# Patient Record
Sex: Female | Born: 1965 | Race: Black or African American | Hispanic: No | Smoking: Former smoker
Health system: Southern US, Community
[De-identification: ages and names within clinical notes are randomized; demographics above are authoritative.]

## PROBLEM LIST (undated history)

## (undated) VITALS — BP 108/73 | HR 98 | Temp 98.7°F | Resp 18 | Ht 69.5 in | Wt 324.0 lb

## (undated) DIAGNOSIS — G43909 Migraine, unspecified, not intractable, without status migrainosus: Secondary | ICD-10-CM

## (undated) DIAGNOSIS — T4145XA Adverse effect of unspecified anesthetic, initial encounter: Secondary | ICD-10-CM

## (undated) DIAGNOSIS — R079 Chest pain, unspecified: Secondary | ICD-10-CM

## (undated) DIAGNOSIS — E119 Type 2 diabetes mellitus without complications: Secondary | ICD-10-CM

## (undated) DIAGNOSIS — F419 Anxiety disorder, unspecified: Secondary | ICD-10-CM

## (undated) DIAGNOSIS — J45909 Unspecified asthma, uncomplicated: Secondary | ICD-10-CM

## (undated) DIAGNOSIS — F32A Depression, unspecified: Secondary | ICD-10-CM

## (undated) DIAGNOSIS — F329 Major depressive disorder, single episode, unspecified: Secondary | ICD-10-CM

## (undated) DIAGNOSIS — Z9289 Personal history of other medical treatment: Secondary | ICD-10-CM

## (undated) DIAGNOSIS — T884XXA Failed or difficult intubation, initial encounter: Secondary | ICD-10-CM

## (undated) DIAGNOSIS — T8859XA Other complications of anesthesia, initial encounter: Secondary | ICD-10-CM

## (undated) DIAGNOSIS — G4730 Sleep apnea, unspecified: Secondary | ICD-10-CM

## (undated) DIAGNOSIS — E785 Hyperlipidemia, unspecified: Secondary | ICD-10-CM

## (undated) DIAGNOSIS — R87619 Unspecified abnormal cytological findings in specimens from cervix uteri: Secondary | ICD-10-CM

## (undated) DIAGNOSIS — I10 Essential (primary) hypertension: Secondary | ICD-10-CM

## (undated) HISTORY — DX: Anxiety disorder, unspecified: F41.9

## (undated) HISTORY — DX: Depression, unspecified: F32.A

## (undated) HISTORY — DX: Essential (primary) hypertension: I10

## (undated) HISTORY — DX: Unspecified abnormal cytological findings in specimens from cervix uteri: R87.619

## (undated) HISTORY — DX: Major depressive disorder, single episode, unspecified: F32.9

## (undated) HISTORY — PX: OTHER SURGICAL HISTORY: SHX169

## (undated) HISTORY — DX: Chest pain, unspecified: R07.9

## (undated) HISTORY — DX: Type 2 diabetes mellitus without complications: E11.9

## (undated) HISTORY — PX: KNEE SURGERY: SHX244

## (undated) HISTORY — PX: TONSILLECTOMY: SUR1361

---

## 1999-02-22 ENCOUNTER — Emergency Department (HOSPITAL_COMMUNITY): Admission: EM | Admit: 1999-02-22 | Discharge: 1999-02-22 | Payer: Self-pay | Admitting: Emergency Medicine

## 2001-12-10 ENCOUNTER — Encounter: Payer: Self-pay | Admitting: Emergency Medicine

## 2001-12-10 ENCOUNTER — Emergency Department (HOSPITAL_COMMUNITY): Admission: EM | Admit: 2001-12-10 | Discharge: 2001-12-10 | Payer: Self-pay | Admitting: Emergency Medicine

## 2001-12-20 ENCOUNTER — Other Ambulatory Visit: Admission: RE | Admit: 2001-12-20 | Discharge: 2001-12-20 | Payer: Self-pay | Admitting: Family Medicine

## 2001-12-28 ENCOUNTER — Ambulatory Visit (HOSPITAL_COMMUNITY): Admission: RE | Admit: 2001-12-28 | Discharge: 2001-12-28 | Payer: Self-pay | Admitting: Family Medicine

## 2003-06-07 ENCOUNTER — Encounter: Admission: RE | Admit: 2003-06-07 | Discharge: 2003-06-26 | Payer: Self-pay | Admitting: Family Medicine

## 2003-07-11 ENCOUNTER — Emergency Department (HOSPITAL_COMMUNITY): Admission: EM | Admit: 2003-07-11 | Discharge: 2003-07-12 | Payer: Self-pay | Admitting: Emergency Medicine

## 2003-10-17 ENCOUNTER — Other Ambulatory Visit: Admission: RE | Admit: 2003-10-17 | Discharge: 2003-10-17 | Payer: Self-pay | Admitting: Family Medicine

## 2006-11-05 ENCOUNTER — Other Ambulatory Visit: Admission: RE | Admit: 2006-11-05 | Discharge: 2006-11-05 | Payer: Self-pay | Admitting: Family Medicine

## 2007-02-03 ENCOUNTER — Ambulatory Visit (HOSPITAL_COMMUNITY): Admission: RE | Admit: 2007-02-03 | Discharge: 2007-02-03 | Payer: Self-pay | Admitting: Obstetrics and Gynecology

## 2007-02-19 ENCOUNTER — Ambulatory Visit (HOSPITAL_COMMUNITY): Admission: RE | Admit: 2007-02-19 | Discharge: 2007-02-19 | Payer: Self-pay | Admitting: Family Medicine

## 2007-03-12 ENCOUNTER — Ambulatory Visit (HOSPITAL_COMMUNITY): Admission: RE | Admit: 2007-03-12 | Discharge: 2007-03-12 | Payer: Self-pay | Admitting: Obstetrics and Gynecology

## 2008-01-20 ENCOUNTER — Other Ambulatory Visit: Admission: RE | Admit: 2008-01-20 | Discharge: 2008-01-20 | Payer: Self-pay | Admitting: Family Medicine

## 2009-06-26 ENCOUNTER — Ambulatory Visit: Payer: Self-pay | Admitting: Gynecology

## 2009-10-07 IMAGING — CR DG CHEST 1V PORT
1 series · 1 of 1 positions shown · non-contrast
Comparison: none

CLINICAL DATA: Postop from hysterectomy.  Hypoxia. 
 PORTABLE CHEST ? 1 VIEW:

[view not recorded]
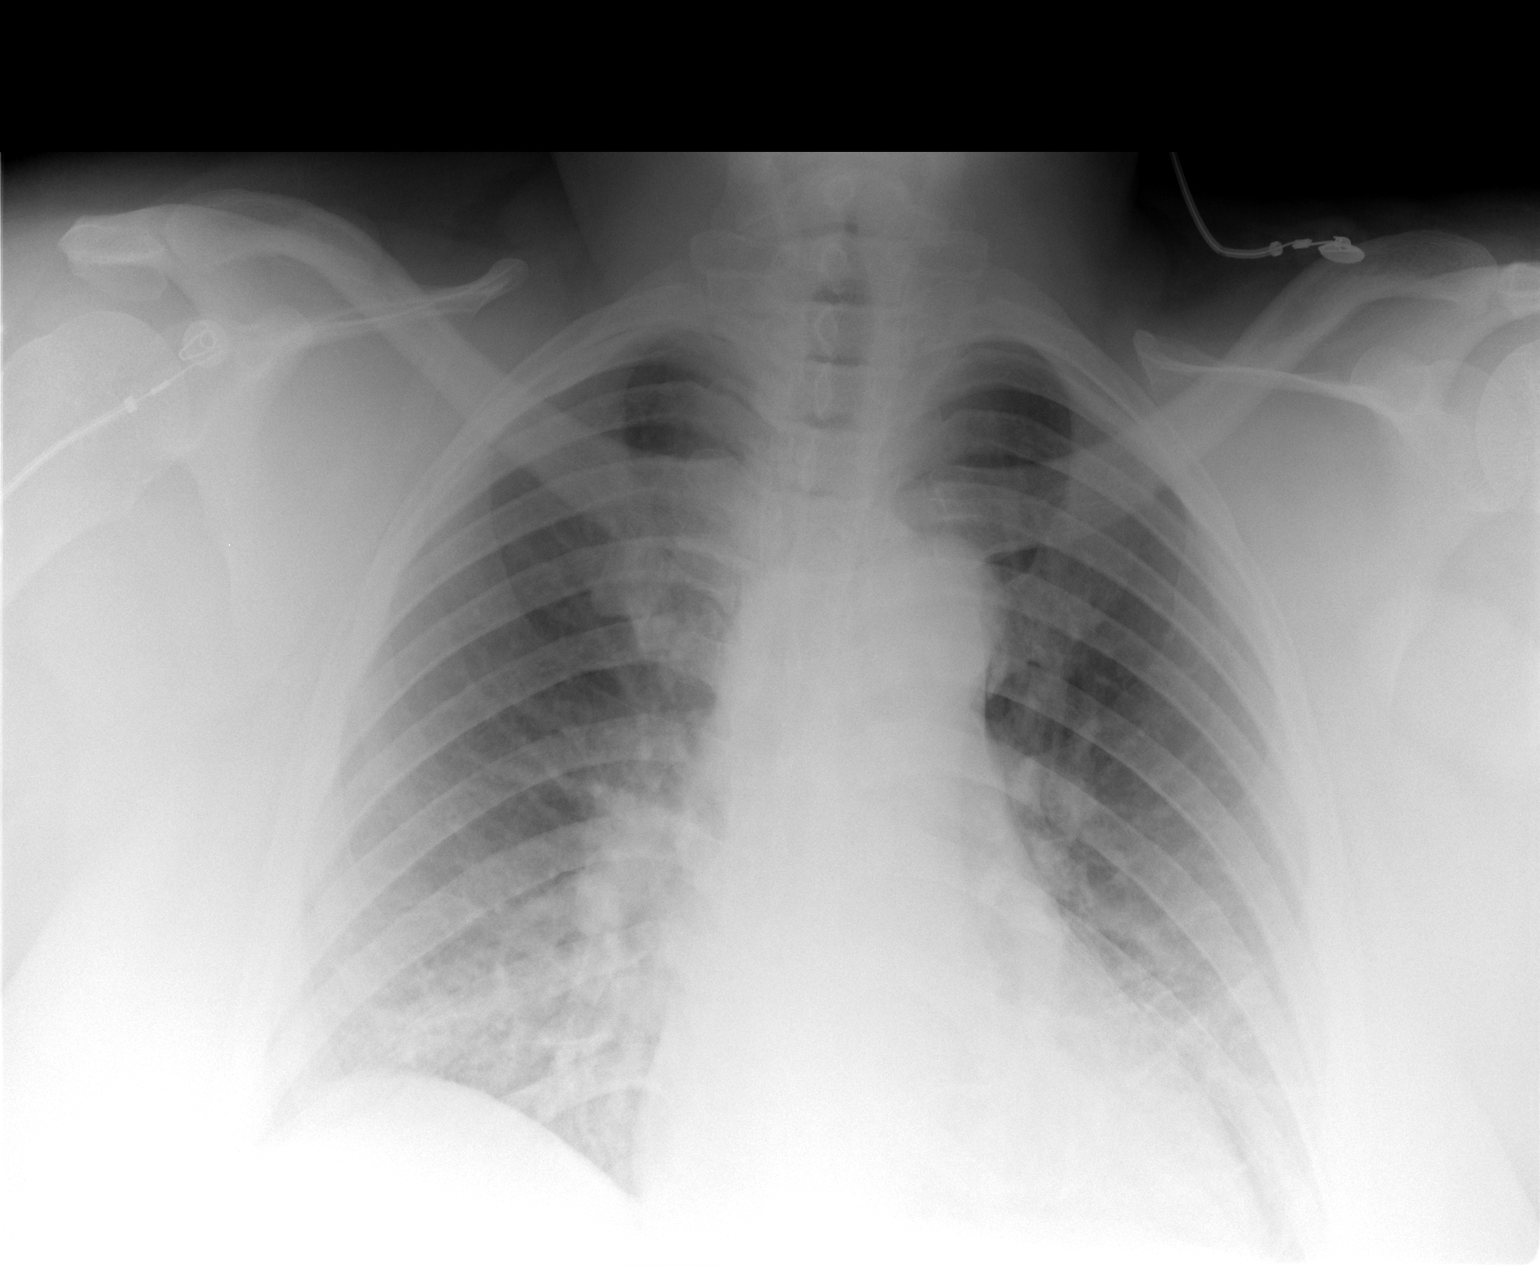

[1 of 1 positions shown; findings below may reference images not displayed]

FINDINGS: Interstitial infiltrates are seen bilaterally, mainly in the lower lung zones, suspicious for mild pulmonary edema.  There is also mild bibasilar atelectasis.  Heart size is within normal limits allowing for portable technique.
IMPRESSION: Mild interstitial edema.  Mild bibasilar atelectasis.

## 2010-02-03 ENCOUNTER — Encounter: Payer: Self-pay | Admitting: Family Medicine

## 2010-04-22 ENCOUNTER — Emergency Department (HOSPITAL_COMMUNITY)
Admission: EM | Admit: 2010-04-22 | Discharge: 2010-04-22 | Disposition: A | Discharge status: Home / Self Care | Payer: 59 | Attending: Emergency Medicine | Admitting: Emergency Medicine

## 2010-04-22 ENCOUNTER — Emergency Department (HOSPITAL_COMMUNITY): Payer: 59

## 2010-04-22 DIAGNOSIS — Z79899 Other long term (current) drug therapy: Secondary | ICD-10-CM | POA: Insufficient documentation

## 2010-04-22 DIAGNOSIS — E119 Type 2 diabetes mellitus without complications: Secondary | ICD-10-CM | POA: Insufficient documentation

## 2010-04-22 DIAGNOSIS — I10 Essential (primary) hypertension: Secondary | ICD-10-CM | POA: Insufficient documentation

## 2010-04-22 DIAGNOSIS — R079 Chest pain, unspecified: Secondary | ICD-10-CM | POA: Insufficient documentation

## 2010-04-22 DIAGNOSIS — R0602 Shortness of breath: Secondary | ICD-10-CM | POA: Insufficient documentation

## 2010-04-22 DIAGNOSIS — K219 Gastro-esophageal reflux disease without esophagitis: Secondary | ICD-10-CM | POA: Insufficient documentation

## 2010-04-22 LAB — BASIC METABOLIC PANEL
BUN: 6 mg/dL (ref 6–23)
CO2: 29 mEq/L (ref 19–32)
Chloride: 100 mEq/L (ref 96–112)
Potassium: 3.9 mEq/L (ref 3.5–5.1)

## 2010-04-22 LAB — DIFFERENTIAL
Basophils Absolute: 0 10*3/uL (ref 0.0–0.1)
Basophils Relative: 1 % (ref 0–1)
Eosinophils Absolute: 0.2 10*3/uL (ref 0.0–0.7)
Eosinophils Relative: 2 % (ref 0–5)
Lymphocytes Relative: 46 % (ref 12–46)
Lymphs Abs: 3.2 10*3/uL (ref 0.7–4.0)
Monocytes Absolute: 0.5 10*3/uL (ref 0.1–1.0)
Monocytes Relative: 7 % (ref 3–12)
Neutro Abs: 3 10*3/uL (ref 1.7–7.7)
Neutrophils Relative %: 44 % (ref 43–77)

## 2010-04-22 LAB — BRAIN NATRIURETIC PEPTIDE: Pro B Natriuretic peptide (BNP): <30 pg/mL (ref 0.0–100.0)

## 2010-04-22 LAB — CBC
HCT: 41 % (ref 36.0–46.0)
Hemoglobin: 13.2 g/dL (ref 12.0–15.0)
MCH: 27.4 pg (ref 26.0–34.0)
MCHC: 32.2 g/dL (ref 30.0–36.0)
MCV: 85.1 fL (ref 78.0–100.0)
Platelets: 260 10*3/uL (ref 150–400)
RBC: 4.82 MIL/uL (ref 3.87–5.11)
RDW: 14.5 % (ref 11.5–15.5)
WBC: 6.9 10*3/uL (ref 4.0–10.5)

## 2010-04-22 LAB — POCT CARDIAC MARKERS: Troponin i, poc: <0.05 ng/mL (ref 0.00–0.09)

## 2010-05-28 NOTE — Op Note (Signed)
Nicole Hubbard, Nicole Hubbard NO.:  [id number]   MEDICAL RECORD NO.:  [id number]          PATIENT TYPE:  AMB   LOCATION:  SDC                           [facility]   PHYSICIAN:  Gerald Leitz, MD          DATE OF BIRTH:  Dec 04, 1965   DATE OF PROCEDURE:  03/12/2007  DATE OF DISCHARGE:  02/19/2007                               OPERATIVE REPORT   PREOPERATIVE DIAGNOSIS:  Menorrhagia.   POSTOPERATIVE DIAGNOSIS:  Menorrhagia.   PROCEDURE:  Hysteroscopy, NovaSure endometrial ablation.   SURGEON:  Gerald Leitz, M.D.   ASSISTANT:  None.   ANESTHESIA:  General.   FINDINGS:  Normal appearing uterine cavity.   SPECIMENS:  None.   ESTIMATED BLOOD LOSS:  Minimal.   LR DEFICIT:  100 mL.   COMPLICATIONS:  None   PROCEDURE IN DETAIL:  Informed consent was obtained and the patient was  taken to the operating room where she was placed under general  anesthesia.  She was then placed in the dorsal lithotomy position,  prepped and draped in the usual sterile fashion.  A bivalve speculum was  placed into the vaginal vault and the anterior lip of the cervix was  grasped with a single tooth tenaculum.  The uterus was sounded to 9 cm.  Endocervical canal length was 3 cm for a cavity length of 6 cm. The  hysteroscope was then inserted with the findings noted above.  The  hysteroscope was removed and the cervix was dilated to approximately 8  mm.  The NovaSure apparatus was set at 6 cm.  The NovaSure apparatus was  inserted into the cervix and endometrial cavity.  The NovaSure array was  deployed.  It was then seated. Cavity width was noted to be 3.5 cm.  CO2  cavity assessment test was performed and passed. Ablation was performed  for 1 minute 45 seconds at a power of 116. The NovaSure apparatus was  removed from the endometrial cavity.  The hysteroscope was inserted and  the endometrial cavity appeared adequately ablated.  0.25% Marcaine were  injected, 5 mL, at the 5 o'clock position  and 7 o'clock position for  postop anesthesia.  The single  tooth sac was removed from the anterior lip of the cervix.  The fascia  was removed from the vagina.  The patient was awakened from anesthesia  and taken to the recovery room awake and in stable condition.  Sponge,  lap, and needle counts were correct x2.      Gerald Leitz, MD  Electronically Signed     TC/MEDQ  D:  03/12/2007  T:  03/12/2007  Job:  [id number]

## 2010-05-28 NOTE — H&P (Signed)
NAMEBERNISHA, Nicole Hubbard NO.:  [id number]   MEDICAL RECORD NO.:  [id number]          PATIENT TYPE:  AMB   LOCATION:  SDC                           [facility]   PHYSICIAN:  Gerald Leitz, MD          DATE OF BIRTH:  09/03/1965   DATE OF ADMISSION:  02/19/2007  DATE OF DISCHARGE:  02/19/2007                              HISTORY & PHYSICAL   DATE OF SURGERY:  March 12, 2007   HISTORY OF PRESENT ILLNESS:  This is a 45 year old G2, P1-0-1-1 with  menorrhagia who desires therapy via endometrial ablation.  She had an  endometrial biopsy performed on November 05, 2006 which showed  nonsecretory endometrium with breakdown.  There was no hyperplasia or  malignancy identified.  The patient was originally scheduled for surgery  on February 03, 2007.  She had problems with anesthesia during that  visit, and therefore the ablation was cancelled until further evaluation  could be performed.  She underwent pulmonary function testing on  February 19, 2007.  She had normal at spirometry.   PAST OB HISTORY:  Spontaneous vaginal delivery x1, SAB x1.   PAST GYN HISTORY:  Menarche at the age of 39.  Contraception:  None.  No  history of sexually transmitted diseases.  Last Pap smear was in October  of 2008.  This was normal.   PAST MEDICAL HISTORY:  1. Morbid obesity.  2. Hypertension.  3. Depression.  4. Diabetes.  5. Sleep apnea.   PAST SURGICAL HISTORY:  1. Knee surgery in 2007.  2. Tonsillectomy.   MEDICATIONS:  Diovan, fluoxetine, Norvasc, metformin 500 mg b.i.d.   ALLERGIES:  NO KNOWN DRUG ALLERGIES.   SOCIAL HISTORY:  The patient works for [employer].  Previous history of  tobacco use.  She quit 7 months ago.  Social alcohol use.  No illicit  drug use.   FAMILY HISTORY:  Negative for breast, ovarian, or colon cancer.   REVIEW OF SYSTEMS:  Negative, except as stated in history of current  illness.   PHYSICAL EXAMINATION:  VITAL SIGNS:  Blood pressure 140/90, height 6  feet 9 inches, weight 377.5 pounds.  CARDIOVASCULAR:  Regular rate and rhythm.  LUNGS:  Clear to auscultation bilaterally.  ABDOMEN:  Soft, nontender, no masses.  PELVIC:  Normal external female genitalia.  No vulvar, vaginal, or  cervical lesions noted.  Bimanual exam is difficult due to the patient's  body habitus.  No gross masses are appreciated.  EXTREMITIES:  No clubbing, cyanosis, or edema.   Pelvic ultrasound performed on November 18, 2006 shows the uterus is  measured at 9.9 cm x 4.7 cm x 6.6 cm.   IMPRESSION AND PLAN:  Menorrhagia.  The patient desires treatment with  endometrial ablation.  Risks, benefits, alternatives to hysteroscopy,  D&C, and ablation were discussed with the patient including but not  limited to infection, bleeding, possible damage to surrounding organs,  uterine perforation with the need for further surgery.  The patient  understands the risks and desires to proceed with hysteroscopy, D&C, and  endometrial ablation.  Gerald Leitz, MD  Electronically Signed     TC/MEDQ  D:  03/11/2007  T:  03/11/2007  Job:  [id number]

## 2010-05-28 NOTE — H&P (Signed)
Nicole Hubbard, Nicole Hubbard NO.:  [id number]   MEDICAL RECORD NO.:  [id number]          PATIENT TYPE:  AMB   LOCATION:  SDC                           [facility]   PHYSICIAN:  Gerald Leitz, MD          DATE OF BIRTH:  1965/05/21   DATE OF ADMISSION:  02/03/2007  DATE OF DISCHARGE:                              HISTORY & PHYSICAL   HISTORY OF PRESENT ILLNESS:  This is a 45 year old G __________ , P1-0-1-  1 with menorrhagia who desires therapy via endometrial ablation.  She  had an endometrial biopsy performed on October 23 which showed  nonsecretory endometrium with breakdown.  There was no hyperplasia or  malignancy identified.   PAST OB HISTORY:  1. Spontaneous vaginal delivery x1.  2. SAB x1.   __________  Menarche at the age of 11.   CONTRACEPTION:  None.   No history of sexually transmitted diseases.   PAST MEDICAL HISTORY:  1. Hypertension.  2. Depression.   PAST SURGICAL HISTORY:  1. Knee surgery in 2000.  2. Tonsillectomy.   MEDICATIONS:  Diovan, fluoxetine, and Norvasc.   ALLERGIES:  No known drug allergies.   SOCIAL HISTORY:  Patient works for [employer].  Previous history of tobacco  use.  She quit six months ago.  Social alcohol use.  No illicit drug  use.   FAMILY HISTORY:  Negative for breast, ovarian, and colon cancer.   REVIEW OF SYSTEMS:  Negative except as stated in the history of current  illness.   PHYSICAL EXAMINATION:  Blood pressure 130/90.  Weight 341 pounds.  Height 69 inches.  CARDIOVASCULAR:  Regular rate and rhythm.  LUNGS:  Clear to auscultation bilaterally.  ABDOMEN:  Soft, nontender.  No mass.  PELVIC:  Normal external female genitalia.  No vulvar, vaginal, or  cervical lesions noted.  Bimanual exam is difficult due to patient's  body habitus.  No gross masses are appreciated.  EXTREMITIES:  No clubbing, cyanosis or edema.   Pelvic ultrasound performed on November 18, 2006 shows the uterus to  measure 9.9 x 4.7 x  6.6  cm.   IMPRESSION/PLAN:  Menorrhagia:  Patient desires treatment with  endometrial ablation.  The risks, benefits and alternatives of  hysteroscopy, D&C, and ablation were discussed with the patient,  including but not limited to infection, bleeding, damage, possible  uterine perforation with need for further surgery.  Patient understands  the risks and desires to proceed with hysteroscopy, D&C, and endometrial  ablation.      Gerald Leitz, MD  Electronically Signed     TC/MEDQ  D:  01/29/2007  T:  01/29/2007  Job:  [id number]

## 2010-10-03 LAB — BASIC METABOLIC PANEL
BUN: 9
Chloride: 98
Potassium: 3.4 — ABNORMAL LOW

## 2010-10-03 LAB — URINALYSIS, ROUTINE W REFLEX MICROSCOPIC
Bilirubin Urine: NEGATIVE
Glucose, UA: NEGATIVE
Ketones, ur: NEGATIVE
Protein, ur: NEGATIVE

## 2010-10-03 LAB — CBC
HCT: 38.2
MCV: 84.3
Platelets: 331
RBC: 4.53
WBC: 7.5

## 2010-10-03 LAB — DIFFERENTIAL
Eosinophils Absolute: 0.2
Eosinophils Relative: 3
Lymphs Abs: 3.1
Monocytes Relative: 9

## 2010-10-04 LAB — BASIC METABOLIC PANEL
CO2: 30
Chloride: 99
GFR calc Af Amer: >60
Sodium: 138

## 2010-10-04 LAB — URINALYSIS, ROUTINE W REFLEX MICROSCOPIC
Glucose, UA: NEGATIVE
Hgb urine dipstick: NEGATIVE
Protein, ur: NEGATIVE
pH: 6

## 2010-10-04 LAB — CBC
Hemoglobin: 13.1
MCHC: 33.5
MCV: 84
RBC: 4.64

## 2010-10-04 LAB — PREGNANCY, URINE: Preg Test, Ur: NEGATIVE

## 2010-12-29 ENCOUNTER — Ambulatory Visit: Payer: 59

## 2010-12-29 DIAGNOSIS — F4321 Adjustment disorder with depressed mood: Secondary | ICD-10-CM

## 2010-12-29 DIAGNOSIS — J301 Allergic rhinitis due to pollen: Secondary | ICD-10-CM

## 2011-01-27 ENCOUNTER — Encounter: Payer: Self-pay | Admitting: Family Medicine

## 2011-02-10 ENCOUNTER — Encounter: Payer: 59 | Admitting: Family Medicine

## 2011-04-24 ENCOUNTER — Other Ambulatory Visit: Payer: Self-pay | Admitting: *Deleted

## 2011-04-25 ENCOUNTER — Encounter: Payer: Self-pay | Admitting: Family Medicine

## 2011-06-17 ENCOUNTER — Telehealth: Payer: Self-pay

## 2011-06-17 NOTE — Telephone Encounter (Addendum)
PT WOULD LIKE TO SPEAK WITH DR COPLAND REGARDING HER FMLA PAPERS. STATES SHE WAS GIVEN TIME OUT OF WORK FOR DR'S APPT, HOWEVER SHE IS SEEING A COUNSELOR ON LINE THAT LIVE IN [place]. WOULD LIKE DR COPLAND TO GIVE HER THAT TIME ALSO. PLEASE CALL [phone]   PT CALLED AGAIN TODAY AND STATED SHE HADN'T HEARD FROM DR COPLAND AND WOULD LIKE TO HEAR SOMETHING BEFORE THE DAY IS OVER PLEASE CALL [phone]

## 2011-06-18 NOTE — Telephone Encounter (Signed)
Called pt and let her know that Dr Patsy Lager will not be back in office until Monday, and we will be back in touch w/her after she has a chance to review message. Pt agreed.

## 2011-06-19 NOTE — Telephone Encounter (Signed)
Chart in your box 

## 2011-06-19 NOTE — Telephone Encounter (Signed)
Pt CB and reports that the only thing that is still needed on the FMLA papers about her counseling w/Michelle is the last office visit date when she last saw Marcelino Duster in person here in [place] bf she moved to [facility]. Dr Patsy Lager has already put on the forms that she has been continuing counseling online. The last OV date with Marcelino Duster was on 02/25/11.

## 2011-06-19 NOTE — Telephone Encounter (Signed)
I will need to look at her chart because I don't think we have seen here in a few months at least.  (no notes on EPIC).  If she can tell me exactly what times (or how much time) she needs to do her online concealing I can update her paperwork for her.    Medical records- please pull this chart for me!  thanks

## 2011-06-19 NOTE — Telephone Encounter (Signed)
LMOM for pt to CB. Ask pt Dr Cyndie Chime question about what times or how much time is needed for her online counseling and then forward message back to Dr Patsy Lager.

## 2011-06-24 ENCOUNTER — Telehealth: Payer: Self-pay

## 2011-09-15 ENCOUNTER — Other Ambulatory Visit: Payer: Self-pay | Admitting: Family Medicine

## 2011-09-15 NOTE — Telephone Encounter (Signed)
Please pull paper chart.  

## 2011-09-16 NOTE — Telephone Encounter (Signed)
Chart pulled to PA pool at nurse station [phone]

## 2012-01-26 ENCOUNTER — Ambulatory Visit: Payer: 59 | Admitting: Family Medicine

## 2012-01-26 VITALS — BP 162/112 | HR 91 | Temp 99.0°F | Resp 18 | Ht 69.0 in | Wt 333.0 lb

## 2012-01-26 DIAGNOSIS — E119 Type 2 diabetes mellitus without complications: Secondary | ICD-10-CM

## 2012-01-26 DIAGNOSIS — F329 Major depressive disorder, single episode, unspecified: Secondary | ICD-10-CM

## 2012-01-26 DIAGNOSIS — E669 Obesity, unspecified: Secondary | ICD-10-CM

## 2012-01-26 DIAGNOSIS — I10 Essential (primary) hypertension: Secondary | ICD-10-CM | POA: Insufficient documentation

## 2012-01-26 DIAGNOSIS — N760 Acute vaginitis: Secondary | ICD-10-CM

## 2012-01-26 DIAGNOSIS — Z0000 Encounter for general adult medical examination without abnormal findings: Secondary | ICD-10-CM

## 2012-01-26 DIAGNOSIS — Z01419 Encounter for gynecological examination (general) (routine) without abnormal findings: Secondary | ICD-10-CM

## 2012-01-26 DIAGNOSIS — R300 Dysuria: Secondary | ICD-10-CM

## 2012-01-26 DIAGNOSIS — F32A Depression, unspecified: Secondary | ICD-10-CM | POA: Insufficient documentation

## 2012-01-26 DIAGNOSIS — E7800 Pure hypercholesterolemia, unspecified: Secondary | ICD-10-CM

## 2012-01-26 DIAGNOSIS — Z23 Encounter for immunization: Secondary | ICD-10-CM

## 2012-01-26 DIAGNOSIS — B9689 Other specified bacterial agents as the cause of diseases classified elsewhere: Secondary | ICD-10-CM

## 2012-01-26 LAB — POCT URINALYSIS DIPSTICK
Bilirubin, UA: NEGATIVE
Ketones, UA: NEGATIVE
Leukocytes, UA: NEGATIVE
Nitrite, UA: NEGATIVE
Protein, UA: NEGATIVE
pH, UA: 5.5

## 2012-01-26 LAB — POCT CBC
Granulocyte percent: 41.7 %G (ref 37–80)
Lymph, poc: 4 — AB (ref 0.6–3.4)
MCH, POC: 27 pg (ref 27–31.2)
MCHC: 30.3 g/dL — AB (ref 31.8–35.4)
MCV: 88.9 fL (ref 80–97)
MID (cbc): 0.5 (ref 0–0.9)
POC LYMPH PERCENT: 52.3 %L — AB (ref 10–50)
Platelet Count, POC: 288 10*3/uL (ref 142–424)
RDW, POC: 15 %

## 2012-01-26 LAB — POCT WET PREP WITH KOH

## 2012-01-26 LAB — POCT UA - MICROSCOPIC ONLY: Yeast, UA: NEGATIVE

## 2012-01-26 MED ORDER — DESVENLAFAXINE SUCCINATE ER 50 MG PO TB24: 50.0000 mg | ORAL_TABLET | Freq: Every day | ORAL | Status: DC

## 2012-01-26 MED ORDER — LOSARTAN POTASSIUM-HCTZ 100-25 MG PO TABS: 1.0000 | ORAL_TABLET | Freq: Every day | ORAL | Status: DC

## 2012-01-26 MED ORDER — AMLODIPINE BESYLATE 10 MG PO TABS: 10.0000 mg | ORAL_TABLET | Freq: Every day | ORAL | Status: DC

## 2012-01-26 MED ORDER — SIMVASTATIN 10 MG PO TABS: 10.0000 mg | ORAL_TABLET | Freq: Every day | ORAL | Status: DC

## 2012-01-26 MED ORDER — METRONIDAZOLE 500 MG PO TABS: 500.0000 mg | ORAL_TABLET | Freq: Two times a day (BID) | ORAL | Status: DC

## 2012-01-26 MED ORDER — METFORMIN HCL 500 MG PO TABS: 500.0000 mg | ORAL_TABLET | Freq: Two times a day (BID) | ORAL | Status: DC

## 2012-01-26 NOTE — Progress Notes (Signed)
[facility] [address], [place] [address] [phone]  Date:  01/26/2012   Name:  Nicole Hubbard   DOB:  March 21, 1965   MRN:  [id number]  PCP:  Abbe Amsterdam, MD    Chief Complaint: Annual Exam   History of Present Illness:  Nicole Hubbard is a 47 y.o. very pleasant female patient who presents with the following:  History of HTN, DM and depression.  Here for a CPE today.  Last seen here December 2012.  She has not been keeping up with her health maintenance as of late.   She has checked off 19 positives on her ROS.    She states that nothing is really new since we saw each other last.  She states that she is still depressed.  She restarted smoking about 6 months ago.  She usually smokes after work to help deal with stress.  Sometimes she smokes to help herself avoid drinking.  She is drinking mostly on the weekends- she might drink 2 bottles of wine on Friday and sat nights (each), and then a glass or two on weekdays.    She ran out of all of her medications a couple of weeks ago.  She is supposed to be on norvasc and hyzaar for BP control.  She would like to try something different for her depression- she has tried wellbutrin and prozac alternatively.    She would like to have a pap today.  She had an ASCUS pap and positive HRHPV in 2011 and was sent to [facility] for colpo, and they recommended paps every 6 months.  If any high grade changes will need repeat colpo.   She has not had a mammogram in some time either- last mammo I found is from 2009  She does not have any suicidal thoughts or plans, but admits that she does not care about herself as much as she should.  She had been seeing a Veterinary surgeon but she moved away to [place].   She would like to get back into counseling.    Hira wants to reinstate her FMLA leave- she had used this for depression in the past.    Also needs a flu shot today There is no problem list on file for this patient.   Past  Medical History  Diagnosis Date  . Depression   . Diabetes mellitus without complication   . Hypertension     Past Surgical History  Procedure Date  . Knee surgery     History  Substance Use Topics  . Smoking status: Current Every Day Smoker  . Smokeless tobacco: Not on file  . Alcohol Use: Yes    Family History  Problem Relation Age of Onset  . Cancer Father     No Known Allergies  Medication list has been reviewed and updated.  Current Outpatient Prescriptions on File Prior to Visit  Medication Sig Dispense Refill  . amLODipine (NORVASC) 10 MG tablet Take 10 mg by mouth daily.      Marland Kitchen buPROPion (WELLBUTRIN SR) 150 MG 12 hr tablet TAKE 1 TABLET BY MOUTH DAILY FOR 3 DAYS ONE TABLET BY MOUTH TWICEDAILY  60 tablet  0  . metFORMIN (GLUCOPHAGE) 500 MG tablet Take 500 mg by mouth 2 (two) times daily with a meal.      . simvastatin (ZOCOR) 10 MG tablet Take 10 mg by mouth at bedtime.        Review of Systems:  As per HPI-  otherwise negative.   Physical Examination: Filed Vitals:   01/26/12 1349  BP: 162/112  Pulse: 91  Temp: 99 F (37.2 C)  Resp: 18   Filed Vitals:   01/26/12 1349  Height: 5\' 9"  (1.753 m)  Weight: 333 lb (151.048 kg)   Body mass index is 49.18 kg/(m^2). Ideal Body Weight: Weight in (lb) to have BMI = 25: 168.9   GEN: WDWN, NAD, Non-toxic, A & O x 3, morbid obesity HEENT: Atraumatic, Normocephalic. Neck supple. No masses, No LAD. Bilateral TM wnl, oropharynx normal.  PEERL,EOMI.   Ears and Nose: No external deformity. CV: RRR, No M/G/R. No JVD. No thrill. No extra heart sounds. PULM: CTA B, no wheezes, crackles, rhonchi. No retractions. No resp. distress. No accessory muscle use. ABD: S, NT, ND, +BS. No rebound. No HSM. EXTR: No c/c/e NEURO Normal gait.  PSYCH: Normally interactive. Conversant. Not depressed or anxious appearing.  Calm demeanor.  Breast exam: no masses or discharge GU:  Normal internal and external exam except for fishy  odor.  No CMT or adnexal masses or tenderness.   Results for orders placed in visit on 01/26/12  POCT CBC      Component Value Range   WBC 7.7  4.6 - 10.2 K/uL   Lymph, poc 4.0 (*) 0.6 - 3.4   POC LYMPH PERCENT 52.3 (*) 10 - 50 %L   MID (cbc) 0.5  0 - 0.9   POC MID % 6.0  0 - 12 %M   POC Granulocyte 3.2  2 - 6.9   Granulocyte percent 41.7  37 - 80 %G   RBC 5.26  4.04 - 5.48 M/uL   Hemoglobin 14.2  12.2 - 16.2 g/dL   HCT, POC 16.1  09.6 - 47.9 %   MCV 88.9  80 - 97 fL   MCH, POC 27.0  27 - 31.2 pg   MCHC 30.3 (*) 31.8 - 35.4 g/dL   RDW, POC 04.5     Platelet Count, POC 288  142 - 424 K/uL   MPV 12.2  0 - 99.8 fL  POCT GLYCOSYLATED HEMOGLOBIN (HGB A1C)      Component Value Range   Hemoglobin A1C 6.8    POCT WET PREP WITH KOH      Component Value Range   Trichomonas, UA Negative     Clue Cells Wet Prep HPF POC 3-4     Epithelial Wet Prep HPF POC 3-6     Yeast Wet Prep HPF POC neg     Bacteria Wet Prep HPF POC trace     RBC Wet Prep HPF POC 0-1     WBC Wet Prep HPF POC 0-1     KOH Prep POC Negative    POCT UA - MICROSCOPIC ONLY      Component Value Range   WBC, Ur, HPF, POC 0-2     RBC, urine, microscopic 0-1     Bacteria, U Microscopic trace     Mucus, UA neg     Epithelial cells, urine per micros 2-4     Crystals, Ur, HPF, POC neg     Casts, Ur, LPF, POC neg     Yeast, UA neg    POCT URINALYSIS DIPSTICK      Component Value Range   Color, UA yellow     Clarity, UA clear     Glucose, UA neg     Bilirubin, UA neg     Ketones, UA neg  Spec Grav, UA 1.025     Blood, UA neg     pH, UA 5.5     Protein, UA neg     Urobilinogen, UA 0.2     Nitrite, UA neg     Leukocytes, UA Negative      Assessment and Plan: 1. Physical exam, routine  POCT CBC, TSH, Pap IG, CT/NG w/ reflex HPV when ASC-U  2. Depression  desvenlafaxine (PRISTIQ) 50 MG 24 hr tablet  3. Diabetes mellitus, type 2  POCT glycosylated hemoglobin (Hb A1C), Comprehensive metabolic panel, Lipid panel,  metFORMIN (GLUCOPHAGE) 500 MG tablet, simvastatin (ZOCOR) 10 MG tablet  4. HTN (hypertension)  Comprehensive metabolic panel, losartan-hydrochlorothiazide (HYZAAR) 100-25 MG per tablet, amLODipine (NORVASC) 10 MG tablet  5. Obesity  simvastatin (ZOCOR) 10 MG tablet  6. Dysuria  POCT Wet Prep with KOH, POCT UA - Microscopic Only, POCT urinalysis dipstick  7. High cholesterol  simvastatin (ZOCOR) 10 MG tablet  8. BV (bacterial vaginosis)  metroNIDAZOLE (FLAGYL) 500 MG tablet   Completed PE today, await pap and other labs as above.  Flu shot, other immunizations UTD DM: control is ok.   Will start pristiq for depression.  She does not have any suicidal ideas, but is not happy with her mood or current lifestyle. Admits that she is drinking and smoking to deal with her depression- she is dedicated to stopping drinking.  She plans to look again at counseling through her work.  She may go back on FMLA for a few months- this will allow her some time out of work to attend counseling Restart both BP medications Discussed bariatric surgery- she is interested in this and will look into the informational session at [facility].  UA is reassuring Restart zocor, await FLP Gave rx for flagyl for BV.  However, she has a drinking problem at this time.  Advised her to not fill this rx as of yet.  Will await her pap result and wait for a time when she is not drinking as she does not have any symptoms.    Will plan further follow- up pending labs.   Abbe Amsterdam, MD

## 2012-01-26 NOTE — Patient Instructions (Addendum)
If you are interested in looking at medical weight loss or bariatric surgery, call and set up a time to attend the bariatric seminar through [facility]:  We congratulate you for exploring weight-loss surgery as an option for better health. Choosing to undergo a bariatric procedure is a big decision. We want you to have all the information you need to evaluate the advantages and obligations of this life-changing transition.  You can join the thousands of [facility] patients who have reached their weight-loss goals. The next step is simple: Register to attend our free seminar.  Register today online or contact [facility] at [phone] to sign up for our next Bariatric Surgery Options Seminar.   I think you need to see a new counselor- talk to you job about this.  We can do FMLA for you to attend counseling   We are going to try Pristiq for your depression symptoms.  You just take one pill a day  If you like you can decrease your metformin to once a day  Restart your cholesterol, HTN and diabetes medications.    Remember to schedule a mammogram some time this spring

## 2012-01-27 LAB — LIPID PANEL
Cholesterol: 219 mg/dL — ABNORMAL HIGH (ref 0–200)
HDL: 48 mg/dL (ref >39)
LDL Cholesterol: 151 mg/dL — ABNORMAL HIGH (ref 0–99)
Total CHOL/HDL Ratio: 4.6 Ratio
Triglycerides: 101 mg/dL (ref <150)
VLDL: 20 mg/dL (ref 0–40)

## 2012-01-27 LAB — COMPREHENSIVE METABOLIC PANEL
Alkaline Phosphatase: 60 U/L (ref 39–117)
BUN: 11 mg/dL (ref 6–23)
CO2: 25 mEq/L (ref 19–32)
Creat: 0.93 mg/dL (ref 0.50–1.10)
Glucose, Bld: 114 mg/dL — ABNORMAL HIGH (ref 70–99)
Sodium: 140 mEq/L (ref 135–145)
Total Bilirubin: 0.3 mg/dL (ref 0.3–1.2)

## 2012-01-27 LAB — PAP IG, CT-NG, RFX HPV ASCU
Chlamydia Probe Amp: NEGATIVE
GC Probe Amp: NEGATIVE

## 2012-01-30 ENCOUNTER — Encounter: Payer: Self-pay | Admitting: Family Medicine

## 2012-01-30 ENCOUNTER — Telehealth: Payer: Self-pay | Admitting: Family Medicine

## 2012-01-30 NOTE — Telephone Encounter (Signed)
Reviewed her chart- Dr. Audie Box did her colpo 06/2009- he recommended paps q 6 months.  Did not saw when to d/c q 6 month paps

## 2012-01-30 NOTE — Telephone Encounter (Signed)
Message copied by Pearline Cables on Fri Jan 30, 2012  7:58 PM ------      Message from: Celine Mans      Created: Fri Jan 30, 2012 10:28 AM       Chart in your box [id number]      ----- Message -----         From: Pearline Cables, MD         Sent: 01/30/2012   7:54 AM           To: [facility] Records            Can I have her paper chart please?  Thanks!

## 2012-06-30 ENCOUNTER — Ambulatory Visit: Payer: 59

## 2012-06-30 ENCOUNTER — Ambulatory Visit: Payer: 59 | Admitting: Family Medicine

## 2012-06-30 VITALS — BP 124/90 | HR 77 | Temp 98.3°F | Resp 18 | Wt 331.0 lb

## 2012-06-30 DIAGNOSIS — F329 Major depressive disorder, single episode, unspecified: Secondary | ICD-10-CM

## 2012-06-30 DIAGNOSIS — R079 Chest pain, unspecified: Secondary | ICD-10-CM

## 2012-06-30 DIAGNOSIS — R0602 Shortness of breath: Secondary | ICD-10-CM

## 2012-06-30 DIAGNOSIS — E108 Type 1 diabetes mellitus with unspecified complications: Secondary | ICD-10-CM

## 2012-06-30 DIAGNOSIS — R12 Heartburn: Secondary | ICD-10-CM

## 2012-06-30 LAB — POCT CBC
HCT, POC: 42.5 % (ref 37.7–47.9)
Hemoglobin: 13 g/dL (ref 12.2–16.2)
Lymph, poc: 2.9 (ref 0.6–3.4)
MCHC: 30.6 g/dL — AB (ref 31.8–35.4)
MCV: 91.1 fL (ref 80–97)
POC Granulocyte: 4.3 (ref 2–6.9)
POC LYMPH PERCENT: 36.6 %L (ref 10–50)
RDW, POC: 15.4 %
WBC: 8 10*3/uL (ref 4.6–10.2)

## 2012-06-30 LAB — POCT GLYCOSYLATED HEMOGLOBIN (HGB A1C): Hemoglobin A1C: 7.3

## 2012-06-30 MED ORDER — DESVENLAFAXINE SUCCINATE ER 50 MG PO TB24: 50.0000 mg | ORAL_TABLET | Freq: Every day | ORAL | Status: DC

## 2012-06-30 NOTE — Progress Notes (Signed)
[facility] [address], [place] [address] [phone]  Date:  06/30/2012   Name:  Nicole Hubbard   DOB:  1965-04-04   MRN:  [id number]  PCP:  Abbe Amsterdam, MD    Chief Complaint: rx refill   History of Present Illness:  Nicole Hubbard is a 47 y.o. very pleasant female patient who presents with the following:  She is here today for a recheck.  Last here in January 2014 at which time her A1c was 6.8%.   She did pursue contacting the bariatric clinic - next week she has a consultation appt.  She is interested in getting weight loss surgery.   She needs a refill of her pristiq today.  We will also recheck her A1c today.  States that she only came in for refills today, but has actually noted some other issues: She notes SOB especially with walking or climbing stairs.  She can make it up two flights of steps before she has to rest.  She can walk about 100 years on flat ground and then will get SOB.  She has noted this SOB for at least 6 months.  She may have CP with walking or climbing stairs- this does not occur all the time, but will occur if she "pushes myself" more. When she rests the pain will resolve after 90 minutes.  She also had a CP evaluation at the ED ([facility]) 2 years ago which looked ok.  Last incident of CP was 2 weeks ago.  She has never had a cardiac evaluation such as a treadmill test that she can recall.    She sometimes has a cough, and has started nicotine again-  She uses an e cigarette now, but only on occasion.  She does not use it every day.  She has been sober from alcohol for about 4 months.    She also notes that she will get heartburn after eating- she may note epigastric pain and heartburn after eating.  She has not tried any medications for this so far.     She had an endometrial ablation in 2009- no menses since then.  She is not on any hormones, and has never had a DVT or PE.    Patient Active Problem List   Diagnosis Date Noted  .  Depression 01/26/2012  . Diabetes mellitus, type 2 01/26/2012  . HTN (hypertension) 01/26/2012    Past Medical History  Diagnosis Date  . Depression   . Diabetes mellitus without complication   . Hypertension     Past Surgical History  Procedure Laterality Date  . Knee surgery      History  Substance Use Topics  . Smoking status: Current Every Day Smoker  . Smokeless tobacco: Not on file  . Alcohol Use: Yes    Family History  Problem Relation Age of Onset  . Cancer Father     No Known Allergies  Medication list has been reviewed and updated.  Current Outpatient Prescriptions on File Prior to Visit  Medication Sig Dispense Refill  . amLODipine (NORVASC) 10 MG tablet Take 1 tablet (10 mg total) by mouth daily.  30 tablet  11  . desvenlafaxine (PRISTIQ) 50 MG 24 hr tablet Take 1 tablet (50 mg total) by mouth daily.  30 tablet  11  . losartan-hydrochlorothiazide (HYZAAR) 100-25 MG per tablet Take 1 tablet by mouth daily.  30 tablet  11  . metFORMIN (GLUCOPHAGE) 500 MG tablet Take 1 tablet (  500 mg total) by mouth 2 (two) times daily with a meal.  60 tablet  11  . simvastatin (ZOCOR) 10 MG tablet Take 1 tablet (10 mg total) by mouth at bedtime.  30 tablet  11   No current facility-administered medications on file prior to visit.    Review of Systems:  As per HPI- otherwise negative.   Physical Examination: Filed Vitals:   06/30/12 1315  BP: 124/90  Pulse: 77  Temp: 98.3 F (36.8 C)  Resp: 18   Filed Vitals:   06/30/12 1315  Weight: 331 lb (150.141 kg)   Body mass index is 48.86 kg/(m^2). Ideal Body Weight:    GEN: WDWN, NAD, Non-toxic, A & O x 3, morbid obestiy.   HEENT: Atraumatic, Normocephalic. Neck supple. No masses, No LAD. Ears and Nose: No external deformity. CV: RRR, No M/G/R. No JVD. No thrill. No extra heart sounds. PULM: CTA B, no wheezes, crackles, rhonchi. No retractions. No resp. distress. No accessory muscle use. ABD: S, ND, +BS. No  rebound. No HSM.  Mild epigastric tenderness EXTR: No c/c/e NEURO Normal gait.  PSYCH: Normally interactive. Conversant. Not depressed or anxious appearing.  Calm demeanor.  No swelling or tenderness of her calves bilaterally  EKG: no ST elevation or depression.  Low voltage in limb leads.  Compared to EKG from 2012 and did not note any significant change  [facility] reading (PRIMARY) by  Dr. Patsy Lager. CXR: normal aeration, no hyperinflation or infiltrate  EXAM: CHEST 2 VIEW  COMPARISON: None.  FINDINGS: The heart size and mediastinal contours are within normal limits. Both lungs are clear. The visualized skeletal structures are unremarkable.  IMPRESSION: No active cardiopulmonary disease.  Spirometry: FVC and FVC1 at 76 and 78% predicted PVC1% 102% predicted.   Results for orders placed in visit on 06/30/12  POCT GLYCOSYLATED HEMOGLOBIN (HGB A1C)      Result Value Range   Hemoglobin A1C 7.3    POCT CBC      Result Value Range   WBC 8.0  4.6 - 10.2 K/uL   Lymph, poc 2.9  0.6 - 3.4   POC LYMPH PERCENT 36.6  10 - 50 %L   MID (cbc) 0.8  0 - 0.9   POC MID % 9.7  0 - 12 %M   POC Granulocyte 4.3  2 - 6.9   Granulocyte percent 53.7  37 - 80 %G   RBC 4.66  4.04 - 5.48 M/uL   Hemoglobin 13.0  12.2 - 16.2 g/dL   HCT, POC 16.1  09.6 - 47.9 %   MCV 91.1  80 - 97 fL   MCH, POC 27.9  27 - 31.2 pg   MCHC 30.6 (*) 31.8 - 35.4 g/dL   RDW, POC 04.5     Platelet Count, POC 262  142 - 424 K/uL   MPV 10.8  0 - 99.8 fL    Assessment and Plan: Type I (juvenile type) diabetes mellitus with unspecified complication, uncontrolled - Plan: POCT glycosylated hemoglobin (Hb A1C)  SOB (shortness of breath) - Plan: DG Chest 2 View  Chest pain - Plan: DG Chest 2 View, EKG 12-Lead, Ambulatory referral to Cardiology  Morbid obesity  Depression - Plan: desvenlafaxine (PRISTIQ) 50 MG 24 hr tablet  Heartburn - Plan: POCT CBC  Mckinzee is here today with a few concerns;  Refilled her pristiq for one  year BP control is ok, continue meds  DM: A1c is a little higher than in January.  As soon  as her heart is cleared will have her exercise more, for now watch diet  SOB: suspect due to deconditioning and body habitus. Discussed PE- this is unlikely given her VS and duration of sx, but is possible.  Discussed a D-dimer or CT angiogram.  At this time she declines both these tests- will keep a close eye on her sx and seek care for any worsening  CP: may also be deconditioning but she certainly has CAD risk factors.  Will need a cardiac evaluation anyway prior to any weight loss surgery.  She will start a baby aspirin and will refer to cardiology for evaluation/ likely stress test.  If any new/ different/ worse CP she is to seek care right away. If negative cardiac evaluation may need pulmonary evaluation at some point.   Signed Abbe Amsterdam, MD

## 2012-06-30 NOTE — Patient Instructions (Addendum)
Take a baby aspirin each day. If you have not heard regarding your cardiology appt in the next week please give me a call.  Any new, different or worsening shortness of breath or chest pain- seek help right away.    Your A1d is a little bit higher.  Once we are sure your heart is ok you can increase your exercise.  Please watch your diet.

## 2012-07-02 ENCOUNTER — Telehealth: Payer: Self-pay | Admitting: Radiology

## 2012-07-02 NOTE — Telephone Encounter (Signed)
I have form for prior auth of Pristiq will pull paper chart and complete.

## 2012-07-02 NOTE — Telephone Encounter (Signed)
I have prior auth form, was going to pull chart. However patient has no old chart in file. Please advise I must fill out any antidepressants she has tried and failed in order for the pristiq to be covered. Do you know if she has tried any other meds?

## 2012-07-05 NOTE — Telephone Encounter (Signed)
Pt reports she was able to fill her pristiq without a problem.  She will let us know if any issues arise

## 2012-08-05 ENCOUNTER — Encounter: Payer: Self-pay | Admitting: Cardiology

## 2012-08-05 ENCOUNTER — Encounter: Payer: Self-pay | Admitting: *Deleted

## 2012-08-06 ENCOUNTER — Encounter: Payer: 59 | Admitting: Cardiology

## 2012-08-06 NOTE — Progress Notes (Signed)
  HPI: 47 year old female with no prior cardiac history for evaluation of chest pain. Chest x-rayJune 2014 showed no active cardiopulmonary disease. Hemoglobin 13.  Current Outpatient Prescriptions  Medication Sig Dispense Refill  . amLODipine (NORVASC) 10 MG tablet Take 1 tablet (10 mg total) by mouth daily.  30 tablet  11  . desvenlafaxine (PRISTIQ) 50 MG 24 hr tablet Take 1 tablet (50 mg total) by mouth daily.  90 tablet  3  . losartan-hydrochlorothiazide (HYZAAR) 100-25 MG per tablet Take 1 tablet by mouth daily.  30 tablet  11  . metFORMIN (GLUCOPHAGE) 500 MG tablet Take 1 tablet (500 mg total) by mouth 2 (two) times daily with a meal.  60 tablet  11  . simvastatin (ZOCOR) 10 MG tablet Take 1 tablet (10 mg total) by mouth at bedtime.  30 tablet  11   No current facility-administered medications for this visit.    No Known Allergies  Past Medical History  Diagnosis Date  . Depression   . Diabetes mellitus without complication   . Hypertension   . Chest pain     Past Surgical History  Procedure Laterality Date  . Knee surgery      History   Social History  . Marital Status: Married    Spouse Name: N/A    Number of Children: N/A  . Years of Education: N/A   Occupational History  . Not on file.   Social History Main Topics  . Smoking status: Current Every Day Smoker  . Smokeless tobacco: Not on file  . Alcohol Use: Yes  . Drug Use: No  . Sexually Active: Yes    Birth Control/ Protection: None   Other Topics Concern  . Not on file   Social History Narrative  . No narrative on file    Family History  Problem Relation Age of Onset  . Cancer Father     ROS: no fevers or chills, productive cough, hemoptysis, dysphasia, odynophagia, melena, hematochezia, dysuria, hematuria, rash, seizure activity, orthopnea, PND, pedal edema, claudication. Remaining systems are negative.  Physical Exam:   There were no vitals taken for this visit.  General:  Well  developed/well nourished in NAD Skin warm/dry Patient not depressed No peripheral clubbing Back-normal HEENT-normal/normal eyelids Neck supple/normal carotid upstroke bilaterally; no bruits; no JVD; no thyromegaly chest - CTA/ normal expansion CV - RRR/normal S1 and S2; no murmurs, rubs or gallops;  PMI nondisplaced Abdomen -NT/ND, no HSM, no mass, + bowel sounds, no bruit 2+ femoral pulses, no bruits Ext-no edema, chords, 2+ DP Neuro-grossly nonfocal  ECG 06/30/2012-sinus rhythm with no ST changes.   This encounter was created in error - please disregard.

## 2012-08-16 ENCOUNTER — Telehealth: Payer: Self-pay | Admitting: Family Medicine

## 2012-08-16 NOTE — Telephone Encounter (Signed)
Spoke with her and got needed information.  Will write letter of medical necessity and fax.

## 2012-08-16 NOTE — Telephone Encounter (Signed)
Dr. Patsy Lager may have chart When the patient call back please ask the following questions [facility] ATTEMPTS PLEASE INCLUDE DATES,RESULTS,MEDICATIONS,DIET AND EXERCISE its for a form for [place] surgery to have surgery MR# [id number] Please give chart back to Dr. Patsy Lager

## 2012-09-07 ENCOUNTER — Telehealth: Payer: Self-pay | Admitting: *Deleted

## 2012-09-07 NOTE — Telephone Encounter (Addendum)
Called Nicole Hubbard per Dr. Patsy Lager to let her know that her rx for pristique was refill and would like to know if it was accepted of denied by the pharmacy and if she needs more documentation from Dr. Patsy Lager. She did not answer left a message to call back

## 2012-09-14 ENCOUNTER — Ambulatory Visit: Payer: 59 | Admitting: Family Medicine

## 2012-09-14 VITALS — BP 138/92 | HR 90 | Temp 98.3°F | Resp 18 | Wt 331.0 lb

## 2012-09-14 DIAGNOSIS — E785 Hyperlipidemia, unspecified: Secondary | ICD-10-CM

## 2012-09-14 DIAGNOSIS — E7800 Pure hypercholesterolemia, unspecified: Secondary | ICD-10-CM

## 2012-09-14 DIAGNOSIS — E119 Type 2 diabetes mellitus without complications: Secondary | ICD-10-CM

## 2012-09-14 DIAGNOSIS — E1165 Type 2 diabetes mellitus with hyperglycemia: Secondary | ICD-10-CM

## 2012-09-14 DIAGNOSIS — IMO0002 Reserved for concepts with insufficient information to code with codable children: Secondary | ICD-10-CM

## 2012-09-14 DIAGNOSIS — I10 Essential (primary) hypertension: Secondary | ICD-10-CM

## 2012-09-14 LAB — GLUCOSE, POCT (MANUAL RESULT ENTRY): POC Glucose: 119 mg/dl — AB (ref 70–99)

## 2012-09-14 MED ORDER — FREESTYLE SYSTEM KIT: 1.0000 | PACK | Status: DC | PRN

## 2012-09-14 NOTE — Progress Notes (Signed)
Subjective:    Patient ID: Nicole Hubbard, female    DOB: 1965-04-27, 47 y.o.   MRN: [id number]  HPI Nicole Hubbard is a 47 y.o. female See prior ov's with Dr. Patsy Lager.   DM2 - Aic in January 6.8.  Increased at 06/30/12 visit to 7.3.  Other labs below. Weight 331 last ov.  Same today. Not checking home blood sugars. No machine to check levels at home. Metformin 500mg  BID, but "when runs out - takes a while to get to the pharmacy".   Misses about 6 days per month of meds - forgets at times, uses pill box. No new side effects of meds. No new diet changes, not exercising. Will be meeting with bariatric surgeon tomorrow.  Dentist: last appt 1 year ago - will be setting up new dentist.   Optho: last year. Plans on repeat ov this year. Has blurry vision at times.   HTN - on hyzaar 100/25 and norvasc 10mg  QD - no missed doses of these meds.  Having some headaches recently - past few days. No focal weakness, slurred speech, or new neurological sx's.   Hyperlipidemia - no new myalgias or side effects. Takes simvastatin 10mg  qhs.    Results for orders placed in visit on 06/30/12  POCT GLYCOSYLATED HEMOGLOBIN (HGB A1C)      Result Value Range   Hemoglobin A1C 7.3    POCT CBC      Result Value Range   WBC 8.0  4.6 - 10.2 K/uL   Lymph, poc 2.9  0.6 - 3.4   POC LYMPH PERCENT 36.6  10 - 50 %L   MID (cbc) 0.8  0 - 0.9   POC MID % 9.7  0 - 12 %M   POC Granulocyte 4.3  2 - 6.9   Granulocyte percent 53.7  37 - 80 %G   RBC 4.66  4.04 - 5.48 M/uL   Hemoglobin 13.0  12.2 - 16.2 g/dL   HCT, POC 65.7  84.6 - 47.9 %   MCV 91.1  80 - 97 fL   MCH, POC 27.9  27 - 31.2 pg   MCHC 30.6 (*) 31.8 - 35.4 g/dL   RDW, POC 96.2     Platelet Count, POC 262  142 - 424 K/uL   MPV 10.8  0 - 99.8 fL    Review of Systems  Constitutional: Negative for fatigue and unexpected weight change.  Respiratory: Negative for chest tightness and shortness of breath.   Cardiovascular: Negative for chest pain, palpitations and leg  swelling.  Gastrointestinal: Negative for abdominal pain and blood in stool.  Neurological: Negative for dizziness, light-headedness and headaches.       Objective:   Physical Exam  Constitutional: She is oriented to person, place, and time. She appears well-developed and well-nourished. No distress.  Overweight/obese.   HENT:  Head: Normocephalic and atraumatic.  Mouth/Throat: Oropharynx is clear and moist.  Eyes: Conjunctivae and EOM are normal. Pupils are equal, round, and reactive to light.  Neck: Normal range of motion. No thyromegaly present.  Cardiovascular: Normal rate, regular rhythm, normal heart sounds and intact distal pulses.   No murmur heard. Pulmonary/Chest: Effort normal. She has no wheezes. She has no rales.  Abdominal: Soft. Normal appearance. She exhibits no abdominal bruit and no pulsatile midline mass. There is no tenderness.  Neurological: She is alert and oriented to person, place, and time. She has normal strength. No cranial nerve deficit.  Microfilament testing WNL bilat feet. nonfocal  exam.  No pronator drift.    Skin: Skin is warm and dry.  Psychiatric: She has a normal mood and affect. Her behavior is normal.    Results for orders placed in visit on 09/14/12  GLUCOSE, POCT (MANUAL RESULT ENTRY)      Result Value Range   POC Glucose 119 (*) 70 - 99 mg/dl  POCT GLYCOSYLATED HEMOGLOBIN (HGB A1C)      Result Value Range   Hemoglobin A1C 7.3         Assessment & Plan:  Nicole Hubbard is a 47 y.o. female Hypertension - Plan: Comprehensive metabolic panel  Hyperlipidemia - Plan: Comprehensive metabolic panel  DM (diabetes mellitus), type 2, uncontrolled - Plan: POCT glucose (manual entry), POCT glycosylated hemoglobin (Hb A1C), glucose monitoring kit (FREESTYLE) monitoring kit  Diabetes mellitus, type 2  High cholesterol   DM2 - plans on scheduling optho eval for retina check and discuss vision. Will be establishing new dentist. Compliance  discussed and risks of med nonadherence discussed. Discussed options with current A1C including med increases, but agreed to work on diet and weight loss with activity/exercise, will also work on compliance. RTC 3 months for repeat A1c. Will check home CBG's (meter Rx).   Obesity - stressed diet and activity/exercise. No weight change since last ov.  Will be meeting with bariatric surgeon tomorrow.  Can also rtc to discuss further with primary provider.   HTN - uncontrolled, but adherent to htn meds. likely cause of HA, but nonfocal exam. Recheck BP better.  No changes at present as plans to monitor diet, avoid high sodium foods and work on weight loss. If remains elevated OOW, then consider change in regimen.   Hyperlipidemia-  Labs pending.   Meds ordered this encounter  Medications  . glucose monitoring kit (FREESTYLE) monitoring kit    Sig: 1 each by Does not apply route as needed for other. Check blood sugars once per day.    Dispense:  1 each    Refill:  0    Dispense with testing strips - check blood sugar once per day, disp #100, 3 refills, and lancets, disp #100, 3 refills.   Patient Instructions  Cut back on sodium (fast food, restaurant food at times). Work on diet and activity as discussed. Take medicines as discussed for diabetes and your numbers should improve.  Keep a record of your blood pressures outside of the office and bring them to the next office visit. If running over 140/90 consistently, call us as may need to adjust your medicines.  You should receive a call or letter about your lab results within the next week to 10 days.  Follow up with Dr. Patsy Lager in next 3 months.

## 2012-09-14 NOTE — Patient Instructions (Addendum)
Cut back on sodium (fast food, restaurant food at times). Work on diet and activity as discussed. Take medicines as discussed for diabetes and your numbers should improve.  Keep a record of your blood pressures outside of the office and bring them to the next office visit. If running over 140/90 consistently, call us as may need to adjust your medicines.  You should receive a call or letter about your lab results within the next week to 10 days.  Follow up with Nicole Hubbard in next 3 months.

## 2012-09-15 ENCOUNTER — Ambulatory Visit (INDEPENDENT_AMBULATORY_CARE_PROVIDER_SITE_OTHER): Payer: 59 | Admitting: General Surgery

## 2012-09-15 ENCOUNTER — Encounter (INDEPENDENT_AMBULATORY_CARE_PROVIDER_SITE_OTHER): Payer: Self-pay | Admitting: General Surgery

## 2012-09-15 DIAGNOSIS — E785 Hyperlipidemia, unspecified: Secondary | ICD-10-CM

## 2012-09-15 DIAGNOSIS — G4733 Obstructive sleep apnea (adult) (pediatric): Secondary | ICD-10-CM

## 2012-09-15 DIAGNOSIS — Z6841 Body Mass Index (BMI) 40.0 and over, adult: Secondary | ICD-10-CM

## 2012-09-15 LAB — COMPREHENSIVE METABOLIC PANEL
Albumin: 4.2 g/dL (ref 3.5–5.2)
Alkaline Phosphatase: 52 U/L (ref 39–117)
BUN: 11 mg/dL (ref 6–23)
CO2: 27 mEq/L (ref 19–32)
Glucose, Bld: 121 mg/dL — ABNORMAL HIGH (ref 70–99)
Sodium: 139 mEq/L (ref 135–145)
Total Bilirubin: 0.3 mg/dL (ref 0.3–1.2)
Total Protein: 8.1 g/dL (ref 6.0–8.3)

## 2012-09-15 NOTE — Progress Notes (Signed)
Hubbard ID: Nicole Hubbard, female   DOB: 12/16/1965, 47 y.o.   MRN: [id number]  Chief Complaint  Hubbard presents with  . Bariatric Pre-op    HPI Nicole Hubbard is a 47 y.o. female.   HPI 47 year old morbidly obese African American female referred by Dr. Patsy Lager for evaluation for weight loss surgery. The Hubbard states that she is specifically interested in gastric bypass surgery because she doesn't want a foreign device in her body. She has also had some acquaintances that have had gastric bypass surgery as well. She states that she has struggled with her weight since her adulthood. Despite numerous attempts for sustained weight loss she has been unsuccessful. She has tried liquid diets, Slim fast, Weight Watchers, and phentermine. She was on phentermine for about 2 years and lost about 80 pounds but slowly regained.  Past Medical History  Diagnosis Date  . Depression   . Diabetes mellitus without complication   . Hypertension   . Chest pain     Past Surgical History  Procedure Laterality Date  . Knee surgery    . Tonsillectomy      Family History  Problem Relation Age of Onset  . Cancer Father     urithrial    Social History History  Substance Use Topics  . Smoking status: Former Smoker    Types: Cigarettes    Quit date: 09/15/2008  . Smokeless tobacco: Never Used  . Alcohol Use: Yes    No Known Allergies  Current Outpatient Prescriptions  Medication Sig Dispense Refill  . amLODipine (NORVASC) 10 MG tablet Take 1 tablet (10 mg total) by mouth daily.  30 tablet  11  . desvenlafaxine (PRISTIQ) 50 MG 24 hr tablet Take 1 tablet (50 mg total) by mouth daily.  90 tablet  3  . glucose monitoring kit (FREESTYLE) monitoring kit 1 each by Does not apply route as needed for other. Check blood sugars once per day.  1 each  0  . losartan-hydrochlorothiazide (HYZAAR) 100-25 MG per tablet Take 1 tablet by mouth daily.  30 tablet  11  . metFORMIN (GLUCOPHAGE) 500 MG tablet Take 1 tablet  (500 mg total) by mouth 2 (two) times daily with a meal.  60 tablet  11  . simvastatin (ZOCOR) 10 MG tablet Take 1 tablet (10 mg total) by mouth at bedtime.  30 tablet  11   No current facility-administered medications for this visit.    Review of Systems Review of Systems  Constitutional: Positive for fatigue. Negative for fever, chills and unexpected weight change.  HENT: Negative for hearing loss, nosebleeds, congestion, sore throat, trouble swallowing, neck pain and voice change.   Eyes: Negative for visual disturbance.  Respiratory: Negative for cough, shortness of breath and wheezing.        Had sleep study about 61yrs ago, ordered by former PCP who works at [employer] on [facility], was positive per pt but didn't complete workup  Cardiovascular: Negative for chest pain, palpitations and leg swelling.       Denies CP, some DOE, denies orthopnea, PND  Gastrointestinal: Negative for nausea, vomiting, abdominal pain, diarrhea, constipation, blood in stool, abdominal distention and anal bleeding.  Endocrine:       Hemoglobin A1c went from 6.8 to 7.3  Genitourinary: Negative for dysuria, hematuria, vaginal bleeding, difficulty urinating and dyspareunia.       G2P1; +menopause; due for mammogram; some leakage with strong cough  Musculoskeletal: Negative for arthralgias.  B/l knee pain with OA; rt knee surgery  Skin: Negative for rash and wound.  Neurological: Negative for seizures, syncope and headaches.       Denies TIAs and amaurosis fugax  Hematological: Negative for adenopathy. Does not bruise/bleed easily.  Psychiatric/Behavioral: Negative for confusion.    Blood pressure 132/98, pulse 100, resp. rate 16, height 5\' 8"  (1.727 m), weight 332 lb 12.8 oz (150.957 kg).  Physical Exam Physical Exam  Vitals reviewed. Constitutional: She is oriented to person, place, and time. She appears well-developed and well-nourished. No distress.  Morbidly obese  HENT:  Head: Normocephalic  and atraumatic.  Right Ear: External ear normal.  Left Ear: External ear normal.  Eyes: Conjunctivae are normal. No scleral icterus.  Neck: Normal range of motion. Neck supple. No tracheal deviation present. No thyromegaly present.  Cardiovascular: Normal rate and normal heart sounds.   Pulmonary/Chest: Effort normal and breath sounds normal. No stridor. No respiratory distress. She has no wheezes.  Abdominal: Soft. She exhibits no distension. There is no tenderness. There is no rebound and no guarding.  obese  Musculoskeletal: She exhibits no edema and no tenderness.  Lymphadenopathy:    She has no cervical adenopathy.  Neurological: She is alert and oriented to person, place, and time. She exhibits normal muscle tone.  Skin: Skin is warm and dry. No rash noted. She is not diaphoretic. No erythema.  Psychiatric: She has a normal mood and affect. Her behavior is normal. Judgment and thought content normal.    Data Reviewed Dr Paralee Cancel note 09/14/12 1/14- lipids TC 219, LDL 151 Epworth sleepiness scale score 14  Assessment    Morbid obesity BMI  50.6 Diabetes Mellitus type 2 OSA Dislipidemia HTN    Plan    The Hubbard meets weight loss surgery criteria. I think the Hubbard would be an acceptable candidate for Laparoscopic Roux-en-Y Gastric bypass.   We discussed laparoscopic Roux-en-Y gastric bypass. We discussed the preoperative, operative and postoperative process. Using diagrams, I explained the surgery in detail including the performance of an EGD near the end of the surgery and an Upper GI swallow study on POD 1. We discussed the typical hospital course including a 2-3 day stay baring any complications.   The Hubbard was given educational material. I quoted the Hubbard that they can expect to lose 50-70% of their excess weight with the gastric bypass. We did discuss the possibility of weight regain several years after the procedure.  We discussed the risk and benefits of  surgery including but not limited to anesthesia risk, bleeding, infection, anastomotic edema requiring a few additional days in the hospital, postop nausea, possible conversion to open procedure, blood clot formation, anastomotic leak, anastomotic stricture, ulcer formation, death, respiratory complications, intestinal blockage, internal hernia, gallstone formation, vitamin and nutritional deficiencies, hair loss, weight regain injury to surrounding structures, failure to lose weight and mood changes.  We discussed that before and after surgery that there would be an alteration in their diet. I explained that we have put them on a diet 2 weeks before surgery. I also explained that they would be on a liquid diet for 2 weeks after surgery. We discussed that they would have to avoid certain foods such as sugar after surgery. We discussed the importance of physical activity as well as compliance with our dietary and supplement recommendations and routine follow-up in order to maximize success.  I explained to the Hubbard that we will start our evaluation process which includes labs, Upper GI to  evaluate stomach and swallowing anatomy, nutritionist consultation, psychiatrist consultation, EKG, CXR, abdominal ultrasound, completing her sleep apnea evaluation.  Mary Sella. Andrey Campanile, MD, FACS General, Bariatric, & Minimally Invasive Surgery [facility], [place]         [facility] M 09/15/2012, 1:04 PM

## 2012-09-15 NOTE — Patient Instructions (Signed)
We will start our evaluation process. Please call with questions

## 2012-09-16 ENCOUNTER — Telehealth: Payer: Self-pay

## 2012-09-16 NOTE — Telephone Encounter (Signed)
PUT DISABILITY FORM IN DR COPLAND'S BOX, PT HAVEN'T PAID

## 2012-09-21 ENCOUNTER — Telehealth: Payer: Self-pay

## 2012-09-21 NOTE — Telephone Encounter (Signed)
Patient returning call about labs. [phone]

## 2012-09-21 NOTE — Telephone Encounter (Signed)
See labs 

## 2012-09-22 LAB — CBC WITH DIFFERENTIAL/PLATELET
Hemoglobin: 13 g/dL (ref 12.0–15.0)
Lymphocytes Relative: 46 % (ref 12–46)
Lymphs Abs: 2.6 10*3/uL (ref 0.7–4.0)
MCV: 85.4 fL (ref 78.0–100.0)
Monocytes Relative: 9 % (ref 3–12)
Neutrophils Relative %: 44 % (ref 43–77)
Platelets: 279 10*3/uL (ref 150–400)
RBC: 4.58 MIL/uL (ref 3.87–5.11)
WBC: 5.7 10*3/uL (ref 4.0–10.5)

## 2012-09-22 LAB — COMPREHENSIVE METABOLIC PANEL
ALT: 12 U/L (ref 0–35)
Albumin: 4.2 g/dL (ref 3.5–5.2)
CO2: 30 mEq/L (ref 19–32)
Calcium: 10 mg/dL (ref 8.4–10.5)
Chloride: 101 mEq/L (ref 96–112)
Glucose, Bld: 148 mg/dL — ABNORMAL HIGH (ref 70–99)
Sodium: 139 mEq/L (ref 135–145)
Total Protein: 7.9 g/dL (ref 6.0–8.3)

## 2012-09-22 LAB — T4: T4, Total: 7.8 ug/dL (ref 5.0–12.5)

## 2012-09-22 LAB — LIPID PANEL
HDL: 42 mg/dL (ref >39)
LDL Cholesterol: 107 mg/dL — ABNORMAL HIGH (ref 0–99)
Total CHOL/HDL Ratio: 3.9 Ratio
VLDL: 14 mg/dL (ref 0–40)

## 2012-10-04 ENCOUNTER — Encounter (INDEPENDENT_AMBULATORY_CARE_PROVIDER_SITE_OTHER): Payer: Self-pay

## 2012-10-11 ENCOUNTER — Ambulatory Visit: Payer: 59 | Admitting: Dietician

## 2012-10-15 ENCOUNTER — Ambulatory Visit (HOSPITAL_COMMUNITY)
Admission: RE | Admit: 2012-10-15 | Discharge: 2012-10-15 | Disposition: A | Discharge status: Home / Self Care | Payer: 59 | Source: Ambulatory Visit | Attending: General Surgery | Admitting: General Surgery

## 2012-10-15 DIAGNOSIS — R1310 Dysphagia, unspecified: Secondary | ICD-10-CM | POA: Insufficient documentation

## 2012-10-15 DIAGNOSIS — G4733 Obstructive sleep apnea (adult) (pediatric): Secondary | ICD-10-CM | POA: Insufficient documentation

## 2012-10-15 DIAGNOSIS — K449 Diaphragmatic hernia without obstruction or gangrene: Secondary | ICD-10-CM | POA: Insufficient documentation

## 2012-10-15 DIAGNOSIS — R12 Heartburn: Secondary | ICD-10-CM | POA: Insufficient documentation

## 2012-10-15 DIAGNOSIS — I10 Essential (primary) hypertension: Secondary | ICD-10-CM | POA: Insufficient documentation

## 2012-10-15 DIAGNOSIS — E119 Type 2 diabetes mellitus without complications: Secondary | ICD-10-CM | POA: Insufficient documentation

## 2012-10-15 DIAGNOSIS — Z6841 Body Mass Index (BMI) 40.0 and over, adult: Secondary | ICD-10-CM | POA: Insufficient documentation

## 2012-10-15 DIAGNOSIS — R160 Hepatomegaly, not elsewhere classified: Secondary | ICD-10-CM | POA: Insufficient documentation

## 2012-12-02 NOTE — Telephone Encounter (Signed)
error 

## 2013-05-14 ENCOUNTER — Other Ambulatory Visit: Payer: Self-pay | Admitting: Family Medicine

## 2013-07-27 ENCOUNTER — Telehealth: Payer: Self-pay | Admitting: Family Medicine

## 2013-07-27 NOTE — Telephone Encounter (Signed)
Pt has appt with Dr. Lorelei Pont for CPE

## 2013-08-10 ENCOUNTER — Emergency Department (HOSPITAL_COMMUNITY)
Admission: EM | Admit: 2013-08-10 | Discharge: 2013-08-10 | Disposition: A | Discharge status: Home / Self Care | Payer: 59 | Attending: Emergency Medicine | Admitting: Emergency Medicine

## 2013-08-10 ENCOUNTER — Encounter (HOSPITAL_COMMUNITY): Payer: Self-pay | Admitting: Emergency Medicine

## 2013-08-10 DIAGNOSIS — I10 Essential (primary) hypertension: Secondary | ICD-10-CM | POA: Diagnosis not present

## 2013-08-10 DIAGNOSIS — F329 Major depressive disorder, single episode, unspecified: Secondary | ICD-10-CM | POA: Diagnosis not present

## 2013-08-10 DIAGNOSIS — Z87891 Personal history of nicotine dependence: Secondary | ICD-10-CM | POA: Diagnosis not present

## 2013-08-10 DIAGNOSIS — Z79899 Other long term (current) drug therapy: Secondary | ICD-10-CM | POA: Diagnosis not present

## 2013-08-10 DIAGNOSIS — E119 Type 2 diabetes mellitus without complications: Secondary | ICD-10-CM | POA: Diagnosis not present

## 2013-08-10 DIAGNOSIS — F3289 Other specified depressive episodes: Secondary | ICD-10-CM | POA: Diagnosis not present

## 2013-08-10 DIAGNOSIS — R42 Dizziness and giddiness: Secondary | ICD-10-CM | POA: Diagnosis not present

## 2013-08-10 HISTORY — DX: Migraine, unspecified, not intractable, without status migrainosus: G43.909

## 2013-08-10 LAB — CBG MONITORING, ED: Glucose-Capillary: 194 mg/dL — ABNORMAL HIGH (ref 70–99)

## 2013-08-10 LAB — BASIC METABOLIC PANEL
Anion gap: 14 (ref 5–15)
BUN: 10 mg/dL (ref 6–23)
CHLORIDE: 99 meq/L (ref 96–112)
CO2: 26 meq/L (ref 19–32)
CREATININE: 0.74 mg/dL (ref 0.50–1.10)
Calcium: 9.6 mg/dL (ref 8.4–10.5)
GFR calc Af Amer: >90 mL/min (ref >90)
GFR calc non Af Amer: >90 mL/min (ref >90)
Glucose, Bld: 187 mg/dL — ABNORMAL HIGH (ref 70–99)
Potassium: 4 mEq/L (ref 3.7–5.3)
Sodium: 139 mEq/L (ref 137–147)

## 2013-08-10 LAB — CBC
HCT: 42.7 % (ref 36.0–46.0)
Hemoglobin: 14 g/dL (ref 12.0–15.0)
MCH: 28.7 pg (ref 26.0–34.0)
MCHC: 32.8 g/dL (ref 30.0–36.0)
MCV: 87.5 fL (ref 78.0–100.0)
Platelets: 282 10*3/uL (ref 150–400)
RBC: 4.88 MIL/uL (ref 3.87–5.11)
RDW: 14 % (ref 11.5–15.5)
WBC: 6.9 10*3/uL (ref 4.0–10.5)

## 2013-08-10 MED ORDER — MECLIZINE HCL 25 MG PO TABS
25.0000 mg | ORAL_TABLET | Freq: Once | ORAL | Status: AC
  Administered 2013-08-10: 25 mg via ORAL
  Filled 2013-08-10: qty 1

## 2013-08-10 MED ORDER — DIAZEPAM 5 MG PO TABS
5.0000 mg | ORAL_TABLET | Freq: Once | ORAL | Status: AC
  Administered 2013-08-10: 5 mg via ORAL
  Filled 2013-08-10: qty 1

## 2013-08-10 MED ORDER — DIAZEPAM 5 MG PO TABS: 5.0000 mg | ORAL_TABLET | Freq: Three times a day (TID) | ORAL | Status: DC | PRN

## 2013-08-10 MED ORDER — MECLIZINE HCL 25 MG PO TABS: 25.0000 mg | ORAL_TABLET | Freq: Three times a day (TID) | ORAL | Status: DC | PRN

## 2013-08-10 MED ORDER — SODIUM CHLORIDE 0.9 % IV BOLUS (SEPSIS)
1000.0000 mL | Freq: Once | INTRAVENOUS | Status: AC
Start: 2013-08-10 — End: 2013-08-10
  Administered 2013-08-10: 1000 mL via INTRAVENOUS

## 2013-08-10 NOTE — ED Provider Notes (Signed)
CSN: [id number]     Arrival date & time 08/10/13  1031 History   First MD Initiated Contact with Patient 08/10/13 1035     Chief Complaint  Patient presents with  . Dizziness  . Nausea  . Blurred Vision     (Consider location/radiation/quality/duration/timing/severity/associated sxs/prior Treatment) Patient is a 48 y.o. female presenting with dizziness. The history is provided by the patient.  Dizziness Quality:  Head spinning and lightheadedness Severity:  Moderate Onset quality:  Sudden Duration: 20 minutes. Timing:  Constant Progression:  Improving Chronicity:  New Context: standing up   Relieved by:  Being still Ineffective treatments:  None tried Associated symptoms: nausea   Associated symptoms: no blood in stool, no chest pain, no headaches, no syncope, no tinnitus, no vomiting and no weakness     Past Medical History  Diagnosis Date  . Depression   . Diabetes mellitus without complication   . Hypertension   . Chest pain   . Migraine    Past Surgical History  Procedure Laterality Date  . Knee surgery    . Tonsillectomy     Family History  Problem Relation Age of Onset  . Cancer Father     urithrial   History  Substance Use Topics  . Smoking status: Former Smoker    Types: Cigarettes    Quit date: 09/15/2008  . Smokeless tobacco: Never Used  . Alcohol Use: Yes   OB History   Grav Para Term Preterm Abortions TAB SAB Ect Mult Living                 Review of Systems  Constitutional: Negative for fever and chills.  HENT: Negative for tinnitus.   Cardiovascular: Negative for chest pain and syncope.  Gastrointestinal: Positive for nausea. Negative for vomiting and blood in stool.  Neurological: Positive for dizziness. Negative for headaches.  All other systems reviewed and are negative.     Allergies  Review of patient's allergies indicates no known allergies.  Home Medications   Prior to Admission medications   Medication Sig Start Date  End Date Taking? Authorizing Provider  amLODipine (NORVASC) 10 MG tablet Take 1 tablet (10 mg total) by mouth daily. 01/26/12  Yes Gay Filler Copland, MD  desvenlafaxine (PRISTIQ) 50 MG 24 hr tablet Take 1 tablet (50 mg total) by mouth daily. 06/30/12  Yes Gay Filler Copland, MD  losartan-hydrochlorothiazide (HYZAAR) 100-25 MG per tablet Take 1 tablet by mouth daily.   Yes Historical Provider, MD   BP 144/88  Pulse 108  Temp(Src) 97.1 F (36.2 C) (Oral)  Resp 15  SpO2 97% Physical Exam  Nursing note and vitals reviewed. Constitutional: She is oriented to person, place, and time. She appears well-developed and well-nourished. No distress.  HENT:  Head: Normocephalic and atraumatic.  Mouth/Throat: Oropharynx is clear and moist.  Eyes: EOM are normal. Pupils are equal, round, and reactive to light.  Neck: Normal range of motion. Neck supple.  Cardiovascular: Normal rate and regular rhythm.  Exam reveals no friction rub.   No murmur heard. Pulmonary/Chest: Effort normal and breath sounds normal. No respiratory distress. She has no wheezes. She has no rales.  Abdominal: Soft. She exhibits no distension. There is no tenderness. There is no rebound.  Musculoskeletal: Normal range of motion. She exhibits no edema.  Neurological: She is alert and oriented to person, place, and time.  Skin: No rash noted. She is not diaphoretic.    ED Course  Procedures (including critical care time) Labs  Review Labs Reviewed  BASIC METABOLIC PANEL - Abnormal; Notable for the following:    Glucose, Bld 187 (*)    All other components within normal limits  CBG MONITORING, ED - Abnormal; Notable for the following:    Glucose-Capillary 194 (*)    All other components within normal limits  CBC    Imaging Review No results found.   EKG Interpretation None      Date: 08/10/2013  Rate: 109  Rhythm: sinus tachycardia  QRS Axis: normal  Intervals: normal  ST/T Wave abnormalities: normal  Conduction  Disutrbances:none  Narrative Interpretation:   Old EKG Reviewed: unchanged   MDM   Final diagnoses:  Vertigo    35F here from work with dizziness, nausea. Stood up and began lightheaded with mild nausea. Able to walk to the bathroom. When she got back to her desk, she felt the room was spinning. No CP, but mild SOB with this. Stated some blurry vision, but has chronic blurry vision and it did not change today.  After sitting back down, she had some room-spinning dizziness. She does have a high stress job and denies headache but feels similar to how she feels before she gets a migraine. Feeling better after about 20 minutes, given Zofran by EMS with some relief. Here normal cranial nerves except for some R beating nystagmus on EOMs. Normal coordination.  No signs of stroke, story and exam seem more consistent with peripheral vertigo rather and central nervous system problem. Will check labs, get EKG. Orthostatics positive HR went from 110 to 130s. After fluids, meds, feeling better. Ambulating well without assistance. Stable for discharge.   Osvaldo Shipper, MD 08/10/13 [phone]

## 2013-08-10 NOTE — Discharge Instructions (Signed)
Benign Positional Vertigo Vertigo means you feel like you or your surroundings are moving when they are not. Benign positional vertigo is the most common form of vertigo. Benign means that the cause of your condition is not serious. Benign positional vertigo is more common in older adults. CAUSES  Benign positional vertigo is the result of an upset in the labyrinth system. This is an area in the middle ear that helps control your balance. This may be caused by a viral infection, head injury, or repetitive motion. However, often no specific cause is found. SYMPTOMS  Symptoms of benign positional vertigo occur when you move your head or eyes in different directions. Some of the symptoms may include:  Loss of balance and falls.  Vomiting.  Blurred vision.  Dizziness.  Nausea.  Involuntary eye movements (nystagmus). DIAGNOSIS  Benign positional vertigo is usually diagnosed by physical exam. If the specific cause of your benign positional vertigo is unknown, your caregiver may perform imaging tests, such as magnetic resonance imaging (MRI) or computed tomography (CT). TREATMENT  Your caregiver may recommend movements or procedures to correct the benign positional vertigo. Medicines such as meclizine, benzodiazepines, and medicines for nausea may be used to treat your symptoms. In rare cases, if your symptoms are caused by certain conditions that affect the inner ear, you may need surgery. HOME CARE INSTRUCTIONS   Follow your caregiver's instructions.  Move slowly. Do not make sudden body or head movements.  Avoid driving.  Avoid operating heavy machinery.  Avoid performing any tasks that would be dangerous to you or others during a vertigo episode.  Drink enough fluids to keep your urine clear or pale yellow. SEEK IMMEDIATE MEDICAL CARE IF:   You develop problems with walking, weakness, numbness, or using your arms, hands, or legs.  You have difficulty speaking.  You develop  severe headaches.  Your nausea or vomiting continues or gets worse.  You develop visual changes.  Your family or friends notice any behavioral changes.  Your condition gets worse.  You have a fever.  You develop a stiff neck or sensitivity to light. MAKE SURE YOU:   Understand these instructions.  Will watch your condition.  Will get help right away if you are not doing well or get worse. Document Released: 10/07/2005 Document Revised: 03/24/2011 Document Reviewed: 09/19/2010 ExitCare Patient Information 2015 [employer]. This information is not intended to replace advice given to you by your health care provider. Make sure you discuss any questions you have with your health care provider.    

## 2013-08-10 NOTE — ED Notes (Signed)
Pt arrived from work by [employer] with c/o lightheaded/dizziness that started today around 0830 while sitting along with nausea and blurred vision. Pt has hx of migraines. Denies any pain. Pt is diabetic and has not had her Metformin in 1 month because she ran out. Initial BP 186/110 HR-100 Last BP-146/95. EMS administered Zofran 4mg  IV which has helped with nausea.

## 2013-08-11 ENCOUNTER — Telehealth: Payer: Self-pay

## 2013-08-11 NOTE — Telephone Encounter (Signed)
Pt dropped off FMLA ppw , placed in Dr. Lillie Fragmin box for completion within 5-7 business days

## 2013-08-11 NOTE — Telephone Encounter (Signed)
Awaiting forms

## 2013-08-12 NOTE — Telephone Encounter (Signed)
Called and spoke with her- I have not seen her in over a year.  Please come in for a visit for me to do FMLA.  She agreed

## 2013-08-15 ENCOUNTER — Encounter: Payer: 59 | Admitting: Family Medicine

## 2013-08-17 ENCOUNTER — Encounter (HOSPITAL_COMMUNITY): Payer: Self-pay | Admitting: General Practice

## 2013-08-17 ENCOUNTER — Inpatient Hospital Stay (HOSPITAL_COMMUNITY)
Admission: AD | Admit: 2013-08-17 | Discharge: 2013-08-22 | DRG: 885 | Disposition: A | Discharge status: Home / Self Care | Payer: PRIVATE HEALTH INSURANCE | Attending: Psychiatry | Admitting: Psychiatry

## 2013-08-17 ENCOUNTER — Telehealth: Payer: Self-pay

## 2013-08-17 ENCOUNTER — Ambulatory Visit (INDEPENDENT_AMBULATORY_CARE_PROVIDER_SITE_OTHER): Payer: 59 | Admitting: Family Medicine

## 2013-08-17 VITALS — BP 134/80 | HR 91 | Temp 98.2°F | Resp 16 | Ht 69.5 in | Wt 324.0 lb

## 2013-08-17 DIAGNOSIS — F332 Major depressive disorder, recurrent severe without psychotic features: Secondary | ICD-10-CM | POA: Diagnosis present

## 2013-08-17 DIAGNOSIS — R45851 Suicidal ideations: Secondary | ICD-10-CM | POA: Diagnosis not present

## 2013-08-17 DIAGNOSIS — E119 Type 2 diabetes mellitus without complications: Secondary | ICD-10-CM | POA: Diagnosis present

## 2013-08-17 DIAGNOSIS — Z888 Allergy status to other drugs, medicaments and biological substances status: Secondary | ICD-10-CM | POA: Diagnosis not present

## 2013-08-17 DIAGNOSIS — F3289 Other specified depressive episodes: Secondary | ICD-10-CM

## 2013-08-17 DIAGNOSIS — G4700 Insomnia, unspecified: Secondary | ICD-10-CM | POA: Diagnosis present

## 2013-08-17 DIAGNOSIS — Z87891 Personal history of nicotine dependence: Secondary | ICD-10-CM

## 2013-08-17 DIAGNOSIS — F329 Major depressive disorder, single episode, unspecified: Secondary | ICD-10-CM

## 2013-08-17 DIAGNOSIS — F32A Depression, unspecified: Secondary | ICD-10-CM

## 2013-08-17 DIAGNOSIS — I10 Essential (primary) hypertension: Secondary | ICD-10-CM | POA: Diagnosis present

## 2013-08-17 DIAGNOSIS — R51 Headache: Secondary | ICD-10-CM | POA: Diagnosis present

## 2013-08-17 DIAGNOSIS — E669 Obesity, unspecified: Secondary | ICD-10-CM | POA: Diagnosis present

## 2013-08-17 DIAGNOSIS — Z6841 Body Mass Index (BMI) 40.0 and over, adult: Secondary | ICD-10-CM

## 2013-08-17 LAB — URINALYSIS, ROUTINE W REFLEX MICROSCOPIC
BILIRUBIN URINE: NEGATIVE
Glucose, UA: NEGATIVE mg/dL
Ketones, ur: NEGATIVE mg/dL
Leukocytes, UA: NEGATIVE
NITRITE: NEGATIVE
Protein, ur: NEGATIVE mg/dL
SPECIFIC GRAVITY, URINE: 1.011 (ref 1.005–1.030)
UROBILINOGEN UA: 0.2 mg/dL (ref 0.0–1.0)
pH: 6 (ref 5.0–8.0)

## 2013-08-17 LAB — COMPREHENSIVE METABOLIC PANEL
ALBUMIN: 3.8 g/dL (ref 3.5–5.2)
ALT: 12 U/L (ref 0–35)
AST: 15 U/L (ref 0–37)
Alkaline Phosphatase: 72 U/L (ref 39–117)
Anion gap: 13 (ref 5–15)
BUN: 9 mg/dL (ref 6–23)
CO2: 28 meq/L (ref 19–32)
Calcium: 9.9 mg/dL (ref 8.4–10.5)
Chloride: 97 mEq/L (ref 96–112)
Creatinine, Ser: 0.87 mg/dL (ref 0.50–1.10)
GFR calc Af Amer: 89 mL/min — ABNORMAL LOW (ref >90)
GFR, EST NON AFRICAN AMERICAN: 77 mL/min — AB (ref >90)
Glucose, Bld: 208 mg/dL — ABNORMAL HIGH (ref 70–99)
POTASSIUM: 3.4 meq/L — AB (ref 3.7–5.3)
Sodium: 138 mEq/L (ref 137–147)
Total Bilirubin: 0.5 mg/dL (ref 0.3–1.2)
Total Protein: 8.6 g/dL — ABNORMAL HIGH (ref 6.0–8.3)

## 2013-08-17 LAB — CBC
HCT: 40.6 % (ref 36.0–46.0)
HEMOGLOBIN: 13.4 g/dL (ref 12.0–15.0)
MCH: 28.3 pg (ref 26.0–34.0)
MCHC: 33 g/dL (ref 30.0–36.0)
MCV: 85.8 fL (ref 78.0–100.0)
Platelets: 321 10*3/uL (ref 150–400)
RBC: 4.73 MIL/uL (ref 3.87–5.11)
RDW: 13.8 % (ref 11.5–15.5)
WBC: 7.3 10*3/uL (ref 4.0–10.5)

## 2013-08-17 LAB — RAPID URINE DRUG SCREEN, HOSP PERFORMED
Amphetamines: NOT DETECTED
BARBITURATES: NOT DETECTED
Benzodiazepines: NOT DETECTED
COCAINE: NOT DETECTED
OPIATES: NOT DETECTED
TETRAHYDROCANNABINOL: NOT DETECTED

## 2013-08-17 LAB — HEPATIC FUNCTION PANEL
ALT: 12 U/L (ref 0–35)
AST: 14 U/L (ref 0–37)
Albumin: 3.8 g/dL (ref 3.5–5.2)
Alkaline Phosphatase: 73 U/L (ref 39–117)
TOTAL PROTEIN: 8.6 g/dL — AB (ref 6.0–8.3)
Total Bilirubin: 0.4 mg/dL (ref 0.3–1.2)

## 2013-08-17 LAB — LIPID PANEL
CHOL/HDL RATIO: 6 ratio
Cholesterol: 228 mg/dL — ABNORMAL HIGH (ref 0–200)
HDL: 38 mg/dL — ABNORMAL LOW (ref >39)
LDL CALC: 163 mg/dL — AB (ref 0–99)
Triglycerides: 136 mg/dL (ref <150)
VLDL: 27 mg/dL (ref 0–40)

## 2013-08-17 LAB — ETHANOL: Alcohol, Ethyl (B): <11 mg/dL (ref 0–11)

## 2013-08-17 LAB — TSH: TSH: 1.41 u[IU]/mL (ref 0.350–4.500)

## 2013-08-17 LAB — URINE MICROSCOPIC-ADD ON

## 2013-08-17 LAB — PREGNANCY, URINE: Preg Test, Ur: NEGATIVE

## 2013-08-17 MED ORDER — AMLODIPINE BESYLATE 10 MG PO TABS
10.0000 mg | ORAL_TABLET | Freq: Every day | ORAL | Status: DC
  Filled 2013-08-17 (×2): qty 1

## 2013-08-17 MED ORDER — MAGNESIUM HYDROXIDE 400 MG/5ML PO SUSP: 30.0000 mL | Freq: Every day | ORAL | Status: DC | PRN

## 2013-08-17 MED ORDER — AMLODIPINE BESYLATE 10 MG PO TABS
10.0000 mg | ORAL_TABLET | Freq: Every day | ORAL | Status: DC
  Administered 2013-08-17 – 2013-08-22 (×6): 10 mg via ORAL
  Filled 2013-08-17 (×2): qty 1
  Filled 2013-08-17: qty 14
  Filled 2013-08-17: qty 2
  Filled 2013-08-17 (×6): qty 1

## 2013-08-17 MED ORDER — MECLIZINE HCL 25 MG PO TABS
25.0000 mg | ORAL_TABLET | Freq: Three times a day (TID) | ORAL | Status: DC | PRN
  Filled 2013-08-17: qty 1

## 2013-08-17 MED ORDER — LOSARTAN POTASSIUM-HCTZ 100-25 MG PO TABS: 1.0000 | ORAL_TABLET | Freq: Every day | ORAL | Status: DC

## 2013-08-17 MED ORDER — METFORMIN HCL 500 MG PO TABS: 500.0000 mg | ORAL_TABLET | Freq: Two times a day (BID) | ORAL | Status: DC

## 2013-08-17 MED ORDER — LOSARTAN POTASSIUM 50 MG PO TABS
100.0000 mg | ORAL_TABLET | Freq: Every day | ORAL | Status: DC
  Administered 2013-08-17 – 2013-08-22 (×6): 100 mg via ORAL
  Filled 2013-08-17: qty 2
  Filled 2013-08-17: qty 28
  Filled 2013-08-17 (×9): qty 2

## 2013-08-17 MED ORDER — METFORMIN HCL 500 MG PO TABS
500.0000 mg | ORAL_TABLET | Freq: Two times a day (BID) | ORAL | Status: DC
  Administered 2013-08-17 – 2013-08-22 (×10): 500 mg via ORAL
  Filled 2013-08-17: qty 1
  Filled 2013-08-17: qty 28
  Filled 2013-08-17 (×12): qty 1
  Filled 2013-08-17: qty 28
  Filled 2013-08-17 (×2): qty 1

## 2013-08-17 MED ORDER — HYDROCHLOROTHIAZIDE 25 MG PO TABS
25.0000 mg | ORAL_TABLET | Freq: Every day | ORAL | Status: DC
  Administered 2013-08-17 – 2013-08-22 (×6): 25 mg via ORAL
  Filled 2013-08-17 (×10): qty 1

## 2013-08-17 MED ORDER — ALUM & MAG HYDROXIDE-SIMETH 200-200-20 MG/5ML PO SUSP: 30.0000 mL | ORAL | Status: DC | PRN

## 2013-08-17 MED ORDER — AMLODIPINE BESYLATE 10 MG PO TABS: 10.0000 mg | ORAL_TABLET | Freq: Every day | ORAL | Status: DC

## 2013-08-17 MED ORDER — VENLAFAXINE HCL ER 75 MG PO CP24
75.0000 mg | ORAL_CAPSULE | Freq: Every day | ORAL | Status: DC
  Administered 2013-08-17 – 2013-08-18 (×2): 75 mg via ORAL
  Filled 2013-08-17: qty 1
  Filled 2013-08-17: qty 2
  Filled 2013-08-17 (×2): qty 1

## 2013-08-17 MED ORDER — ACETAMINOPHEN 325 MG PO TABS
650.0000 mg | ORAL_TABLET | Freq: Four times a day (QID) | ORAL | Status: DC | PRN
  Administered 2013-08-17 – 2013-08-18 (×3): 650 mg via ORAL
  Filled 2013-08-17 (×3): qty 2

## 2013-08-17 NOTE — Progress Notes (Signed)
[facility] [address] [place] [address] [phone]  Date:  08/17/2013   Name:  Nicole Hubbard   DOB:  1965-11-26   MRN:  [id number]  PCP:  Lamar Blinks, MD    Chief Complaint: Hospitalization Follow-up   History of Present Illness:  Nicole Hubbard is a 48 y.o. very pleasant female patient who presents with the following:  She is here today to discuss updating her FMLA and depression.  I asked her to come in as I have not seen her in a year.  She works for [employer] in Therapist, art.  She notes that her depression is getting worse, and she states that she is having suicidal thoughts.  She is not actively planning suicide but admits she thinks of suicide and "I don't care if I wake up in the morning."   She does not want to get out of bed in the morning, she has panic attacks, and cries a lot.   Sometimes when she is walking up to work she will notice that "my chest tightens, I start to panic."  States that she enjoys her job but does take very stressful calls some of the time. She has done this job for about 20 years.   Here today with her daughter, but she had her daughter leave for discussion.   She is on pristiq and has been on this for a few years.    She has tried to see a psychiatrist but has not been able to do so thus far.  She has not been able to afford to see a private psychiatrist and there is some sort of complex process to see mental health through her job that she has not yet gone through.    Patient Active Problem List   Diagnosis Date Noted  . Morbid obesity 06/30/2012  . Depression 01/26/2012  . Diabetes mellitus, type 2 01/26/2012  . HTN (hypertension) 01/26/2012    Past Medical History  Diagnosis Date  . Depression   . Diabetes mellitus without complication   . Hypertension   . Chest pain   . Migraine     Past Surgical History  Procedure Laterality Date  . Knee surgery    . Tonsillectomy      History  Substance Use Topics   . Smoking status: Former Smoker    Types: Cigarettes    Quit date: 09/15/2008  . Smokeless tobacco: Never Used  . Alcohol Use: Yes    Family History  Problem Relation Age of Onset  . Cancer Father     urithrial    No Known Allergies  Medication list has been reviewed and updated.  Current Outpatient Prescriptions on File Prior to Visit  Medication Sig Dispense Refill  . amLODipine (NORVASC) 10 MG tablet Take 1 tablet (10 mg total) by mouth daily.  30 tablet  11  . desvenlafaxine (PRISTIQ) 50 MG 24 hr tablet Take 1 tablet (50 mg total) by mouth daily.  90 tablet  3  . diazepam (VALIUM) 5 MG tablet Take 1 tablet (5 mg total) by mouth every 8 (eight) hours as needed (Vertigo).  30 tablet  0  . losartan-hydrochlorothiazide (HYZAAR) 100-25 MG per tablet Take 1 tablet by mouth daily.      . meclizine (ANTIVERT) 25 MG tablet Take 1 tablet (25 mg total) by mouth 3 (three) times daily as needed for dizziness.  30 tablet  0   No current facility-administered medications on file prior  to visit.    Review of Systems:  As per HPI- otherwise negative.   Physical Examination: Filed Vitals:   08/17/13 1059  BP: 134/80  Pulse: 91  Temp: 98.2 F (36.8 C)  Resp: 16   Filed Vitals:   08/17/13 1059  Height: 5' 9.5" (1.765 m)  Weight: 324 lb (146.965 kg)   Body mass index is 47.18 kg/(m^2). Ideal Body Weight: Weight in (lb) to have BMI = 25: 171.4   GEN: WDWN, NAD, Non-toxic, Alert & Oriented x 3, morbid obesity HEENT: Atraumatic, Normocephalic.  Ears and Nose: No external deformity. EXTR: No clubbing/cyanosis/edema NEURO: Normal gait.  PSYCH: Normally interactive. Conversant. Not depressed or anxious appearing.  Calm demeanor.    Assessment and Plan: Depression  Essential hypertension - Plan: losartan-hydrochlorothiazide (HYZAAR) 100-25 MG per tablet, amLODipine (NORVASC) 10 MG tablet  Type II or unspecified type diabetes mellitus without mention of complication, not  stated as uncontrolled - Plan: metFORMIN (GLUCOPHAGE) 500 MG tablet  Refilled medications and complete FMLA for her.  Her depression is worse and I am not able to get her in to see a private psychiatrist in a timely manner   She is willing to proceed to [facility] with her daughter who will drive her.  She will go now for further evaluation.     Signed Lamar Blinks, MD

## 2013-08-17 NOTE — Progress Notes (Signed)
Patient ID: Nicole Hubbard, female   DOB: October 14, 1965, 48 y.o.   MRN: [id number]  Veena is a 48 year old female admitted to [facility] for depression and SI with a plan to drive her car into oncoming traffic. Patient reports that her depression has become increasingly worse and she finally noticed that something needed to be done when she had a verbal altercation with another individual at work and received a warning. Patient has a PMH of Diabetes, Hypertension, Migraines, Chest Pain, Depression, and Anxiety. Patient is tearful during the admission process. Patient currently denies SI/HI and A/V hallucinations. Patient reports a headache and report of this was given to [employer] who took over her care. Patient was oriented to the unit and verbalized understanding of the admission process. Q15 minute safety checks were initiated and are maintained. Patient was given food and drink for comfort and nourishment. Patient has no other questions or concerns at this time.

## 2013-08-17 NOTE — Consult Note (Signed)
  Spoke with Waldon Merl, [facility] related to patient TTS assessment.  States that patient complains of suicidal ideation with no plan but unable to contract for safety.  Also consulted with Dr. Lovena Le.   Agreed with TTS assessment and recommend inpatient treatment.    Taiwan Talcott B. Serita Degroote FNP-BC

## 2013-08-17 NOTE — Patient Instructions (Signed)
Please proceed to the [facility] for evaluation of your depression.

## 2013-08-17 NOTE — Tx Team (Signed)
Initial Interdisciplinary Treatment Plan   PATIENT STRESSORS: Medication change or noncompliance   PROBLEM LIST: Problem List/Patient Goals Date to be addressed Date deferred Reason deferred Estimated date of resolution  Risk for Suicide 08/17/2013           Depression 08/17/2013                                          DISCHARGE CRITERIA:  Ability to meet basic life and health needs Improved stabilization in mood, thinking, and/or behavior Verbal commitment to aftercare and medication compliance  PRELIMINARY DISCHARGE PLAN: Attend PHP/IOP Outpatient therapy  PATIENT/FAMIILY INVOLVEMENT: This treatment plan has been presented to and reviewed with the patient, Nicole Hubbard.  The patient and family have been given the opportunity to ask questions and make suggestions.  Gaylan Gerold E 08/17/2013, 4:19 PM

## 2013-08-17 NOTE — Telephone Encounter (Signed)
[place] JUST SAW DR [employer] AND SHE WOULD LIKE A CALL BACK REGARDING HER MOM PLEASE CALL [phone]

## 2013-08-17 NOTE — Progress Notes (Signed)
Adult Psychoeducational Group Note  Date:  08/17/2013 Time:  10:37 PM  Group Topic/Focus:  Wrap-Up Group:   The focus of this group is to help patients review their daily goal of treatment and discuss progress on daily workbooks.  Participation Level:  Active  Participation Quality:  Appropriate  Affect:  Appropriate and Flat  Cognitive:  Appropriate  Insight: Appropriate  Engagement in Group:  Engaged  Modes of Intervention:  Support  Additional Comments:  Pt stated that positive thing that happened is that she admitted that there was a problem and that she need help and was admitted into the hospital/ Her goal for tomorrow is to keep moving forward. Pt was encouraged to use all of her resources and to be assured that she can talk to any of the staff members and that we are all there for her if she needs anything  Matteo Banke 08/17/2013, 10:37 PM

## 2013-08-17 NOTE — Progress Notes (Signed)
Patient ID: Nicole Hubbard, female   DOB: 11-01-1965, 49 y.o.   MRN: [id number] D  --   Pt. Complains of " nagging " headache but asks for no PRN meds at this time.   She is new on unit and appears to be settling in welll.  She is friendly and receptive with staff and interacts well with peers.  She maintains a calm , relaxed affect and shows no negative behaviors at this time.   A---  Support and encouragement offered.  R --  Pt. Remains safe on unit

## 2013-08-17 NOTE — Telephone Encounter (Signed)
Pt needs her FMLA paperwork extended 5 days or 40 hours since she is going to the inpatient treatment facility.

## 2013-08-17 NOTE — BH Assessment (Addendum)
Assessment Note  Nicole Hubbard is an 48 y.o. female with hx of depression. She presents to [facility] as a walk in accompanied by her 85 yr old daughter. She reports increased depression with associated crying spells. Patient is extremely tearful during our assessment. She reports frequent/intense suicidal thoughts with a plan to run her car into oncoming traffic. She has also thought about overdosing stating, "It would be something quick non painful". Patient reports 2 prior suicide attempts as a teenager and young adult (overdose and attempted to jump off a building). She is unable to contract for safety today. Patient's depressive symptoms include hopelessness, loss of interest in usual pleasures, and fatigue. She is employed at [employer] and sts although she goes daily she leaves work to isolate herself. Sts that daughter has attempted to get her out into social settings but pt doesn't feel comfortable around people. Patient does not identify any specific triggers for her increased depression. She sts, "I am stressed about everything it's nothing specific". Additionally, patient has increased anxiety with daily panic attacks. Her appetite is poor and she doesn't sleep well at night.  Patient denies HI and AVH's. She speaks about talking to herself and calling herself demeaning names. Patient currently denies alcohol and drug use. She does admit to a past hx of heavy drinking.    Axis I: Major Depression, Recurrent severe without psychotic features and Anxiety Disorder NOS Axis II: Deferred Axis III:  Past Medical History  Diagnosis Date  . Depression   . Diabetes mellitus without complication   . Hypertension   . Chest pain   . Migraine    Axis IV: other psychosocial or environmental problems, problems related to social environment, problems with access to health care services and problems with primary support group Axis V: 31-40 impairment in reality testing  Past Medical History:  Past Medical  History  Diagnosis Date  . Depression   . Diabetes mellitus without complication   . Hypertension   . Chest pain   . Migraine     Past Surgical History  Procedure Laterality Date  . Knee surgery    . Tonsillectomy      Family History:  Family History  Problem Relation Age of Onset  . Cancer Father     urithrial    Social History:  reports that she quit smoking about 4 years ago. Her smoking use included Cigarettes. She smoked 0.00 packs per day. She has never used smokeless tobacco. She reports that she drinks alcohol. She reports that she does not use illicit drugs.  Additional Social History:  Alcohol / Drug Use Pain Medications: SEE MAR Prescriptions: SEE MAR Over the Counter: SEE MAR History of alcohol / drug use?: No history of alcohol / drug abuse  CIWA:   COWS:    Allergies: No Known Allergies  Home Medications:  (Not in a hospital admission)  OB/GYN Status:  No LMP recorded. Patient has had an ablation.  General Assessment Data Location of Assessment: [facility] ACT Assessment: Yes Is this a Tele or Face-to-Face Assessment?: Face-to-Face Is this an Initial Assessment or a Re-assessment for this encounter?: Initial Assessment Living Arrangements: Children;Other (Comment) (in the household with 75 yr old daughter) Can pt return to current living arrangement?: Yes Admission Status: Voluntary Is patient capable of signing voluntary admission?: Yes Transfer from: [facility] Referral Source: Self/Family/Friend     [place] Living Arrangements: Children;Other (Comment) (in the household with 54 yr old daughter) Name of  Psychiatrist:  (No psychiatrist ) Name of Therapist:  (No therapist )  Education Status Is patient currently in school?: No  Risk to self with the past 6 months Suicidal Ideation: Yes-Currently Present Suicidal Intent: Yes-Currently Present Is patient at risk for suicide?: Yes Suicidal Plan?: Yes-Currently  Present Specify Current Suicidal Plan:  (drive into oncoming traffic, "Something quick & not painful") Access to Means: Yes Specify Access to Suicidal Means:  (ability to drive, access to a car, etc.) What has been your use of drugs/alcohol within the last 12 months?:  (patient denies drug use; past distant hx of alcohol use) Previous Attempts/Gestures: Yes How many times?:  (2x's-overdose and jump off a building) Other Self Harm Risks:  (n/a) Intentional Self Injurious Behavior: None Family Suicide History: No Recent stressful life event(s): Other (Comment) ("everything it's not anything specific") Persecutory voices/beliefs?: No Depression: Yes Depression Symptoms: Feeling angry/irritable;Loss of interest in usual pleasures;Feeling worthless/self pity;Isolating;Fatigue;Guilt;Tearfulness;Insomnia;Despondent Substance abuse history and/or treatment for substance abuse?: No Suicide prevention information given to non-admitted patients: Not applicable  Risk to Others within the past 6 months Homicidal Ideation: No Thoughts of Harm to Others: No Current Homicidal Intent: No Current Homicidal Plan: No Access to Homicidal Means: No Identified Victim:  (n/a) History of harm to others?: No Assessment of Violence: None Noted Violent Behavior Description:  (patient is calm and cooperative ) Does patient have access to weapons?: No Criminal Charges Pending?: No Does patient have a court date: No  Psychosis Hallucinations: None noted ("I talk to myself..call myself ugly.Marland Kitchenstupid") Delusions: None noted  Mental Status Report Appear/Hygiene: Other (Comment) (appropriate ) Eye Contact: Good Motor Activity: Freedom of movement Speech: Logical/coherent Level of Consciousness: Alert Mood: Depressed Affect: Appropriate to circumstance Anxiety Level: None Thought Processes: Coherent;Relevant Judgement: Impaired Orientation: Person;Place;Time;Situation;Appropriate for developmental  age Obsessive Compulsive Thoughts/Behaviors: None  Cognitive Functioning Concentration: Decreased Memory: Remote Intact;Recent Intact IQ: Average Insight: Poor Impulse Control: Poor Appetite: Poor Weight Loss:  ("I've loss some wt. but not sure how much") Weight Gain:  (none reported ) Sleep: Decreased Total Hours of Sleep:  (varies ) Vegetative Symptoms: None  ADLScreening [facility] Assessment Services) Patient's cognitive ability adequate to safely complete daily activities?: Yes Patient able to express need for assistance with ADLs?: Yes Independently performs ADLs?: Yes (appropriate for developmental age)  Prior Inpatient Therapy Prior Inpatient Therapy: No Prior Therapy Dates:  (n/a) Prior Therapy Facilty/Provider(s):  (n/a) Reason for Treatment:  (n/a)  Prior Outpatient Therapy Prior Outpatient Therapy: No Prior Therapy Dates:  (n/a) Prior Therapy Facilty/Provider(s):  (n/a) Reason for Treatment:  (n/a)  ADL Screening (condition at time of admission) Patient's cognitive ability adequate to safely complete daily activities?: Yes Is the patient deaf or have difficulty hearing?: No Does the patient have difficulty seeing, even when wearing glasses/contacts?: No Does the patient have difficulty concentrating, remembering, or making decisions?: Yes Patient able to express need for assistance with ADLs?: Yes Does the patient have difficulty dressing or bathing?: No Independently performs ADLs?: Yes (appropriate for developmental age) Does the patient have difficulty walking or climbing stairs?: No Weakness of Legs: None Weakness of Arms/Hands: None  Home Assistive Devices/Equipment Home Assistive Devices/Equipment: None    Abuse/Neglect Assessment (Assessment to be complete while patient is alone) Physical Abuse: Denies Verbal Abuse: Denies Sexual Abuse: Denies Exploitation of patient/patient's resources: Denies Self-Neglect: Denies Values / Beliefs Cultural  Requests During Hospitalization: None Spiritual Requests During Hospitalization: None   Advance Directives (For Healthcare) Advance Directive: Patient does not have advance directive Nutrition  Screen- [place] Adult/WL/AP Patient's home diet: Regular  Additional Information 1:1 In Past 12 Months?: No CIRT Risk: No Elopement Risk: No Does patient have medical clearance?: Yes     Disposition:  Disposition Initial Assessment Completed for this Encounter: Yes Disposition of Patient: Inpatient treatment program (Patient accepted by Earleen Newport, NP and Dr. Lovena Le) Type of inpatient treatment program: Adult (500 hall )  On Site Evaluation by:   Reviewed with Physician:    Waldon Merl [facility] 08/17/2013 2:53 PM

## 2013-08-18 DIAGNOSIS — I10 Essential (primary) hypertension: Secondary | ICD-10-CM

## 2013-08-18 DIAGNOSIS — F332 Major depressive disorder, recurrent severe without psychotic features: Principal | ICD-10-CM

## 2013-08-18 LAB — HEMOGLOBIN A1C
HEMOGLOBIN A1C: 8.9 % — AB (ref <5.7)
MEAN PLASMA GLUCOSE: 209 mg/dL — AB (ref <117)

## 2013-08-18 LAB — GLUCOSE, CAPILLARY
GLUCOSE-CAPILLARY: 153 mg/dL — AB (ref 70–99)
Glucose-Capillary: 153 mg/dL — ABNORMAL HIGH (ref 70–99)
Glucose-Capillary: 156 mg/dL — ABNORMAL HIGH (ref 70–99)

## 2013-08-18 MED ORDER — VENLAFAXINE HCL ER 37.5 MG PO CP24
37.5000 mg | ORAL_CAPSULE | Freq: Every day | ORAL | Status: DC
  Administered 2013-08-19 – 2013-08-22 (×4): 37.5 mg via ORAL
  Filled 2013-08-18 (×5): qty 1
  Filled 2013-08-18: qty 14
  Filled 2013-08-18: qty 1

## 2013-08-18 MED ORDER — GLUCERNA SHAKE PO LIQD
237.0000 mL | Freq: Two times a day (BID) | ORAL | Status: DC
  Administered 2013-08-18: 237 mL via ORAL

## 2013-08-18 MED ORDER — TRAZODONE HCL 50 MG PO TABS
50.0000 mg | ORAL_TABLET | Freq: Every evening | ORAL | Status: DC | PRN
  Administered 2013-08-18: 50 mg via ORAL
  Filled 2013-08-18: qty 1
  Filled 2013-08-18: qty 14

## 2013-08-18 MED ORDER — ENSURE COMPLETE PO LIQD: 237.0000 mL | Freq: Two times a day (BID) | ORAL | Status: DC

## 2013-08-18 MED ORDER — CITALOPRAM HYDROBROMIDE 20 MG PO TABS
20.0000 mg | ORAL_TABLET | Freq: Every day | ORAL | Status: DC
  Administered 2013-08-18 – 2013-08-22 (×5): 20 mg via ORAL
  Filled 2013-08-18 (×5): qty 1
  Filled 2013-08-18: qty 14
  Filled 2013-08-18 (×3): qty 1

## 2013-08-18 MED ORDER — INSULIN ASPART 100 UNIT/ML ~~LOC~~ SOLN
0.0000 [IU] | Freq: Three times a day (TID) | SUBCUTANEOUS | Status: DC
Start: 2013-08-18 — End: 2013-08-22
  Administered 2013-08-18 – 2013-08-19 (×4): 4 [IU] via SUBCUTANEOUS
  Administered 2013-08-20: 3 [IU] via SUBCUTANEOUS
  Administered 2013-08-20: 12:00:00 via SUBCUTANEOUS
  Administered 2013-08-21: 3 [IU] via SUBCUTANEOUS
  Administered 2013-08-21: 12:00:00 via SUBCUTANEOUS

## 2013-08-18 NOTE — Progress Notes (Signed)
Pt attended group 

## 2013-08-18 NOTE — Progress Notes (Addendum)
NUTRITION ASSESSMENT  Pt identified as at risk on the Malnutrition Screen Tool  INTERVENTION: 1. Educated patient on the importance of nutrition and encouraged intake of food and beverages. Focused on overall healthy eating and provided pt with handouts of this information 2. Supplements: Glucerna shakes BID  NUTRITION DIAGNOSIS: Unintentional weight loss related to sub-optimal intake as evidenced by pt report.   Goal: Pt to meet >/= 90% of their estimated nutrition needs.  Monitor:  PO intake  Assessment:  Admitted with depression and suicidal thoughts with lots of life stressors.   - Met with pt who reports her appetite has been poor for the past 4-5 months and has been eating 1 meal/day - Said she has gone from 357 pounds to 324 pounds during that time frame - Has met with an RD before who gave her a list of foods to eat, and requested healthy diet plan handout, which was provided - States her appetite continues to be poor during admission - Agreeable to getting protein shakes  48 y.o. female  Height: Ht Readings from Last 1 Encounters:  08/17/13 5' 9.5" (1.765 m)    Weight: Wt Readings from Last 1 Encounters:  08/17/13 324 lb (146.965 kg)    Weight Hx: Wt Readings from Last 10 Encounters:  08/17/13 324 lb (146.965 kg)  08/17/13 324 lb (146.965 kg)  09/15/12 332 lb 12.8 oz (150.957 kg)  09/14/12 331 lb (150.141 kg)  06/30/12 331 lb (150.141 kg)  01/26/12 333 lb (151.048 kg)    BMI:  Body mass index is 47.18 kg/(m^2). 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  Estimated Nutritional Needs: Kcal: 25-30 kcal/kg of ideal weight Protein: > 1 gram protein/kg Fluid: 1 ml/kcal  Diet Order: Carb Control Pt is also offered choice of unit snacks mid-morning and mid-afternoon.  Pt is eating as desired.   Lab results and medications reviewed.   Carlis Stable MS, [place], LDN [phone] Pager [phone] Weekend/After Hours Pager

## 2013-08-18 NOTE — [facility] Suicide Risk Assessment (Signed)
   Nursing information obtained from:  Patient Demographic factors:  NA Current Mental Status:  Self-harm thoughts Loss Factors:  NA Historical Factors:  Family history of mental illness or substance abuse Risk Reduction Factors:  Living with another person, especially a relative Total Time spent with patient: 45 minutes  CLINICAL FACTORS:   Depression:   Anhedonia  Psychiatric Specialty Exam: Physical Exam  ROS  Blood pressure 142/98, pulse 97, temperature 97.5 F (36.4 C), temperature source Oral, resp. rate 16, height 5' 9.5" (1.765 m), weight 146.965 kg (324 lb), SpO2 100.00%.Body mass index is 47.18 kg/(m^2).  General Appearance: Fairly Groomed  Engineer, water::  Good  Speech:  Normal Rate  Volume:  Normal  Mood:  Depressed  Affect:  Constricted  Thought Process:  Goal Directed and Linear  Orientation:  NA- fully alert and attentive  Thought Content:  no hallucinations, no delusions  Suicidal Thoughts:  No- at this time denies any thoughts of hurting herself or anyone else  Homicidal Thoughts:  No  Memory:  NA  Judgement:  Fair  Insight:  Fair  Psychomotor Activity:  Normal  Concentration:  Good  Recall:  Good  Fund of Knowledge:Good  Language: Good  Akathisia:  Negative  Handed:  Right  AIMS (if indicated):     Assets:  Communication Skills Desire for Improvement Resilience  Sleep:  Number of Hours: 5.5   Musculoskeletal: Strength & Muscle Tone: within normal limits Gait & Station: normal Patient leans: N/A  COGNITIVE FEATURES THAT CONTRIBUTE TO RISK:  Closed-mindedness    SUICIDE RISK:   Moderate:  Frequent suicidal ideation with limited intensity, and duration, some specificity in terms of plans, no associated intent, good self-control, limited dysphoria/symptomatology, some risk factors present, and identifiable protective factors, including available and accessible social support.  PLAN OF CARE: Patient will be admitted to inpatient psychiatric unit for  stabilization and safety. Will provide and encourage milieu participation. Provide medication management and maked adjustments as needed.  Will follow daily.    I certify that inpatient services furnished can reasonably be expected to improve the patient's condition.  COBOS, [place] 08/18/2013, 1:39 PM

## 2013-08-18 NOTE — [facility] Group Notes (Signed)
[place] LCSW Group Therapy  [facility] 1:15 - 2:30 PM  08/18/2013   Type of Therapy:  Group Therapy  Participation Level: Active  Participation Quality:  Attentive  Affect:  Appropriate  Cognitive:  Appropriate  Insight:  Developing/Improving and Engaged  Engagement in Therapy:  Developing/Improving Engaged  Modes of Intervention:  Discussion, Education, Exploration, Problem-Solving, Rapport Building, Support   Summary of Progress/Problems:  Patient listened attentively to speaker from [place].  Patient stated she intends to follow up and take advantage of services offered.  She thanked the speaker for sharing his story.  Nicole Hubbard 08/18/2013
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
## 2013-08-18 NOTE — Progress Notes (Signed)
The focus of this group is to educate the patient on the purpose and policies of crisis stabilization and provide a format to answer questions about their admission.  The group details unit policies and expectations of patients while admitted.  Patient attended 0900 nurse education orientation group this morning.  Patient actively participated, appropriate affect, alert, appropriate insight and engagement.  Today patient will work on her goals.

## 2013-08-18 NOTE — BHH Suicide Risk Assessment (Signed)
[facility] INPATIENT:  Family/Significant Other Suicide Prevention Education  Suicide Prevention Education:  Education Completed; Rozetta Nunnery, Boyfriend, [phone];  has been identified by the patient as the family member/significant other with whom the patient will be residing, and identified as the person(s) who will aid the patient in the event of a mental health crisis (suicidal ideations/suicide attempt).  With written consent from the patient, the family member/significant other has been provided the following suicide prevention education, prior to the and/or following the discharge of the patient.  The suicide prevention education provided includes the following:  Suicide risk factors  Suicide prevention and interventions  National Suicide Hotline telephone number  [facility] assessment telephone number  [facility] Emergency Assistance [place] and/or Residential Mobile Crisis Unit telephone number  Request made of family/significant other to:  Remove weapons (e.g., guns, rifles, knives), all items previously/currently identified as safety concern.  Boyfriend advised patient does not have access to weapons.    Remove drugs/medications (over-the-counter, prescriptions, illicit drugs), all items previously/currently identified as a safety concern.  The family member/significant other verbalizes understanding of the suicide prevention education information provided.  The family member/significant other agrees to remove the items of safety concern listed above.  Concha Pyo 08/18/2013, 3:57 PM
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
## 2013-08-18 NOTE — Progress Notes (Addendum)
D:  Patient's self inventory sheet, patient has poor sleep, did not request any sleep medication.  Fair appetite, normal energy level, good concentration.  Rated depression and hopeless 5, denied anxiety.   Denied withdrawals.  Denied SI.  Has experienced pain, back pain, headaches in past 24 hours.  Worst pain #7, back/head.  Has been taking pain medication which has not been helpful.  Plans to get better, be positive.  Talk about problems, attend group sessions.  Does have discharge plan.  No problems taking medications. A:  Medications administered per MD orders.  Emotional support and encouragement given patient. R:  Denied SI and HI.  Denied A/V hallucinations.  Safety maintained with 15 minute checks.  [facility] MD came and talked to patient about BP and medications, etc.

## 2013-08-18 NOTE — Consult Note (Addendum)
[facility] Medical Consultation  Nicole Hubbard ZJI:[id number] DOB: 08/22/1965 DOA: 08/17/2013 PCP: Lamar Blinks, MD   Requesting physician: Dr. Parke Poisson Date of consultation: 08/18/13 Reason for consultation: improved blood pressure control  Impression/Recommendations Active Problems:   Diabetes mellitus, type 2 - Relatively well controlled on current regimen. - recommend diabetic diet.    HTN (hypertension) - Currently on amlodipine and losartan on maximum doses. - BP has fluctuated from 124/82- 154/115 - agree with adding HCTZ, may consider spironolactone should pt require further antihypertensive agents - continue to monitor BP's    MDD (major depressive disorder), recurrent severe, without psychosis - Per primary team   Chief Complaint: headaches, depression  HPI:  Pt has a h/o depression (currently in [facility]), DM, HTN. I have been consulted for management of patient's blood pressure given reports of elevated blood pressure associated with headaches.  Pt otherwise denies any blurred vision or abdominal discomfort. No new complaints reported otherwise.  Pt states that she is allergic to Lisinopril as it causes her to cough.  Review of Systems:  14 point review of system negative unless otherwise mentioned above.  Past Medical History  Diagnosis Date  . Depression   . Diabetes mellitus without complication   . Hypertension   . Chest pain   . Migraine    Past Surgical History  Procedure Laterality Date  . Knee surgery    . Tonsillectomy     Social History:  reports that she quit smoking about 4 years ago. Her smoking use included Cigarettes. She smoked 0.00 packs per day. She has never used smokeless tobacco. She reports that she does not drink alcohol or use illicit drugs.  No Known Allergies Family History  Problem Relation Age of Onset  . Cancer Father     urithrial    Prior to Admission medications   Medication Sig Start Date End Date Taking? Authorizing  Provider  diazepam (VALIUM) 5 MG tablet Take 5 mg by mouth every 8 (eight) hours as needed for anxiety.   Yes Historical Provider, MD  diphenhydrAMINE-zinc acetate (BENADRYL) cream Apply 1 application topically daily as needed for itching (to keloid on back).   Yes Historical Provider, MD  meclizine (ANTIVERT) 25 MG tablet Take 25 mg by mouth 3 (three) times daily as needed for dizziness.   Yes Historical Provider, MD   Physical Exam: Blood pressure 142/98, pulse 97, temperature 97.5 F (36.4 C), temperature source Oral, resp. rate 16, height 5' 9.5" (1.765 m), weight 146.965 kg (324 lb), SpO2 100.00%. Filed Vitals:   08/18/13 1326  BP: 142/98  Pulse: 97  Temp:   Resp:      General:  Pt in nad, alert and awake  Eyes: EOMI, non icteric  ENT: normal exterior appearance, no masses on visual examination  Neck: supple, no goiter  Cardiovascular: rrr, no mrg  Respiratory: cta bl, no wheezes  Abdomen: soft, nt, nd  Skin: warm and dry  Musculoskeletal: no cyanosis or clubbing  Psychiatric: mood and affect appropriate  Neurologic: no focal neurological deficits  Labs on Admission:  Basic Metabolic Panel:  Recent Labs Lab 08/17/13 1948  NA 138  K 3.4*  CL 97  CO2 28  GLUCOSE 208*  BUN 9  CREATININE 0.87  CALCIUM 9.9   Liver Function Tests:  Recent Labs Lab 08/17/13 1948  AST 15  14  ALT 12  12  ALKPHOS 72  73  BILITOT 0.5  0.4  PROT 8.6*  8.6*  ALBUMIN 3.8  3.8  No results found for this basename: LIPASE, AMYLASE,  in the last 168 hours No results found for this basename: AMMONIA,  in the last 168 hours CBC:  Recent Labs Lab 08/17/13 1948  WBC 7.3  HGB 13.4  HCT 40.6  MCV 85.8  PLT 321   Cardiac Enzymes: No results found for this basename: CKTOTAL, CKMB, CKMBINDEX, TROPONINI,  in the last 168 hours BNP: No components found with this basename: POCBNP,  CBG:  Recent Labs Lab 08/18/13 1212  GLUCAP 153*    Radiological Exams on  Admission: No results found.   Time spent: 35 minutes  Velvet Bathe [facility] Pager [phone]  If 7PM-7AM, please contact night-coverage [email or web address] Password [email or web address] 08/18/2013, 3:58 PM

## 2013-08-18 NOTE — Consult Note (Signed)
Face to face evaluation and I agree with this note 

## 2013-08-18 NOTE — [facility] Suicide Risk Assessment (Signed)
[place] INPATIENT:  Family/Significant Other Suicide Prevention Education  Suicide Prevention Education:  Education Completed; Gwendel Hanson, Mother, [phone]; has been identified by the patient as the family member/significant other with whom the patient will be residing, and identified as the person(s) who will aid the patient in the event of a mental health crisis (suicidal ideations/suicide attempt).  With written consent from the patient, the family member/significant other has been provided the following suicide prevention education, prior to the and/or following the discharge of the patient.  The suicide prevention education provided includes the following:  Suicide risk factors  Suicide prevention and interventions  National Suicide Hotline telephone number  [facility] assessment telephone number  [facility] Emergency Assistance [place] and/or Residential Mobile Crisis Unit telephone number  Request made of family/significant other to:  Remove weapons (e.g., guns, rifles, knives), all items previously/currently identified as safety concern. Mother advised patient does not have access to weapons.    Remove drugs/medications (over-the-counter, prescriptions, illicit drugs), all items previously/currently identified as a safety concern.  The family member/significant other verbalizes understanding of the suicide prevention education information provided.  The family member/significant other agrees to remove the items of safety concern listed above.  Concha Pyo 08/18/2013, 4:04 PM

## 2013-08-18 NOTE — Progress Notes (Signed)
Resting in bed with eyes closed. Voices no complaints. Libby Maw, RN

## 2013-08-19 LAB — GLUCOSE, CAPILLARY
GLUCOSE-CAPILLARY: 151 mg/dL — AB (ref 70–99)
GLUCOSE-CAPILLARY: 112 mg/dL — AB (ref 70–99)
Glucose-Capillary: 196 mg/dL — ABNORMAL HIGH (ref 70–99)
Glucose-Capillary: 153 mg/dL — ABNORMAL HIGH (ref 70–99)

## 2013-08-19 MED ORDER — POTASSIUM CHLORIDE CRYS ER 20 MEQ PO TBCR
40.0000 meq | EXTENDED_RELEASE_TABLET | Freq: Once | ORAL | Status: AC
  Administered 2013-08-19: 40 meq via ORAL
  Filled 2013-08-19 (×2): qty 2

## 2013-08-19 NOTE — Tx Team (Addendum)
Interdisciplinary Treatment Plan Update   Date Reviewed:  08/19/2013  Time Reviewed:  9:33 AM  Progress in Treatment:   Attending groups: Yes Participating in groups: Yes Taking medication as prescribed: Yes  Tolerating medication: Yes Family/Significant other contact made:   Yes, collateral contact with mother Patient understands diagnosis: Yes  Discussing patient identified problems/goals with staff: Yes Medical problems stabilized or resolved: Yes Denies suicidal/homicidal ideation: Yes Patient has not harmed self or others: Yes  For review of initial/current patient goals, please see plan of care.  Estimated Length of Stay:  2- 3days  Reasons for Continued Hospitalization:  Anxiety Depression Medication stabilization   New Problems/Goals identified:    Discharge Plan or Barriers:   Home with outpatient follow up to be determined  Additional Comments:   Nicole Hubbard is 48 years old, African-American female. She reports, "I walked into this hospital yesterday. I was having suicidal thoughts. A lot has been going on in my life. I'm depressed a lot, stressed and have a lot of issues with my work. Then, I started feeling like, what's the purpose of being in this life? Some days, I feel like I don't want to go on any more. That is why I went to the Urgent care yesterday and talked with my doctor about how I have been feeling. She encouraged me to come in to the hospital. My depression has a lot to do with my 57 year old daughter. She is stressing me out. She recently got very sick, dropped out of school as a result. But, she is feeling a lot better now. However, she is stock in the house, does not want to anything with her life. That is making me very angry and depressed because she is a smart kid. I have been on [place] for several years now. It is not helping my depression".   Attendees:  Patient:  08/19/2013 9:33 AM   Signature:  Gabriel Earing, MD 08/19/2013 9:33 AM  Signature:  08/19/2013 9:33 AM   Signature:   08/19/2013 9:33 AM  Signature: 08/19/2013 9:33 AM  Signature:   08/19/2013 9:33 AM  Signature:  Joette Catching, LCSW 08/19/2013 9:33 AM  Signature:   08/19/2013 9:33 AM  Signature: 08/19/2013 9:33 AM  Signature:   08/19/2013 9:33 AM  Signature: Marilynne Halsted, RN 08/19/2013  9:33 AM  Signature:   Lars Pinks, RN [facility] 08/19/2013  9:33 AM  Signature: 08/19/2013  9:33 AM    Scribe for Treatment Team:   Joette Catching,  08/19/2013 9:33 AM

## 2013-08-19 NOTE — Progress Notes (Signed)
BP stable overnight, 123/91 mmHg this AM. Pt currently on Amlodipine 10 mg PO QD, HCTZ 25 mg PO QD, Losartan 100 mg PO QD. Continue same regimen. If BP persistently > 140/90, consider adding Hydralazine 10 mg PO TID. Will sign off for now and please call us back if you have any questions or concerns.   Faye Ramsay, MD  [facility] Pager [phone] Cell [phone]  If 7PM-7AM, please contact night-coverage [email or web address] Password [email or web address]

## 2013-08-19 NOTE — Progress Notes (Signed)
D: Patient denies SI/HI and A/V hallucinations; patient reports sleep is good; reports appetite is fair ; reports energy level is normal ; reports concentration is good; rates depression as 4/10; rates hopelessness 0/10; rates anxiety as 0/10; patient denies pain  A: Monitored q 15 minutes; patient encouraged to attend groups; patient educated about medications; patient given medications per physician orders; patient encouraged to express feelings and/or concerns  R: Patient is appropriate to circumstances and cooperative; patient is engaging with peers and staff and forwarding information and appears to have some insight; patient was able to set goal to talk with staff 1:1 when having feelings of SI; patient is taking medications as prescribed and tolerating medications; patient is attending all groups

## 2013-08-19 NOTE — Progress Notes (Signed)
[facility] MD Progress Note  08/19/2013 3:56 PM Nicole Hubbard  MRN:  [id number] Subjective:   " I am a little better" Objective:   Patient states she remains depressed, but states that compared to how she was feeling before she came in to the hospital, " I am definitely better". She  Has been going to groups, and states she is benefiting from milieu. Behavior on unit in good control. She states she is looking forward to visit from daughter. Denies medication side effects.   Diagnosis:    Total Time spent with patient: 20 minutes    ADL's:  Improved  Sleep: Improved   Appetite:  Fair   Suicidal Ideation:  Denies  Homicidal Ideation:  Denies  AEB (as evidenced by):  Psychiatric Specialty Exam: Physical Exam  Review of Systems  Constitutional: Negative for fever and chills.  Respiratory: Negative for shortness of breath.   Cardiovascular: Negative for chest pain.  Genitourinary: Negative for dysuria and urgency.  Psychiatric/Behavioral: Positive for depression.    Blood pressure 123/91, pulse 110, temperature 97.7 F (36.5 C), temperature source Oral, resp. rate 20, height 5' 9.5" (1.765 m), weight 146.965 kg (324 lb), SpO2 100.00%.Body mass index is 47.18 kg/(m^2).  General Appearance: Fairly Groomed  Engineer, water::  Good  Speech:  Normal Rate  Volume:  Decreased  Mood:  Depressed, but reports she is improved   Affect:  Constricted, but more reactive  Thought Process:  Goal Directed and Linear  Orientation:  NA- fully alert and attentive  Thought Content:  Denies hallucinations, no delusions, remains ruminative about recent stressors  Suicidal Thoughts:  No- denies any current SI , contracts for safety on the unit  Homicidal Thoughts:  No  Memory:  NA  Judgement:  Fair  Insight:  Fair  Psychomotor Activity:  Normal  Concentration:  Good  Recall:  Good  Fund of Knowledge:Good  Language: Good  Akathisia:  Negative  Handed:  Right  AIMS (if indicated):     Assets:   Communication Skills Desire for Improvement Resilience  Sleep:  Number of Hours: 6.75   Musculoskeletal: Strength & Muscle Tone: within normal limits Gait & Station: normal Patient leans: N/A  Current Medications: Current Facility-Administered Medications  Medication Dose Route Frequency Provider Last Rate Last Dose  . acetaminophen (TYLENOL) tablet 650 mg  650 mg Oral Q6H PRN Shuvon Rankin, NP   650 mg at 08/18/13 1558  . alum & mag hydroxide-simeth (MAALOX/MYLANTA) 200-200-20 MG/5ML suspension 30 mL  30 mL Oral Q4H PRN Shuvon Rankin, NP      . amLODipine (NORVASC) tablet 10 mg  10 mg Oral Daily Shuvon Rankin, NP   10 mg at 08/19/13 0811  . citalopram (CELEXA) tablet 20 mg  20 mg Oral Daily Neita Garnet, MD   20 mg at 08/19/13 7829  . feeding supplement (GLUCERNA SHAKE) (GLUCERNA SHAKE) liquid 237 mL  237 mL Oral BID BM Toribio Harbour, RD   237 mL at 08/18/13 1601  . hydrochlorothiazide (HYDRODIURIL) tablet 25 mg  25 mg Oral Daily Shuvon Rankin, NP   25 mg at 08/19/13 0812  . insulin aspart (novoLOG) injection 0-20 Units  0-20 Units Subcutaneous TID WC Encarnacion Slates, NP   4 Units at 08/19/13 1201  . losartan (COZAAR) tablet 100 mg  100 mg Oral Daily Shuvon Rankin, NP   100 mg at 08/19/13 0811  . magnesium hydroxide (MILK OF MAGNESIA) suspension 30 mL  30 mL Oral Daily PRN Shuvon Rankin, NP      .  meclizine (ANTIVERT) tablet 25 mg  25 mg Oral TID PRN Shuvon Rankin, NP      . metFORMIN (GLUCOPHAGE) tablet 500 mg  500 mg Oral BID WC Shuvon Rankin, NP   500 mg at 08/19/13 0812  . traZODone (DESYREL) tablet 50 mg  50 mg Oral QHS PRN Neita Garnet, MD   50 mg at 08/18/13 2144  . venlafaxine XR (EFFEXOR-XR) 24 hr capsule 37.5 mg  37.5 mg Oral Daily Neita Garnet, MD   37.5 mg at 08/19/13 2992    Lab Results:  Results for orders placed during the hospital encounter of 08/17/13 (from the past 48 hour(s))  PREGNANCY, URINE     Status: None   Collection Time    08/17/13  4:14 PM       Result Value Ref Range   Preg Test, Ur NEGATIVE  NEGATIVE   Comment:            THE SENSITIVITY OF THIS     METHODOLOGY IS >20 mIU/mL.     Performed at [place] (HOSP PERFORMED)     Status: None   Collection Time    08/17/13  4:14 PM      Result Value Ref Range   Opiates NONE DETECTED  NONE DETECTED   Cocaine NONE DETECTED  NONE DETECTED   Benzodiazepines NONE DETECTED  NONE DETECTED   Amphetamines NONE DETECTED  NONE DETECTED   Tetrahydrocannabinol NONE DETECTED  NONE DETECTED   Barbiturates NONE DETECTED  NONE DETECTED   Comment:            DRUG SCREEN FOR MEDICAL PURPOSES     ONLY.  IF CONFIRMATION IS NEEDED     FOR ANY PURPOSE, NOTIFY LAB     WITHIN 5 DAYS.                LOWEST DETECTABLE LIMITS     FOR URINE DRUG SCREEN     Drug Class       Cutoff (ng/mL)     Amphetamine      1000     Barbiturate      200     Benzodiazepine   426     Tricyclics       834     Opiates          300     Cocaine          300     THC              50     Performed at [place] MICROSCOPIC     Status: Abnormal   Collection Time    08/17/13  4:14 PM      Result Value Ref Range   Color, Urine YELLOW  YELLOW   APPearance CLOUDY (*) CLEAR   Specific Gravity, Urine 1.011  1.005 - 1.030   pH 6.0  5.0 - 8.0   Glucose, UA NEGATIVE  NEGATIVE mg/dL   Hgb urine dipstick SMALL (*) NEGATIVE   Bilirubin Urine NEGATIVE  NEGATIVE   Ketones, ur NEGATIVE  NEGATIVE mg/dL   Protein, ur NEGATIVE  NEGATIVE mg/dL   Urobilinogen, UA 0.2  0.0 - 1.0 mg/dL   Nitrite NEGATIVE  NEGATIVE   Leukocytes, UA NEGATIVE  NEGATIVE   Comment: Performed at [place]     Status: None   Collection Time  08/17/13  4:14 PM      Result Value Ref Range   Squamous Epithelial / LPF RARE  RARE   RBC / HPF 3-6  <3 RBC/hpf   Bacteria, UA RARE  RARE   Comment: Performed at [facility]     Status: Abnormal   Collection Time    08/17/13  7:48 PM      Result Value Ref Range   Sodium 138  137 - 147 mEq/L   Potassium 3.4 (*) 3.7 - 5.3 mEq/L   Chloride 97  96 - 112 mEq/L   CO2 28  19 - 32 mEq/L   Glucose, Bld 208 (*) 70 - 99 mg/dL   BUN 9  6 - 23 mg/dL   Creatinine, Ser 0.87  0.50 - 1.10 mg/dL   Calcium 9.9  8.4 - 10.5 mg/dL   Total Protein 8.6 (*) 6.0 - 8.3 g/dL   Albumin 3.8  3.5 - 5.2 g/dL   AST 15  0 - 37 U/L   ALT 12  0 - 35 U/L   Alkaline Phosphatase 72  39 - 117 U/L   Total Bilirubin 0.5  0.3 - 1.2 mg/dL   GFR calc non Af Amer 77 (*) >90 mL/min   GFR calc Af Amer 89 (*) >90 mL/min   Comment: (NOTE)     The eGFR has been calculated using the CKD EPI equation.     This calculation has not been validated in all clinical situations.     eGFR's persistently <90 mL/min signify possible Chronic Kidney     Disease.   Anion gap 13  5 - 15   Comment: Performed at [facility]  LIPID PANEL     Status: Abnormal   Collection Time    08/17/13  7:48 PM      Result Value Ref Range   Cholesterol 228 (*) 0 - 200 mg/dL   Triglycerides 136  <150 mg/dL   HDL 38 (*) >39 mg/dL   Total CHOL/HDL Ratio 6.0     VLDL 27  0 - 40 mg/dL   LDL Cholesterol 163 (*) 0 - 99 mg/dL   Comment:            Total Cholesterol/HDL:CHD Risk     Coronary Heart Disease Risk Table                         Men   Women      1/2 Average Risk   3.4   3.3      Average Risk       5.0   4.4      2 X Average Risk   9.6   7.1      3 X Average Risk  23.4   11.0                Use the calculated Patient Ratio     above and the CHD Risk Table     to determine the patient's CHD Risk.                ATP III CLASSIFICATION (LDL):      <100     mg/dL   Optimal      100-129  mg/dL   Near or Above                        Optimal  130-159  mg/dL   Borderline      160-189  mg/dL   High      >190     mg/dL   Very High     Performed at  [facility]  CBC     Status: None   Collection Time    08/17/13  7:48 PM      Result Value Ref Range   WBC 7.3  4.0 - 10.5 K/uL   RBC 4.73  3.87 - 5.11 MIL/uL   Hemoglobin 13.4  12.0 - 15.0 g/dL   HCT 40.6  36.0 - 46.0 %   MCV 85.8  78.0 - 100.0 fL   MCH 28.3  26.0 - 34.0 pg   MCHC 33.0  30.0 - 36.0 g/dL   RDW 13.8  11.5 - 15.5 %   Platelets 321  150 - 400 K/uL   Comment: Performed at [facility]     Status: None   Collection Time    08/17/13  7:48 PM      Result Value Ref Range   Alcohol, Ethyl (B) <11  0 - 11 mg/dL   Comment:            LOWEST DETECTABLE LIMIT FOR     SERUM ALCOHOL IS 11 mg/dL     FOR MEDICAL PURPOSES ONLY     Performed at [facility]  TSH     Status: None   Collection Time    08/17/13  7:48 PM      Result Value Ref Range   TSH 1.410  0.350 - 4.500 uIU/mL   Comment: Performed at [facility] A1C     Status: Abnormal   Collection Time    08/17/13  7:48 PM      Result Value Ref Range   Hemoglobin A1C 8.9 (*) <5.7 %   Comment: (NOTE)                                                                               According to the ADA Clinical Practice Recommendations for 2011, when     HbA1c is used as a screening test:      >=6.5%   Diagnostic of Diabetes Mellitus               (if abnormal result is confirmed)     5.7-6.4%   Increased risk of developing Diabetes Mellitus     References:Diagnosis and Classification of Diabetes Mellitus,Diabetes     CHEN,2778,24(MPNTI 1):S62-S69 and Standards of Medical Care in             Diabetes - 2011,Diabetes Care,2011,34 (Suppl 1):S11-S61.   Mean Plasma Glucose 209 (*) <117 mg/dL   Comment: Performed at [facility]     Status: Abnormal   Collection Time    08/17/13  7:48 PM      Result Value Ref Range   Total Protein 8.6 (*) 6.0 - 8.3 g/dL   Albumin 3.8  3.5 - 5.2 g/dL   AST 14  0 - 37 U/L   ALT 12  0 -  35 U/L  Alkaline Phosphatase 73  39 - 117 U/L   Total Bilirubin 0.4  0.3 - 1.2 mg/dL   Bilirubin, Direct <0.2  0.0 - 0.3 mg/dL   Indirect Bilirubin NOT CALCULATED  0.3 - 0.9 mg/dL   Comment: Performed at [place], CAP[facility]ARY     Status: Abnormal   Collection Time    08/18/13 12:12 PM      Result Value Ref Range   Glucose-Capillary 153 (*) 70 - 99 mg/dL  GLUCOSE, CAPILLARY     Status: Abnormal   Collection Time    08/18/13  4:51 PM      Result Value Ref Range   Glucose-Capillary 153 (*) 70 - 99 mg/dL  GLUCOSE, CAPILLARY     Status: Abnormal   Collection Time    08/18/13  9:42 PM      Result Value Ref Range   Glucose-Capillary 156 (*) 70 - 99 mg/dL  GLUCOSE, CAPILLARY     Status: Abnormal   Collection Time    08/19/13  6:07 AM      Result Value Ref Range   Glucose-Capillary 151 (*) 70 - 99 mg/dL   Comment 1 Notify RN     Comment 2 Documented in Chart      Physical Findings: AIMS: Facial and Oral Movements Muscles of Facial Expression: None, normal Lips and Perioral Area: None, normal Jaw: None, normal Tongue: None, normal,Extremity Movements Upper (arms, wrists, hands, fingers): None, normal Lower (legs, knees, ankles, toes): None, normal, Trunk Movements Neck, shoulders, hips: None, normal, Overall Severity Severity of abnormal movements (highest score from questions above): None, normal Incapacitation due to abnormal movements: None, normal Patient's awareness of abnormal movements (rate only patient's report): No Awareness, Dental Status Current problems with teeth and/or dentures?: No Does patient usually wear dentures?: No  CIWA:  CIWA-Ar Total: 1 COWS:  COWS Total Score: 2  Assessment; Patient is improving compared to admission. However, she does continue to appear depressed with a constricted affect. She is tolerating medications well. Hospitalist consult appreciated, antihypertensive medications were adjusted , and her BP is now  improved.   Treatment Plan Summary: Daily contact with patient to assess and evaluate symptoms and progress in treatment Medication management See below  Plan: Continue inpatient treatment. Continue CELEXA 20 mgrs QDAY For now continue EFFEXOR XR 37.5 mgrs QDAY- the plan at this time is to taper OFF the Effexor XR, which is being done gradually to minimize risk of Withdrawal symptoms. Weekend MD to consider D/Cing effexor XR if indicated.   Medical Decision Making Problem Points:  Established problem, stable/improving (1), Review of last therapy session (1) and Review of psycho-social stressors (1) Data Points:  Review of medication regiment & side effects (2)  I certify that inpatient services furnished can reasonably be expected to improve the patient's condition.   COBOS, FERNANDO 08/19/2013, 3:56 PM

## 2013-08-19 NOTE — Progress Notes (Signed)
D: Pt denies SI/HI/AVH. Pt is pleasant and cooperative. Pt stated she felt great, she was around positive people.   A: Pt was offered support and encouragement. Pt was given scheduled medications. Pt was encourage to attend groups. Q 15 minute checks were done for safety.   R: Pt is taking medication. Pt has no complaints.Pt receptive to treatment and safety maintained on unit.

## 2013-08-19 NOTE — B[facility] Group Notes (Signed)
[facility] LCSW Aftercare Discharge Planning Group Note   08/19/2013 12:47 PM    Participation Quality:  Appropraite  Mood/Affect:  Appropriate  Depression Rating:  3  Anxiety Rating:  0  Thoughts of Suicide:  No  Will you contract for safety?   NA  Current AVH:  No  Plan for Discharge/Comments:  Patient attended discharge planning group and actively participated in group.  Patient reports doing well.  She will follow up with [facility]. CSW provided all participants with daily workbook.   Transportation Means: Patient has transportation.   Supports:  Patient has a support system.   Tyquarius Paglia, E[place] Post

## 2013-08-19 NOTE — BHH Group Notes (Signed)
[facility] LCSW Group Therapy  Feelings Around Relapse 1:15 -2:30        08/19/2013   Type of Therapy:  Group Therapy  Participation Level:  Appropriate  Participation Quality:  Appropriate  Affect:  Appropriate  Cognitive:  Attentive Appropriate  Insight:  Developing/Improving  Engagement in Therapy: Developing/Improving  Modes of Intervention:  Discussion Exploration Problem-Solving Supportive  Summary of Progress/Problems:  The topic for today was feelings around relapse.    Patient processed feelings toward relapse and was able to relate to peers.  She she has learned that choosing not to take her medications is relapsing and passive SI.  She shared she has stopped taking medications thinking she would have a stroke and die.Patient identified coping skills that can be used to prevent a relapse.   Concha Pyo 08/19/2013

## 2013-08-20 ENCOUNTER — Encounter (HOSPITAL_COMMUNITY): Payer: Self-pay | Admitting: Registered Nurse

## 2013-08-20 DIAGNOSIS — F3289 Other specified depressive episodes: Secondary | ICD-10-CM

## 2013-08-20 DIAGNOSIS — F329 Major depressive disorder, single episode, unspecified: Secondary | ICD-10-CM

## 2013-08-20 LAB — GLUCOSE, CAPILLARY
GLUCOSE-CAPILLARY: 115 mg/dL — AB (ref 70–99)
GLUCOSE-CAPILLARY: 124 mg/dL — AB (ref 70–99)
Glucose-Capillary: 142 mg/dL — ABNORMAL HIGH (ref 70–99)
Glucose-Capillary: 125 mg/dL — ABNORMAL HIGH (ref 70–99)

## 2013-08-20 NOTE — Progress Notes (Signed)
Psychoeducational Group Note  Date: 08/20/2013 Time:  1015  Group Topic/Focus:  Identifying Needs:   The focus of this group is to help patients identify their personal needs that have been historically problematic and identify healthy behaviors to address their needs.  Participation Level:  Active  Participation Quality:  Appropriate  Affect:  Appropriate  Cognitive:  Oriented  Insight:  Improving  Engagement in Group:  Engaged  Additional Comments:  Pt was attentive and participated. Listened attentively to others in the group.  Paulino Rily

## 2013-08-20 NOTE — Progress Notes (Signed)
Nursing Shift Note: D: Positive passive SI. Denies AVH, HI, and pain. Pt able to contract with staff for safety. Mood is depressed. Pt states she wants to discontinue the Glucerna nutrition supplement.  Pt rated depression at a #2. Pt states her daily goal is to have positive thoughts and to utilize resources available to her. A: Support and encouragement given. R: Will continue to monitor in a safe and therapeutic milieu. Pt actively participating in groups. No acute distress noted.

## 2013-08-20 NOTE — Progress Notes (Signed)
Psychoeducational Group Note  Date:  08/20/2013 Time:  0930  Group Topic/Focus:  Identifying Needs:   The focus of this group is to help patients identify their personal needs that have been historically problematic and identify healthy behaviors to address their needs.  Participation Level:  Active  Participation Quality: good   Affect: flat  Cognitive: good  Insight:  good  Engagement in Group: engaged  Additional Comments:

## 2013-08-20 NOTE — Progress Notes (Signed)
D: Pt denies SI/HI/AVH. Pt is pleasant and cooperative. Pt stated she was in a good place and decided to take her medication. Pt appeared to be in a better mood than yesterday and seemed happier.   A: Pt was offered support and encouragement. Pt was given scheduled medications. Pt was encourage to attend groups. Q 15 minute checks were done for safety.   R:Pt attends groups and interacts well with peers and staff. Pt is taking medication. Pt has no complaints at this time .Pt receptive to treatment and safety maintained on unit.

## 2013-08-20 NOTE — [facility] Group Notes (Signed)
[place] Group Notes:  (Clinical Social Work)  08/20/2013   1:15-2:15PM  Summary of Progress/Problems:   The main focus of today's process group was for the patient to identify ways in which they have sabotaged their own mental health wellness/recovery.  Motivational interviewing and a handout were used to explore the benefits and costs of their self-sabotaging behavior as well as the benefits and costs of changing this behavior.  The Stages of Change were explained to the group using a handout, and patients identified where they are with regard to changing self-defeating behaviors.  The patient expressed she self-sabotages with procrastination and living day to day.  She participated fully and was interactive.  Type of Therapy:  Process Group  Participation Level:  Active  Participation Quality:  Attentive and Sharing  Affect:  Depressed and Flat  Cognitive:  Appropriate and Oriented  Insight:  Engaged  Engagement in Therapy:  Engaged  Modes of Intervention:  Education, Motivational Interviewing   Selmer Dominion, LCSW 08/20/2013, 4:00pm

## 2013-08-20 NOTE — Progress Notes (Signed)
[facility] MD Progress Note  08/20/2013 11:49 AM Nicole Hubbard  MRN:  [id number] Subjective:   Patient states that she is here in hospital related to having suicidal thoughts and depression.  Patient states that she is feeling better.  Rates depression 2/10, Patient states to feel some depression.  States that group sessions are helping with her thoughts and triggers.   Patient states that she is tolerating medication well with out adverse effects.      Objective:   Patient continues to participate in group sessions and tolerating medication without adverse effects.   Decrease in depression     Diagnosis:    Total Time spent with patient: 20 minutes    ADL's:  Improved  Sleep: Improved   Appetite:  Fair   Suicidal Ideation:  Denies  Homicidal Ideation:  Denies  AEB (as evidenced by):  Psychiatric Specialty Exam: Physical Exam  Constitutional: She is oriented to person, place, and time.  HENT:  Head: Normocephalic.  Neck: Normal range of motion.  Respiratory: Effort normal.  Musculoskeletal: Normal range of motion.  Neurological: She is alert and oriented to person, place, and time.  Skin: Skin is warm and dry.    Review of Systems  Constitutional: Negative for fever and chills.  Respiratory: Negative for shortness of breath.   Cardiovascular: Negative for chest pain.  Genitourinary: Negative for dysuria and urgency.  Psychiatric/Behavioral: Positive for depression.    Blood pressure 136/87, pulse 89, temperature 97.9 F (36.6 C), temperature source Oral, resp. rate 18, height 5' 9.5" (1.765 m), weight 146.965 kg (324 lb), SpO2 100.00%.Body mass index is 47.18 kg/(m^2).  General Appearance: Fairly Groomed  Engineer, water::  Good  Speech:  Normal Rate  Volume:  Decreased  Mood:  Depressed, but reports she is improved   Affect:  Constricted, but more reactive  Thought Process:  Goal Directed and Linear  Orientation:  NA- fully alert and attentive  Thought Content:  Denies  hallucinations, no delusions, remains ruminative about recent stressors  Suicidal Thoughts:  No- denies any current SI  Homicidal Thoughts:  No  Memory:  NA  Judgement:  Fair  Insight:  Fair  Psychomotor Activity:  Normal  Concentration:  Good  Recall:  Good  Fund of Knowledge:Good  Language: Good  Akathisia:  Negative  Handed:  Right  AIMS (if indicated):     Assets:  Communication Skills Desire for Improvement Resilience  Sleep:  Number of Hours: 5.75   Musculoskeletal: Strength & Muscle Tone: within normal limits Gait & Station: normal Patient leans: N/A  Current Medications: Current Facility-Administered Medications  Medication Dose Route Frequency Provider Last Rate Last Dose  . acetaminophen (TYLENOL) tablet 650 mg  650 mg Oral Q6H PRN Shuvon Rankin, NP   650 mg at 08/18/13 1558  . alum & mag hydroxide-simeth (MAALOX/MYLANTA) 200-200-20 MG/5ML suspension 30 mL  30 mL Oral Q4H PRN Shuvon Rankin, NP      . amLODipine (NORVASC) tablet 10 mg  10 mg Oral Daily Shuvon Rankin, NP   10 mg at 08/20/13 0806  . citalopram (CELEXA) tablet 20 mg  20 mg Oral Daily Neita Garnet, MD   20 mg at 08/20/13 0806  . feeding supplement (GLUCERNA SHAKE) (GLUCERNA SHAKE) liquid 237 mL  237 mL Oral BID BM Toribio Harbour, RD   237 mL at 08/18/13 1601  . hydrochlorothiazide (HYDRODIURIL) tablet 25 mg  25 mg Oral Daily Shuvon Rankin, NP   25 mg at 08/20/13 0806  . insulin  aspart (novoLOG) injection 0-20 Units  0-20 Units Subcutaneous TID WC Encarnacion Slates, NP   3 Units at 08/20/13 [phone]  . losartan (COZAAR) tablet 100 mg  100 mg Oral Daily Shuvon Rankin, NP   100 mg at 08/20/13 0805  . magnesium hydroxide (MILK OF MAGNESIA) suspension 30 mL  30 mL Oral Daily PRN Shuvon Rankin, NP      . meclizine (ANTIVERT) tablet 25 mg  25 mg Oral TID PRN Shuvon Rankin, NP      . metFORMIN (GLUCOPHAGE) tablet 500 mg  500 mg Oral BID WC Shuvon Rankin, NP   500 mg at 08/20/13 0806  . traZODone (DESYREL) tablet 50 mg   50 mg Oral QHS PRN Neita Garnet, MD   50 mg at 08/18/13 2144  . venlafaxine XR (EFFEXOR-XR) 24 hr capsule 37.5 mg  37.5 mg Oral Daily Neita Garnet, MD   37.5 mg at 08/20/13 5726    Lab Results:  Results for orders placed during the hospital encounter of 08/17/13 (from the past 48 hour(s))  GLUCOSE, CAPILLARY     Status: Abnormal   Collection Time    08/18/13 12:12 PM      Result Value Ref Range   Glucose-Capillary 153 (*) 70 - 99 mg/dL  GLUCOSE, CAPILLARY     Status: Abnormal   Collection Time    08/18/13  4:51 PM      Result Value Ref Range   Glucose-Capillary 153 (*) 70 - 99 mg/dL  GLUCOSE, CAPILLARY     Status: Abnormal   Collection Time    08/18/13  9:42 PM      Result Value Ref Range   Glucose-Capillary 156 (*) 70 - 99 mg/dL  GLUCOSE, CAPILLARY     Status: Abnormal   Collection Time    08/19/13  6:07 AM      Result Value Ref Range   Glucose-Capillary 151 (*) 70 - 99 mg/dL   Comment 1 Notify RN     Comment 2 Documented in Chart    GLUCOSE, CAPILLARY     Status: Abnormal   Collection Time    08/19/13 11:42 AM      Result Value Ref Range   Glucose-Capillary 196 (*) 70 - 99 mg/dL   Comment 1 Notify RN     Comment 2 Documented in Chart    GLUCOSE, CAPILLARY     Status: Abnormal   Collection Time    08/19/13  4:32 PM      Result Value Ref Range   Glucose-Capillary 153 (*) 70 - 99 mg/dL  GLUCOSE, CAPILLARY     Status: Abnormal   Collection Time    08/19/13  7:57 PM      Result Value Ref Range   Glucose-Capillary 112 (*) 70 - 99 mg/dL  GLUCOSE, CAPILLARY     Status: Abnormal   Collection Time    08/20/13  6:38 AM      Result Value Ref Range   Glucose-Capillary 142 (*) 70 - 99 mg/dL    Physical Findings: AIMS: Facial and Oral Movements Muscles of Facial Expression: None, normal Lips and Perioral Area: None, normal Jaw: None, normal Tongue: None, normal,Extremity Movements Upper (arms, wrists, hands, fingers): None, normal Lower (legs, knees, ankles, toes):  None, normal, Trunk Movements Neck, shoulders, hips: None, normal, Overall Severity Severity of abnormal movements (highest score from questions above): None, normal Incapacitation due to abnormal movements: None, normal Patient's awareness of abnormal movements (rate only patient's report): No Awareness, Dental Status  Current problems with teeth and/or dentures?: No Does patient usually wear dentures?: No  CIWA:  CIWA-Ar Total: 1 COWS:  COWS Total Score: 2  Assessment; Patient is improving compared to admission. However, she does continue to appear depressed with a constricted affect. She is tolerating medications well. Hospitalist consult appreciated, antihypertensive medications were adjusted , and her BP is now improved.   Treatment Plan Summary: Daily contact with patient to assess and evaluate symptoms and progress in treatment Medication management See below  Plan: Continue inpatient treatment. Continue CELEXA 20 mgrs QDAY For now continue EFFEXOR XR 37.5 mgrs QDAY- the plan at this time is to taper OFF the Effexor XR, which is being done gradually to minimize risk of Withdrawal symptoms.   Will continue current treatment plan.  Will have SW to set patient up for outpatient services (medication management and therapy).  If continue to improve possible discharge on Monday.   Medical Decision Making Problem Points:  Established problem, stable/improving (1), Review of last therapy session (1) and Review of psycho-social stressors (1) Data Points:  Review of medication regiment & side effects (2)  I certify that inpatient services furnished can reasonably be expected to improve the patient's condition.   Rankin, Shuvon, FNP-BC 08/20/2013, 11:49 AM  I agreed with the findings, treatment and disposition plan of this patient. Berniece Andreas, MD

## 2013-08-21 LAB — GLUCOSE, CAPILLARY
GLUCOSE-CAPILLARY: 119 mg/dL — AB (ref 70–99)
Glucose-Capillary: 126 mg/dL — ABNORMAL HIGH (ref 70–99)
Glucose-Capillary: 148 mg/dL — ABNORMAL HIGH (ref 70–99)
Glucose-Capillary: 126 mg/dL — ABNORMAL HIGH (ref 70–99)

## 2013-08-21 NOTE — Progress Notes (Signed)
Adult Psychoeducational Group Note  Date:  08/21/2013 Time:  12:54 AM  Group Topic/Focus:  Wrap-Up Group:   The focus of this group is to help patients review their daily goal of treatment and discuss progress on daily workbooks.  Participation Level:  Active  Participation Quality:  Appropriate  Affect:  Appropriate  Cognitive:  Appropriate  Insight: Appropriate  Engagement in Group:  Engaged  Modes of Intervention:  Discussion  Additional Comments: The patient expressed that she learned in group that she has the power to control her emotions.  Nicole Hubbard 08/21/2013, 12:54 AM

## 2013-08-21 NOTE — Progress Notes (Signed)
[facility] MD Progress Note  08/21/2013 11:52 AM Nicole Hubbard  MRN:  [id number] Subjective:   Patient states "I really am feel a lot better.  I just didn't get any sleep last night cause of my room out.  I had to tell the staff you got to move me or her.  She was cussing the staff and yelling."  Patient states that she doesn't want the glucerna because of the texture and she doesn't like it.  The meals are not really for diabetics and I wasn't eating because of the food.  I am trying to do right and eat right.  I haven't had and suicidal thoughts.  Despite how it was last night I feel good this morning.  Rates depression 1/10 and anxiety 0/10.   I am feeling much better"     Objective:   Patient continues to participate in group sessions and tolerating medication without adverse effects.   Decrease in depression     Diagnosis:    Total Time spent with patient: 20 minutes    ADL's:  Improved  Sleep: Improved   Appetite:  Fair   Suicidal Ideation:  Denies  Homicidal Ideation:  Denies  AEB (as evidenced by):  Psychiatric Specialty Exam: Physical Exam  Constitutional: She is oriented to person, place, and time.  HENT:  Head: Normocephalic.  Neck: Normal range of motion.  Respiratory: Effort normal.  Musculoskeletal: Normal range of motion.  Neurological: She is alert and oriented to person, place, and time.  Skin: Skin is warm and dry.    Review of Systems  Constitutional: Negative for fever and chills.  Respiratory: Negative for shortness of breath.   Cardiovascular: Negative for chest pain.  Genitourinary: Negative for dysuria and urgency.  Psychiatric/Behavioral: Positive for depression.    Blood pressure 155/100, pulse 100, temperature 97.8 F (36.6 C), temperature source Oral, resp. rate 20, height 5' 9.5" (1.765 m), weight 146.965 kg (324 lb), SpO2 100.00%.Body mass index is 47.18 kg/(m^2).  General Appearance: Fairly Groomed  Engineer, water::  Good  Speech:  Normal Rate   Volume:  Decreased  Mood:  Depressed, but reports she is improved   Affect:  Constricted, but more reactive  Thought Process:  Circumstantial and Goal Directed  Orientation:  NA- fully alert and attentive  Thought Content:  "I'm feeling much better"  Suicidal Thoughts:  No- denies any current SI  Homicidal Thoughts:  No  Memory:  NA  Judgement:  Fair  Insight:  Fair  Psychomotor Activity:  Normal  Concentration:  Good  Recall:  Good  Fund of Knowledge:Good  Language: Good  Akathisia:  Negative  Handed:  Right  AIMS (if indicated):     Assets:  Communication Skills Desire for Improvement Resilience  Sleep:  Number of Hours: 5.75   Musculoskeletal: Strength & Muscle Tone: within normal limits Gait & Station: normal Patient leans: N/A  Current Medications: Current Facility-Administered Medications  Medication Dose Route Frequency Provider Last Rate Last Dose  . acetaminophen (TYLENOL) tablet 650 mg  650 mg Oral Q6H PRN Shuvon Rankin, NP   650 mg at 08/18/13 1558  . alum & mag hydroxide-simeth (MAALOX/MYLANTA) 200-200-20 MG/5ML suspension 30 mL  30 mL Oral Q4H PRN Shuvon Rankin, NP      . amLODipine (NORVASC) tablet 10 mg  10 mg Oral Daily Shuvon Rankin, NP   10 mg at 08/21/13 1022  . citalopram (CELEXA) tablet 20 mg  20 mg Oral Daily Neita Garnet, MD   20  mg at 08/21/13 1020  . feeding supplement (GLUCERNA SHAKE) (GLUCERNA SHAKE) liquid 237 mL  237 mL Oral BID BM Toribio Harbour, RD   237 mL at 08/18/13 1601  . hydrochlorothiazide (HYDRODIURIL) tablet 25 mg  25 mg Oral Daily Shuvon Rankin, NP   25 mg at 08/21/13 1022  . insulin aspart (novoLOG) injection 0-20 Units  0-20 Units Subcutaneous TID WC Encarnacion Slates, NP   3 Units at 08/21/13 [phone]  . losartan (COZAAR) tablet 100 mg  100 mg Oral Daily Shuvon Rankin, NP   100 mg at 08/21/13 1022  . magnesium hydroxide (MILK OF MAGNESIA) suspension 30 mL  30 mL Oral Daily PRN Shuvon Rankin, NP      . meclizine (ANTIVERT) tablet 25  mg  25 mg Oral TID PRN Shuvon Rankin, NP      . metFORMIN (GLUCOPHAGE) tablet 500 mg  500 mg Oral BID WC Shuvon Rankin, NP   500 mg at 08/21/13 1022  . traZODone (DESYREL) tablet 50 mg  50 mg Oral QHS PRN Neita Garnet, MD   50 mg at 08/18/13 2144  . venlafaxine XR (EFFEXOR-XR) 24 hr capsule 37.5 mg  37.5 mg Oral Daily Neita Garnet, MD   37.5 mg at 08/21/13 1022    Lab Results:  Results for orders placed during the hospital encounter of 08/17/13 (from the past 48 hour(s))  GLUCOSE, CAPILLARY     Status: Abnormal   Collection Time    08/19/13  4:32 PM      Result Value Ref Range   Glucose-Capillary 153 (*) 70 - 99 mg/dL  GLUCOSE, CAPILLARY     Status: Abnormal   Collection Time    08/19/13  7:57 PM      Result Value Ref Range   Glucose-Capillary 112 (*) 70 - 99 mg/dL  GLUCOSE, CAPILLARY     Status: Abnormal   Collection Time    08/20/13  6:38 AM      Result Value Ref Range   Glucose-Capillary 142 (*) 70 - 99 mg/dL  GLUCOSE, CAPILLARY     Status: Abnormal   Collection Time    08/20/13 12:08 PM      Result Value Ref Range   Glucose-Capillary 125 (*) 70 - 99 mg/dL  GLUCOSE, CAPILLARY     Status: Abnormal   Collection Time    08/20/13  5:19 PM      Result Value Ref Range   Glucose-Capillary 115 (*) 70 - 99 mg/dL  GLUCOSE, CAPILLARY     Status: Abnormal   Collection Time    08/20/13  8:50 PM      Result Value Ref Range   Glucose-Capillary 124 (*) 70 - 99 mg/dL  GLUCOSE, CAPILLARY     Status: Abnormal   Collection Time    08/21/13  6:08 AM      Result Value Ref Range   Glucose-Capillary 126 (*) 70 - 99 mg/dL   Comment 1 Notify RN      Physical Findings: AIMS: Facial and Oral Movements Muscles of Facial Expression: None, normal Lips and Perioral Area: None, normal Jaw: None, normal Tongue: None, normal,Extremity Movements Upper (arms, wrists, hands, fingers): None, normal Lower (legs, knees, ankles, toes): None, normal, Trunk Movements Neck, shoulders, hips: None,  normal, Overall Severity Severity of abnormal movements (highest score from questions above): None, normal Incapacitation due to abnormal movements: None, normal Patient's awareness of abnormal movements (rate only patient's report): No Awareness, Dental Status Current problems with teeth and/or dentures?:  No Does patient usually wear dentures?: No  CIWA:  CIWA-Ar Total: 1 COWS:  COWS Total Score: 2  Assessment; Patient continues to improve. Patient no longer has that appearance of depression.  Patient is participating in group sessions and tolerating medications.Patient states that she is eating better just slim choice of food.   Treatment Plan Summary: Daily contact with patient to assess and evaluate symptoms and progress in treatment Medication management See below  Plan: Continue inpatient treatment. Continue CELEXA 20 mgrs QDAY For now continue EFFEXOR XR 37.5 mgrs QDAY- the plan at this time is to taper OFF the Effexor XR, which is being done gradually to minimize risk of Withdrawal symptoms.   Will continue current treatment plan.  Will have SW to set patient up for outpatient services (medication management and therapy).  If continue to improve possible discharge on Monday.  Continue wit current treatment plan  Medical Decision Making Problem Points:  Established problem, stable/improving (1), Review of last therapy session (1) and Review of psycho-social stressors (1) Data Points:  Review or order clinical lab tests (1) Review of medication regiment & side effects (2)  I certify that inpatient services furnished can reasonably be expected to improve the patient's condition.   Rankin, Shuvon, FNP-BC 08/21/2013, 11:52 AM  I agreed with the findings, treatment and disposition plan of this patient. Berniece Andreas, MD

## 2013-08-21 NOTE — Progress Notes (Signed)
Nursing Shift Note: D: Pt slept in quiet room due to loud milieu. Pt awoke at 10am. Pt offered breakfast and medication administered. Pt refused Glucerna. Pt cooperative and pleasant. Pt rates depression and anxiety low. A: Pt attended group with good participation. R: Q15 minute checks for safety.

## 2013-08-21 NOTE — Progress Notes (Signed)
Patient ID: Nicole Hubbard, female   DOB: 31-May-1965, 48 y.o.   MRN: [id number] D)  Has been in the dayroom this evening, interacting appropriately with staff and select peers. Attended group this evening,  stayed afterward to watch tv, has had no requests, affect is sad, depressed.  Stated she had learned in group today that she can control her emotions, and that she doesn't have to absorb what others may say to her or about her, and allow it to effect her.   A)  Will continue to monitor for safety, continue POC, support R)  Remains safe on unit.

## 2013-08-21 NOTE — Progress Notes (Signed)
Psychoeducational Group Note  Date:  08/21/2013 Time:  1015  Group Topic/Focus:  Making Healthy Choices:   The focus of this group is to help patients identify negative/unhealthy choices they were using prior to admission and identify positive/healthier coping strategies to replace them upon discharge.  Participation Level:  Active  Participation Quality:  Attentive  Affect:  Appropriate  Cognitive:  Oriented  Insight:  Improving  Engagement in Group:  Engaged  Additional Comments:  Pt attended group and participated in the discussion  Paulino Rily 08/21/2013

## 2013-08-21 NOTE — [facility] Group Notes (Signed)
[place] Group Notes:  (Clinical Social Work)  08/21/2013   1:15-2:15PM  Summary of Progress/Problems:  The main focus of today's process group was to   identify the patient's current support system and decide on other supports that can be put in place.  The picture on workbook was used to discuss why additional supports are needed.  An emphasis was placed on using counselor, doctor, therapy groups, 12-step groups, and problem-specific support groups to expand supports.   There was also an extensive discussion about what constitutes a healthy support versus an unhealthy support.  The patient expressed full comprehension of the concepts presented.  One current health support is her family, which includes her mother, sister and daughter.  She stated she feels good about her supports.  She used to enjoy swimming and is interested in possibly doing that again.  She states that she has difficulty getting a bus pass or rides to different places because she is not working and does not have an income.  Type of Therapy:  Process Group  Participation Level:  Active  Participation Quality:  Attentive and Sharing  Affect:  Blunted and Depressed  Cognitive:  Appropriate and Oriented  Insight:  Engaged  Engagement in Therapy:  Engaged  Modes of Intervention:  Education,  Support and [employer], LCSW 08/21/2013, 4:00pm

## 2013-08-21 NOTE — Progress Notes (Signed)
Psychoeducational Group Note  Date: 08/21/2013 Time:  0930  Group Topic/Focus:  Gratefulness:  The focus of this group is to help patients identify what two things they are most grateful for in their lives. What helps ground them and to center them on their work to their recovery.  Participation Level:  Active  Participation Quality:  Appropriate  Affect:  appropriate  Cognitive:  Oriented  Insight:  Improving  Engagement in Group:  Engaged  Additional Comments:  Attended the group and participated    Nicole Hubbard

## 2013-08-22 ENCOUNTER — Telehealth: Payer: Self-pay

## 2013-08-22 LAB — GLUCOSE, CAPILLARY
GLUCOSE-CAPILLARY: 113 mg/dL — AB (ref 70–99)
Glucose-Capillary: 114 mg/dL — ABNORMAL HIGH (ref 70–99)

## 2013-08-22 MED ORDER — CITALOPRAM HYDROBROMIDE 20 MG PO TABS
20.0000 mg | ORAL_TABLET | Freq: Every day | ORAL | Status: DC
Start: 2013-08-22 — End: 2013-09-21

## 2013-08-22 MED ORDER — METFORMIN HCL 500 MG PO TABS: 500.0000 mg | ORAL_TABLET | Freq: Two times a day (BID) | ORAL | Status: DC

## 2013-08-22 MED ORDER — TRAZODONE HCL 50 MG PO TABS: 50.0000 mg | ORAL_TABLET | Freq: Every evening | ORAL | Status: DC | PRN

## 2013-08-22 MED ORDER — AMLODIPINE BESYLATE 10 MG PO TABS: 10.0000 mg | ORAL_TABLET | Freq: Every day | ORAL | Status: DC

## 2013-08-22 MED ORDER — MECLIZINE HCL 25 MG PO TABS: 25.0000 mg | ORAL_TABLET | Freq: Three times a day (TID) | ORAL | Status: DC | PRN

## 2013-08-22 MED ORDER — VENLAFAXINE HCL ER 37.5 MG PO CP24
37.5000 mg | ORAL_CAPSULE | Freq: Every day | ORAL | Status: DC
Start: 2013-08-22 — End: 2013-09-21

## 2013-08-22 MED ORDER — LOSARTAN POTASSIUM 100 MG PO TABS: 100.0000 mg | ORAL_TABLET | Freq: Every day | ORAL | Status: DC

## 2013-08-22 MED ORDER — HYDROCHLOROTHIAZIDE 25 MG PO TABS: 25.0000 mg | ORAL_TABLET | Freq: Every day | ORAL | Status: DC

## 2013-08-22 NOTE — Telephone Encounter (Signed)
Patient wants to talk to Dr Lorelei Pont about discharge and fmla paperwork from behavioral health

## 2013-08-22 NOTE — Progress Notes (Signed)
Discharge Note:  Patient discharge home.  Denied SI and HI.  Denied A/V hallucinations.  Suicide prevention information given and discussed with patient who stated she understood and had no questions.  Patient stated she received all her belongings, clothing, misc items, toiletries, prescriptions, medications.  Patient stated she appreciated all assistance received from [facility] staff.

## 2013-08-22 NOTE — Discharge Summary (Signed)
Physician Discharge Summary Note  Patient:  Nicole Hubbard is an 48 y.o., female MRN:  [id number] DOB:  1965-05-07 Patient phone:  [phone] (home)  Patient address:   [address] [place] [address],  Total Time spent with patient: Greter than 30 minutes  Date of Admission:  08/17/2013 Date of Discharge: 08/22/13  Reason for Admission: Mood stabilization  Discharge Diagnoses: Active Problems:   Diabetes mellitus, type 2   HTN (hypertension)   MDD (major depressive disorder), recurrent severe, without psychosis   Psychiatric Specialty Exam: Physical Exam  Psychiatric: Her speech is normal and behavior is normal. Judgment and thought content normal. Her mood appears not anxious. Her affect is not angry, not blunt, not labile and not inappropriate. Cognition and memory are normal. She does not exhibit a depressed mood.    Review of Systems  Constitutional: Negative.   HENT: Negative.   Eyes: Negative.   Cardiovascular: Negative.   Gastrointestinal: Negative.   Genitourinary: Negative.   Musculoskeletal: Negative.   Skin: Negative.   Neurological: Negative.   Endo/Heme/Allergies: Negative.   Psychiatric/Behavioral: Positive for depression (Stable). Negative for suicidal ideas, hallucinations, memory loss and substance abuse. The patient has insomnia (Stable). The patient is not nervous/anxious.     Blood pressure 108/73, pulse 98, temperature 98.7 F (37.1 C), temperature source Oral, resp. rate 18, height 5' 9.5" (1.765 m), weight 146.965 kg (324 lb), SpO2 100.00%.Body mass index is 47.18 kg/(m^2).  General Appearance: Fairly Groomed  Engineer, water:: Fair  Speech: Clear and Coherent  Volume: Normal  Mood: Anxious and worried  Affect: worried  Thought Process: Coherent and Goal Directed  Orientation: Full (Time, Place, and Person)  Thought Content: worries, concerns as she moves on  Suicidal Thoughts: No  Homicidal Thoughts: No Memory: Immediate; Fair  Recent;  Fair  Remote; Fair  Judgement: Fair  Insight: Present  Psychomotor Activity: Restlessness  Concentration: Fair  Recall: Nicole Hubbard of Knowledge:NA  Language: Fair  Akathisia: No  Handed:  AIMS (if indicated): Assets: Desire for Improvement  Housing  Vocational/Educational  Sleep: Number of Hours: 6.75   Past Psychiatric History: Diagnosis: MDD (major depressive disorder), recurrent severe, without psychosis  Hospitalizations: [facility] adult unit  Outpatient Care: Fredd May for counseling sessions, Dr. Adele Schilder for medication management.  Substance Abuse Care: NA  Self-Mutilation: NA  Suicidal Attempts: NA  Violent Behaviors: NA   Musculoskeletal: Strength & Muscle Tone: within normal limits Gait & Station: normal Patient leans: N/A  DSM5: Schizophrenia Disorders:  NA Obsessive-Compulsive Disorders:  NA Trauma-Stressor Disorders:  NA Substance/Addictive Disorders:  NA Depressive Disorders:  MDD (major depressive disorder), recurrent severe, without psychosis   Axis Diagnosis:  AXIS I:  MDD (major depressive disorder), recurrent severe, without psychosis AXIS II:  Deferred AXIS III:   Past Medical History  Diagnosis Date  . Depression   . Diabetes mellitus without complication   . Hypertension   . Chest pain   . Migraine    AXIS IV:  other psychosocial or environmental problems and Familial/work related stressor AXIS V:  64  Level of Care:  OP  Hospital Course:  I walked into this hospital yesterday. I was having suicidal thoughts. A lot has been going on in my life. I'm depressed a lot, stressed and have a lot of issues with my work. Then, I started feeling like, what's the purpose of being in this life? Some days, I feel like I don't want to go on any more. That is why  I went to the Urgent care yesterday and talked with my doctor about how I have been feeling. She encouraged me to come in to the hospital. My depression has a lot to do with my 21 year old daughter. She  is stressing me out. She recently got very sick, dropped out of school as a result.   Nicole Hubbard was admitted to the hospital with complain of worsening depression and suicidal ideations. She stated on admission that she was on an antidepressant medicine that she believed was not controlling her symptoms. She needed medication adjustment to re-stabilize her mood. She was then treated and discharged on Citalopram 20 mg daily for depression, Effexor XR 37.5 daily for depression and Trazodone 50 mg Q bedtime for sleep. She also enrolled and participated in the group counseling sessions being offered and held on this unit. She learned coping skills.   Nicole Hubbard's symptoms responded well to her ordered treatment regimen. This is evidenced by her reports of improved mood and absence of suicidal ideations. She is currently being discharged to continue psychiatric treatment at the [place] outpatient clinic with Dr. Adele Schilder and the counseling sessions will be provided for her by Fred May. She is provided with all the necessary information required to make these appointments without problems.   Upon discharge, she adamantly denies any SIHI, AVH, delusional thoughts and or paranoia. She left [facility] with all personal belongings in no distress. She received a 14 days worth, supply samples of her discharge medications. She left [facility] with all belongings in no distress. Transportation per patient's own arrangement.  Consults:  psychiatry  Significant Diagnostic Studies:  labs: CBC with diff, CMP, UDS, toxicology tests, U/A  Discharge Vitals:   Blood pressure 108/73, pulse 98, temperature 98.7 F (37.1 C), temperature source Oral, resp. rate 18, height 5' 9.5" (1.765 m), weight 146.965 kg (324 lb), SpO2 100.00%. Body mass index is 47.18 kg/(m^2). Lab Results:   Results for orders placed during the hospital encounter of 08/17/13 (from the past 72 hour(s))  GLUCOSE, CAPILLARY     Status: Abnormal   Collection Time    08/19/13   4:32 PM      Result Value Ref Range   Glucose-Capillary 153 (*) 70 - 99 mg/dL  GLUCOSE, CAPILLARY     Status: Abnormal   Collection Time    08/19/13  7:57 PM      Result Value Ref Range   Glucose-Capillary 112 (*) 70 - 99 mg/dL  GLUCOSE, CAPILLARY     Status: Abnormal   Collection Time    08/20/13  6:38 AM      Result Value Ref Range   Glucose-Capillary 142 (*) 70 - 99 mg/dL  GLUCOSE, CAPILLARY     Status: Abnormal   Collection Time    08/20/13 12:08 PM      Result Value Ref Range   Glucose-Capillary 125 (*) 70 - 99 mg/dL  GLUCOSE, CAPILLARY     Status: Abnormal   Collection Time    08/20/13  5:19 PM      Result Value Ref Range   Glucose-Capillary 115 (*) 70 - 99 mg/dL  GLUCOSE, CAPILLARY     Status: Abnormal   Collection Time    08/20/13  8:50 PM      Result Value Ref Range   Glucose-Capillary 124 (*) 70 - 99 mg/dL  GLUCOSE, CAPILLARY     Status: Abnormal   Collection Time    08/21/13  6:08 AM      Result  Value Ref Range   Glucose-Capillary 126 (*) 70 - 99 mg/dL   Comment 1 Notify RN    GLUCOSE, CAPILLARY     Status: Abnormal   Collection Time    08/21/13 12:09 PM      Result Value Ref Range   Glucose-Capillary 148 (*) 70 - 99 mg/dL  GLUCOSE, CAPILLARY     Status: Abnormal   Collection Time    08/21/13  5:08 PM      Result Value Ref Range   Glucose-Capillary 119 (*) 70 - 99 mg/dL  GLUCOSE, CAPILLARY     Status: Abnormal   Collection Time    08/21/13  8:49 PM      Result Value Ref Range   Glucose-Capillary 126 (*) 70 - 99 mg/dL   Comment 1 Notify RN    GLUCOSE, CAPILLARY     Status: Abnormal   Collection Time    08/22/13  6:25 AM      Result Value Ref Range   Glucose-Capillary 114 (*) 70 - 99 mg/dL  GLUCOSE, CAPILLARY     Status: Abnormal   Collection Time    08/22/13 11:50 AM      Result Value Ref Range   Glucose-Capillary 113 (*) 70 - 99 mg/dL    Physical Findings: AIMS: Facial and Oral Movements Muscles of Facial Expression: None, normal Lips and  Perioral Area: None, normal Jaw: None, normal Tongue: None, normal,Extremity Movements Upper (arms, wrists, hands, fingers): None, normal Lower (legs, knees, ankles, toes): None, normal, Trunk Movements Neck, shoulders, hips: None, normal, Overall Severity Severity of abnormal movements (highest score from questions above): None, normal Incapacitation due to abnormal movements: None, normal Patient's awareness of abnormal movements (rate only patient's report): No Awareness, Dental Status Current problems with teeth and/or dentures?: No Does patient usually wear dentures?: No  CIWA:  CIWA-Ar Total: 0 COWS:  COWS Total Score: 1  Psychiatric Specialty Exam: See Psychiatric Specialty Exam and Suicide Risk Assessment completed by Attending Physician prior to discharge.  Discharge destination:  Home  Is patient on multiple antipsychotic therapies at discharge:  No   Has Patient had three or more failed trials of antipsychotic monotherapy by history:  No  Recommended Plan for Multiple Antipsychotic Therapies: NA    Medication List    STOP taking these medications       desvenlafaxine 50 MG 24 hr tablet  Commonly known as:  PRISTIQ  Replaced by:  venlafaxine XR 37.5 MG 24 hr capsule     diazepam 5 MG tablet  Commonly known as:  VALIUM     diphenhydrAMINE-zinc acetate cream  Commonly known as:  BENADRYL      TAKE these medications     Indication   amLODipine 10 MG tablet  Commonly known as:  NORVASC  Take 1 tablet (10 mg total) by mouth daily. For high blood pressure   Indication:  High Blood Pressure     citalopram 20 MG tablet  Commonly known as:  CELEXA  Take 1 tablet (20 mg total) by mouth daily. For depression   Indication:  Depression     hydrochlorothiazide 25 MG tablet  Commonly known as:  HYDRODIURIL  Take 1 tablet (25 mg total) by mouth daily. For high blood pressure   Indication:  High Blood Pressure     losartan 100 MG tablet  Commonly known as:  COZAAR   Take 1 tablet (100 mg total) by mouth daily. For high blood pressure   Indication:  High Blood  Pressure     meclizine 25 MG tablet  Commonly known as:  ANTIVERT  Take 1 tablet (25 mg total) by mouth 3 (three) times daily as needed for dizziness.   Indication:  Sensation of Spinning or Whirling, Dizziness     metFORMIN 500 MG tablet  Commonly known as:  GLUCOPHAGE  Take 1 tablet (500 mg total) by mouth 2 (two) times daily with a meal. For diabetes management   Indication:  Type 2 Diabetes     traZODone 50 MG tablet  Commonly known as:  DESYREL  Take 1 tablet (50 mg total) by mouth at bedtime as needed for sleep (Insomnia).   Indication:  Trouble Sleeping     venlafaxine XR 37.5 MG 24 hr capsule  Commonly known as:  EFFEXOR-XR  Take 1 capsule (37.5 mg total) by mouth daily. For depression   Indication:  Major Depressive Disorder       Follow-up Information   Follow up with Josph Macho May On 09/05/2013. (Monday, September 05, 2013 at [facility])    Contact information:   [place], [place]   [address]  [phone]      Follow up with Dr. Adele Schilder - [facility] On 09/21/2013. (Wednesday, September 21, 2013 at [facility].  Please arrive at 8:30 AM)    Contact information:   [place], [place]   [address]  [phone]    Follow-up recommendations:  Activity:  As tolerated Diet: As recommended by your primary care doctor. Keep all scheduled follow-up appointments as recommended.  Comments: Take all your medications as prescribed by your mental healthcare provider. Report any adverse effects and or reactions from your medicines to your outpatient provider promptly. Patient is instructed and cautioned to not engage in alcohol and or illegal drug use while on prescription medicines. In the event of worsening symptoms, patient is instructed to call the crisis hotline, 911 and or go to the nearest ED for appropriate evaluation and treatment of symptoms. Follow-up  with your primary care provider for your other medical issues, concerns and or health care needs.    Total Discharge Time:  Greater than 30 minutes.  Signed: Encarnacion Slates, [place], [place], [facility] 08/22/2013, 4:01 PM I personally assessed the patient and formulated the plan Geralyn Flash A. Sabra Heck, M.D.

## 2013-08-22 NOTE — Progress Notes (Signed)
Follow up with pt for support around discharge.  Nicole Hubbard hopeful to continue support with chaplain after discharge.  Chaplain described difficulty in supporting patients after discharge and spoke with pt about what she found helpful and encouraged pt to find support in her community.   Pt appreciative.     [place], [place]

## 2013-08-22 NOTE — Progress Notes (Signed)
[facility] Adult Case Management Discharge Plan :  Will you be returning to the same living situation after discharge: Yes,  Patient is returning to her home. At discharge, do you have transportation home?:Yes,  Patient is arranging transportation home. Do you have the ability to pay for your medications:Yes,  Patient is able to obtain medications.  Release of information consent forms completed and in the chart;  Patient's signature needed at discharge.  Patient to Follow up at: Follow-up Information   Follow up with Josph Macho May On 09/05/2013. (Monday, September 05, 2013 at [facility])    Contact information:   [place], [place]   [address]  [phone]      Follow up with Dr. Adele Schilder - [facility] On 09/21/2013. (Wednesday, September 21, 2013 at [facility].  Please arrive at 8:30 AM)    Contact information:   [address] [place], [place]   [address]  [phone]      Patient denies SI/HI:   Patient no longer endorsing SI/HI or other thoughts of self harm.   Safety Planning and Suicide Prevention discussed: .Reviewed with all patients during discharge planning group   Sakari Alkhatib, Eulas Post 08/22/2013, 2:20 PM
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
## 2013-08-22 NOTE — BHH Group Notes (Signed)
[facility] LCSW Group Therapy          Overcoming Obstacles       1:15 -2:30        08/22/2013       Type of Therapy:  Group Therapy  Participation Level:  Appropriate  Participation Quality:  Appropriate  Affect:  Appropriate, Alert  Cognitive:  Attentive Appropriate  Insight: Developing/Improving Engaged  Engagement in Therapy: Developing/Imprvoing Engaged  Modes of Intervention:  Discussion Exploration  Education Rapport BuildingProblem-Solving Support  Summary of Progress/Problems:  The main focus of today's group was overcoming obstacles.  She shared procrastination is the obstacle she has to overcome.  Patient able to identify appropriate coping skills.   Nicole Hubbard 08/22/2013

## 2013-08-22 NOTE — BHH Group Notes (Signed)
[facility] LCSW Aftercare Discharge Planning Group Note   08/22/2013 10:27 AM    Participation Quality:  Appropraite  Mood/Affect:  Appropriate  Depression Rating:  1  Anxiety Rating:  1  Thoughts of Suicide:  No  Will you contract for safety?   NA  Current AVH:  No  Plan for Discharge/Comments:  Patient attended discharge planning group and actively participated in group.  She reports doing well and being ready to discharge home today.  She will follow up with [facility].  CSW provided all participants with daily workbook.   Transportation Means: Patient has transportation.   Supports:  Patient has a support system.   Zhania Shaheen, Eulas Post

## 2013-08-22 NOTE — BHH Suicide Risk Assessment (Signed)
Suicide Risk Assessment  Discharge Assessment     Demographic Factors:  NA  Total Time spent with patient: 45 minutes  Psychiatric Specialty Exam:     Blood pressure 108/73, pulse 98, temperature 98.7 F (37.1 C), temperature source Oral, resp. rate 18, height 5' 9.5" (1.765 m), weight 146.965 kg (324 lb), SpO2 100.00%.Body mass index is 47.18 kg/(m^2).  General Appearance: Fairly Groomed  Engineer, water::  Fair  Speech:  Clear and Coherent  Volume:  Normal  Mood:  Anxious and worried  Affect:  worried  Thought Process:  Coherent and Goal Directed  Orientation:  Full (Time, Place, and Person)  Thought Content:  worries, concerns as she moves on  Suicidal Thoughts:  No  Homicidal Thoughts:  No  Memory:  Immediate;   Fair Recent;   Fair Remote;   Fair  Judgement:  Fair  Insight:  Present  Psychomotor Activity:  Restlessness  Concentration:  Fair  Recall:  [employer] of Knowledge:NA  Language: Fair  Akathisia:  No  Handed:    AIMS (if indicated):     Assets:  Desire for Improvement Housing Vocational/Educational  Sleep:  Number of Hours: 6.75    Musculoskeletal: Strength & Muscle Tone: within normal limits Gait & Station: normal Patient leans: N/A   Mental Status Per Nursing Assessment::   On Admission:  Self-harm thoughts  Current Mental Status by Physician: In full contact with reality. There are no active SI plans or intent. States she feels better. Wants to be sure she stays on her medications and has a therapist in place. 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 She is also hoping that her daughter goes back to school.    Loss Factors: NA  Historical Factors: NA  Risk Reduction Factors:   Sense of responsibility to family, Employed and Positive social support  Continued Clinical Symptoms:  Depression:   Severe  Cognitive Features That Contribute To Risk:   Closed-mindedness Polarized thinking Thought constriction (tunnel vision)    Suicide Risk:  Minimal: No identifiable suicidal ideation.  Patients presenting with no risk factors but with morbid ruminations; may be classified as minimal risk based on the severity of the depressive symptoms  Discharge Diagnoses:   AXIS I:  Major Depression recurrent severe without psychotic features AXIS II:  No diagnosis AXIS III:   Past Medical History  Diagnosis Date  . Depression   . Diabetes mellitus without complication   . Hypertension   . Chest pain   . Migraine    AXIS IV:  other psychosocial or environmental problems AXIS V:  61-70 mild symptoms  Plan Of Care/Follow-up recommendations:  Activity:  as tolerated Diet:  regular Follow up [facility] Is patient on multiple antipsychotic therapies at discharge:  No   Has Patient had three or more failed trials of antipsychotic monotherapy by history:  No  Recommended Plan for Multiple Antipsychotic Therapies: NA    Mashonda Broski A 08/22/2013, 1:11 PM

## 2013-08-22 NOTE — Progress Notes (Signed)
Patient ID: Nicole Hubbard, female   DOB: 26-Oct-1965, 48 y.o.   MRN: [id number] D)  Was sitting in the dayroom earlier this evening, stated she has had a good day, her mother and sister had been in for a visit, is thankful for them, and their support.   Making plans for after discharge and intends to include swimming as an activity to get herself out more and for her health.  Intends to stay on her meds and keep her appts.  Attended group this evening, compliant with meds, affect is brighter, pleasant.  Denies thoughts of self harm. A)  Will continue to offer support and encouragement, continue to monitor for safety, R)  Remains safe on unit.

## 2013-08-22 NOTE — Progress Notes (Signed)
D:  Patient's self inventory sheet, patient sleeps good, no sleep medication needed.  Has good appetite, normal energy level, good concentration.  Rated depression 1, denied hopeless and anxiety.  Denied withdrawals.  Denied cravings.  Denied SI.  Denied physical problems.  Plans to return home, no negativity.  Plans to stay positive.  Does have discharge plans, no problems taking medications after discharge. A:  Medications administered per MD orders.  Emotional support and encouragement. R:  Denied SI and HI.  Denied A/V hallucinations.  Safety maintained with 15 minute checks.

## 2013-08-23 ENCOUNTER — Ambulatory Visit (INDEPENDENT_AMBULATORY_CARE_PROVIDER_SITE_OTHER): Payer: 59 | Admitting: Family Medicine

## 2013-08-23 VITALS — BP 122/92 | HR 95 | Temp 98.7°F | Resp 18 | Ht 68.0 in | Wt 313.0 lb

## 2013-08-23 DIAGNOSIS — F329 Major depressive disorder, single episode, unspecified: Secondary | ICD-10-CM

## 2013-08-23 DIAGNOSIS — F3289 Other specified depressive episodes: Secondary | ICD-10-CM

## 2013-08-23 DIAGNOSIS — F32A Depression, unspecified: Secondary | ICD-10-CM

## 2013-08-23 NOTE — Progress Notes (Signed)
[facility] [address] [place] [address] [phone]  Date:  08/23/2013   Name:  Nicole Hubbard   DOB:  [birth year]   MRN:  [id number]  PCP:  Lamar Blinks, MD    Chief Complaint: Follow-up   History of Present Illness:  Nicole Hubbard is a 48 y.o. very pleasant female patient who presents with the following:  She is here today for a follow-up appt.  She was hospitalized at [facility] from 8/5 through 8/10.  She is now feeling better, but needs to figure out what to do with her job.  She would like to take a leave from her job while she continues to recover.    Brooke Dare will call me from her job to discuss her disability. Odette gives me permission to speak with her.   She is planning to attend IOP Her sister and her mother are providing strong support for her.  She is also being more open with her daughter [place], and is living with her mom  She would like to take leave from 8/13 to 12/20; she is able to get this time through short term disability   Patient Active Problem List   Diagnosis Date Noted  . MDD (major depressive disorder), recurrent severe, without psychosis 08/17/2013  . Morbid obesity 06/30/2012  . Depression 01/26/2012  . Diabetes mellitus, type 2 01/26/2012  . HTN (hypertension) 01/26/2012    Past Medical History  Diagnosis Date  . Depression   . Diabetes mellitus without complication   . Hypertension   . Chest pain   . Migraine     Past Surgical History  Procedure Laterality Date  . Knee surgery    . Tonsillectomy      History  Substance Use Topics  . Smoking status: Former Smoker    Types: Cigarettes    Quit date: 09/15/2008  . Smokeless tobacco: Never Used  . Alcohol Use: No    Family History  Problem Relation Age of Onset  . Cancer Father     urithrial    No Known Allergies  Medication list has been reviewed and updated.  Current Outpatient Prescriptions on File Prior to Visit  Medication Sig Dispense  Refill  . amLODipine (NORVASC) 10 MG tablet Take 1 tablet (10 mg total) by mouth daily. For high blood pressure      . citalopram (CELEXA) 20 MG tablet Take 1 tablet (20 mg total) by mouth daily. For depression  30 tablet  0  . hydrochlorothiazide (HYDRODIURIL) 25 MG tablet Take 1 tablet (25 mg total) by mouth daily. For high blood pressure  30 tablet  0  . losartan (COZAAR) 100 MG tablet Take 1 tablet (100 mg total) by mouth daily. For high blood pressure  30 tablet  0  . meclizine (ANTIVERT) 25 MG tablet Take 1 tablet (25 mg total) by mouth 3 (three) times daily as needed for dizziness.  30 tablet  0  . metFORMIN (GLUCOPHAGE) 500 MG tablet Take 1 tablet (500 mg total) by mouth 2 (two) times daily with a meal. For diabetes management  60 tablet  0  . traZODone (DESYREL) 50 MG tablet Take 1 tablet (50 mg total) by mouth at bedtime as needed for sleep (Insomnia).  30 tablet  0  . venlafaxine XR (EFFEXOR-XR) 37.5 MG 24 hr capsule Take 1 capsule (37.5 mg total) by mouth daily. For depression  30 capsule  0   No current facility-administered medications on file  prior to visit.    Review of Systems:  As per HPI- otherwise negative.   Physical Examination: Filed Vitals:   08/23/13 0925  BP: 122/92  Pulse: 95  Temp: 98.7 F (37.1 C)  Resp: 18   Filed Vitals:   08/23/13 0925  Height: 5\' 8"  (1.727 m)  Weight: 313 lb (141.976 kg)   Body mass index is 47.6 kg/(m^2). Ideal Body Weight: Weight in (lb) to have BMI = 25: 164.1  GEN: WDWN, NAD, Non-toxic, A & O x 3, obese, looks well.  Not tearful today HEENT: Atraumatic, Normocephalic. Neck supple. No masses, No LAD. EXTR: No c/c/e NEURO Normal gait.  PSYCH: Normally interactive. Conversant. Not depressed or anxious appearing.  Calm demeanor.    Assessment and Plan: Depression  Discharged from [facility] yesterday, denies any SI.  She has hope that things will get better for her and plans to attend IOP for further therapy.  Her BP also looks  fine with her current regimen.  She will be in touch with me regarding disability paperwork which I am glad to do for her  Signed Lamar Blinks, MD

## 2013-08-23 NOTE — Patient Instructions (Addendum)
Great to see you today!  I will be glad to do your disability paperwork for you- we may need to meet face to face but I may be able to do the papers over the phone.    Your recent A1c was high-  Keep working on your diet and exercise, and we can follow up in about 2 months. Check your sugar about twice a week and write it down.

## 2013-08-24 NOTE — Progress Notes (Signed)
Patient Discharge Instructions:  After Visit Summary (AVS):   Faxed to:  08/24/13 Discharge Summary Note:   Faxed to:  08/24/13 Psychiatric Admission Assessment Note:   Faxed to:  08/24/13 Suicide Risk Assessment - Discharge Assessment:   Faxed to:  08/24/13 Faxed/Sent to the Next Level Care provider:  08/24/13 Next Level Care Provider Has Access to the EMR, 08/24/13 Faxed to [place], [place] @ [phone] Records provided to [facility] via [facility]/Epic access.  [name], 08/24/2013, 1:47 PM

## 2013-09-12 ENCOUNTER — Encounter: Payer: 59 | Admitting: Family Medicine

## 2013-09-21 ENCOUNTER — Ambulatory Visit (INDEPENDENT_AMBULATORY_CARE_PROVIDER_SITE_OTHER): Payer: PRIVATE HEALTH INSURANCE | Admitting: Psychiatry

## 2013-09-21 ENCOUNTER — Encounter (HOSPITAL_COMMUNITY): Payer: Self-pay | Admitting: Psychiatry

## 2013-09-21 ENCOUNTER — Encounter (INDEPENDENT_AMBULATORY_CARE_PROVIDER_SITE_OTHER): Payer: Self-pay

## 2013-09-21 VITALS — BP 126/92 | HR 85 | Ht 68.5 in | Wt 314.2 lb

## 2013-09-21 DIAGNOSIS — F339 Major depressive disorder, recurrent, unspecified: Secondary | ICD-10-CM | POA: Diagnosis not present

## 2013-09-21 DIAGNOSIS — F331 Major depressive disorder, recurrent, moderate: Secondary | ICD-10-CM

## 2013-09-21 MED ORDER — CHLORPROMAZINE HCL 25 MG PO TABS: 25.0000 mg | ORAL_TABLET | Freq: Every day | ORAL | Status: DC

## 2013-09-21 MED ORDER — CITALOPRAM HYDROBROMIDE 40 MG PO TABS: 40.0000 mg | ORAL_TABLET | Freq: Every day | ORAL | Status: DC
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
## 2013-09-29 ENCOUNTER — Other Ambulatory Visit (HOSPITAL_COMMUNITY): Payer: PRIVATE HEALTH INSURANCE | Attending: Psychiatry | Admitting: Psychiatry

## 2013-09-29 ENCOUNTER — Encounter (HOSPITAL_COMMUNITY): Payer: Self-pay

## 2013-09-29 DIAGNOSIS — I10 Essential (primary) hypertension: Secondary | ICD-10-CM | POA: Insufficient documentation

## 2013-09-29 DIAGNOSIS — IMO0002 Reserved for concepts with insufficient information to code with codable children: Secondary | ICD-10-CM | POA: Insufficient documentation

## 2013-09-29 DIAGNOSIS — Z609 Problem related to social environment, unspecified: Secondary | ICD-10-CM | POA: Diagnosis not present

## 2013-09-29 DIAGNOSIS — Z87891 Personal history of nicotine dependence: Secondary | ICD-10-CM | POA: Diagnosis not present

## 2013-09-29 DIAGNOSIS — F332 Major depressive disorder, recurrent severe without psychotic features: Secondary | ICD-10-CM | POA: Diagnosis not present

## 2013-09-29 DIAGNOSIS — R45851 Suicidal ideations: Secondary | ICD-10-CM | POA: Insufficient documentation

## 2013-09-29 DIAGNOSIS — E119 Type 2 diabetes mellitus without complications: Secondary | ICD-10-CM | POA: Diagnosis not present

## 2013-09-29 DIAGNOSIS — F411 Generalized anxiety disorder: Secondary | ICD-10-CM | POA: Insufficient documentation

## 2013-09-29 DIAGNOSIS — F4310 Post-traumatic stress disorder, unspecified: Secondary | ICD-10-CM

## 2013-09-29 DIAGNOSIS — G4700 Insomnia, unspecified: Secondary | ICD-10-CM | POA: Diagnosis not present

## 2013-09-29 DIAGNOSIS — F331 Major depressive disorder, recurrent, moderate: Secondary | ICD-10-CM

## 2013-09-29 NOTE — Progress Notes (Signed)
    Daily Group Progress Note  Program: IOP  Group Time: 9:00-10:30  Participation Level: Active  Behavioral Response: Appropriate  Type of Therapy:  Group Therapy  Summary of Progress: Pt. Met with psychiatrist and case manager.      Group Time: 10:30-12:00  Participation Level:  Active  Behavioral Response: Appropriate  Type of Therapy: Psycho-education Group  Summary of Progress: Pt. Met with psychiatrist and case manager.   Nancie Neas, COUNS
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
## 2013-09-30 ENCOUNTER — Other Ambulatory Visit (HOSPITAL_COMMUNITY): Payer: PRIVATE HEALTH INSURANCE | Admitting: Psychiatry

## 2013-09-30 DIAGNOSIS — F331 Major depressive disorder, recurrent, moderate: Secondary | ICD-10-CM

## 2013-09-30 DIAGNOSIS — F332 Major depressive disorder, recurrent severe without psychotic features: Secondary | ICD-10-CM | POA: Diagnosis not present

## 2013-09-30 DIAGNOSIS — F4310 Post-traumatic stress disorder, unspecified: Secondary | ICD-10-CM

## 2013-09-30 NOTE — Progress Notes (Signed)
    Daily Group Progress Note  Program: IOP  Group Time: 9:00-10:30  Participation Level: Active  Behavioral Response: Appropriate  Type of Therapy:  Group Therapy  Summary of Progress: Pt reported that she was doing "just ok". Pt. 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      Group Time: 10:30-12:00  Participation Level:  Active  Behavioral Response: Appropriate  Type of Therapy: Psycho-education Group  Summary of Progress: Pt. Participated in discussion about developing healthy boundaries.   Nancie Neas, COUNS

## 2013-10-03 ENCOUNTER — Other Ambulatory Visit (HOSPITAL_COMMUNITY): Payer: PRIVATE HEALTH INSURANCE | Admitting: Psychiatry

## 2013-10-03 DIAGNOSIS — F332 Major depressive disorder, recurrent severe without psychotic features: Secondary | ICD-10-CM | POA: Diagnosis not present

## 2013-10-03 NOTE — Progress Notes (Signed)
    Daily Group Progress Note  Program: IOP  Group Time: 7076-1518  Participation Level: Active  Behavioral Response: Appropriate and Sharing  Type of Therapy:  Process Group  Summary of Progress:   At first pt was quiet, but started opening up whenever a peer started sharing about a past suicide attempt.  Pt admits to struggle with SI on a daily basis.  States that is why she sleeps so much.  "When I sleep, I don't think about it as much."  States that her daughter is her deterrent.  Pt shared her story at length.     Group Time: 1045-1200  Participation Level:  Active  Behavioral Response: Appropriate, Sharing and Motivated  Type of Therapy: Psycho-education Group  Summary of Progress: Discharge Planning: Focused on setting treatment goals for MH-IOP, assessing current resources; while exploring new resources, making a plan for keeping yourself safe while in IOP/Discharge and arranging an individual aftercare plan.

## 2013-10-04 ENCOUNTER — Other Ambulatory Visit (HOSPITAL_COMMUNITY): Payer: PRIVATE HEALTH INSURANCE | Admitting: Psychiatry

## 2013-10-04 DIAGNOSIS — F332 Major depressive disorder, recurrent severe without psychotic features: Secondary | ICD-10-CM | POA: Diagnosis not present

## 2013-10-04 NOTE — Progress Notes (Signed)
    Daily Group Progress Note  Program: IOP  Group Time: 9:00-10:30  Participation Level: Active  Behavioral Response: Appropriate  Type of Therapy:  Group Therapy  Summary of Progress: Pt. Reported that she was not doing so good. Pt. Reported that she takes 2 medications for sleep is able to fall asleep but cannot stay asleep. Pt. Reports that she has been feeling very lethargic and sleepy, and low motivation to do the things that she needs to do.      Group Time: 10:30-12:00  Participation Level:  Active  Behavioral Response: Appropriate  Type of Therapy: Psycho-education Group  Summary of Progress: Pt. Participated in reflective reading about developing acceptance for anxiety.  Nancie Neas, COUNS
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
## 2013-10-04 NOTE — Progress Notes (Signed)
Patient ID: Nicole Hubbard, female   DOB: 1965/04/23, 48 y.o.   MRN: [id number] Patient seen today, states that she has started doing the sleep hygiene and is able to fall asleep but then wakes up in the middle of the night and is up for 4 hours, this makes her feel very tired and sleepy during the day. Discussed rationale risks benefits of Ambien 10 mg for her middle insomnia and patient gave her informed consent she'll start this medicine tonight. Patient continues to be anxious mood is dysphoric appetite is poor denies suicidal or homicidal ideation and has no hallucinations or delusions. Patient was complimented on her efforts spend regards to the sleep hygiene. Will be seen in 2 days.

## 2013-10-05 ENCOUNTER — Other Ambulatory Visit (HOSPITAL_COMMUNITY): Payer: PRIVATE HEALTH INSURANCE | Admitting: Psychiatry

## 2013-10-05 ENCOUNTER — Telehealth (HOSPITAL_COMMUNITY): Payer: Self-pay | Admitting: Psychiatry

## 2013-10-06 ENCOUNTER — Other Ambulatory Visit (HOSPITAL_COMMUNITY): Payer: PRIVATE HEALTH INSURANCE | Admitting: Psychiatry

## 2013-10-06 ENCOUNTER — Encounter (HOSPITAL_COMMUNITY): Payer: Self-pay | Admitting: Psychiatry

## 2013-10-06 VITALS — BP 110/80

## 2013-10-06 DIAGNOSIS — F4310 Post-traumatic stress disorder, unspecified: Secondary | ICD-10-CM

## 2013-10-06 DIAGNOSIS — F331 Major depressive disorder, recurrent, moderate: Secondary | ICD-10-CM

## 2013-10-06 DIAGNOSIS — F332 Major depressive disorder, recurrent severe without psychotic features: Secondary | ICD-10-CM | POA: Diagnosis not present

## 2013-10-06 MED ORDER — METFORMIN HCL 500 MG PO TABS: 500.0000 mg | ORAL_TABLET | Freq: Two times a day (BID) | ORAL | Status: DC

## 2013-10-06 MED ORDER — HYDROCHLOROTHIAZIDE 25 MG PO TABS: 25.0000 mg | ORAL_TABLET | Freq: Every day | ORAL | Status: DC

## 2013-10-06 MED ORDER — LOSARTAN POTASSIUM 100 MG PO TABS: 100.0000 mg | ORAL_TABLET | Freq: Every day | ORAL | Status: DC

## 2013-10-06 NOTE — Progress Notes (Signed)
    Daily Group Progress Note  Program: IOP  Group Time: 9:00-10:30  Participation Level: Active  Behavioral Response: Appropriate  Type of Therapy:  Group Therapy  Summary of Progress: Pt. Reports that she continues to try to regulate sleep routine. Pt. Reports that she slept approximately 7 hours last night but feels that she is taking too much sleep medication. Pt. Processed ongoing conflict and needs for validation from her mother.     Group Time: 10:30-12:00  Participation Level:  Active  Behavioral Response: Appropriate  Type of Therapy: Psycho-education Group  Summary of Progress: Pt. Participated in guided reflection and guided visualization "Putting the backpack down".  Nancie Neas, COUNS

## 2013-10-06 NOTE — Progress Notes (Signed)
Patient ID: Nicole Hubbard, female   DOB: 11/24/1965, 48 y.o.   MRN: [id number] Patient seen with case manager Dellia Nims, states she took Ambien and had her first full nights of sleep. Patient feels very relaxed and calm. States that she hopes this sleeping pattern continuous. Denies suicidal or homicidal ideation no hallucinations or delusions but continue all her medications.

## 2013-10-07 ENCOUNTER — Other Ambulatory Visit (HOSPITAL_COMMUNITY): Payer: PRIVATE HEALTH INSURANCE | Admitting: Psychiatry

## 2013-10-07 DIAGNOSIS — F332 Major depressive disorder, recurrent severe without psychotic features: Secondary | ICD-10-CM | POA: Diagnosis not present

## 2013-10-07 DIAGNOSIS — F331 Major depressive disorder, recurrent, moderate: Secondary | ICD-10-CM

## 2013-10-07 NOTE — Progress Notes (Signed)
    Daily Group Progress Note  Program: IOP  Group Time: 9:00-10:30  Participation Level: Active  Behavioral Response: Appropriate  Type of Therapy:  Group Therapy  Summary of Progress: Pt. Presented with flat affect. Pt. Reported that she felt upbeat yesterday and down again today. Pt.'s mother is trigger for her depression and Pt. Is continuing to work on setting healthier boundaries with her mother.      Group Time: 10:30-12:00  Participation Level:  Active  Behavioral Response: Appropriate  Type of Therapy: Psycho-education Group  Summary of Progress: Pt. Participated in discussion about developing self-compassion.   Nancie Neas, COUNS

## 2013-10-10 ENCOUNTER — Other Ambulatory Visit (HOSPITAL_COMMUNITY): Payer: PRIVATE HEALTH INSURANCE | Admitting: Psychiatry

## 2013-10-10 DIAGNOSIS — F331 Major depressive disorder, recurrent, moderate: Secondary | ICD-10-CM

## 2013-10-10 DIAGNOSIS — F4310 Post-traumatic stress disorder, unspecified: Secondary | ICD-10-CM

## 2013-10-10 DIAGNOSIS — F332 Major depressive disorder, recurrent severe without psychotic features: Secondary | ICD-10-CM | POA: Diagnosis not present

## 2013-10-10 NOTE — Progress Notes (Signed)
    Daily Group Progress Note  Program: IOP  Group Time: 9:00-10:30  Participation Level: Active  Behavioral Response: Appropriate  Type of Therapy:  Group Therapy  Summary of Progress: Pt. Reported feeling "just here". Pt. Continues to present with flat affect, occasionally tearful. Pt. Working on themes related to setting healthy boundaries with mother and friends.      Group Time: 10:30-12:00  Participation Level:  Active  Behavioral Response: Appropriate  Type of Therapy: Psycho-education Group  Summary of Progress: Pt. Participated in grief and loss facilitated by Jeanella Craze.   Nancie Neas, COUNS

## 2013-10-11 ENCOUNTER — Other Ambulatory Visit (HOSPITAL_COMMUNITY): Payer: PRIVATE HEALTH INSURANCE | Admitting: Psychiatry

## 2013-10-11 DIAGNOSIS — F332 Major depressive disorder, recurrent severe without psychotic features: Secondary | ICD-10-CM | POA: Diagnosis not present

## 2013-10-11 DIAGNOSIS — F331 Major depressive disorder, recurrent, moderate: Secondary | ICD-10-CM

## 2013-10-11 NOTE — Progress Notes (Signed)
    Daily Group Progress Note  Program: IOP  Group Time: 9:00-10:30  Participation Level: Active  Behavioral Response: Appropriate  Type of Therapy:  Group Therapy  Summary of Progress: Pt. Reported that she felt emotionally "full". Pt. Continues to be challenged by setting new boundaries in family and friend relationships.      Group Time: 10:30-12:00  Participation Level:  Active  Behavioral Response: Appropriate  Type of Therapy: Psycho-education Group  Summary of Progress: Pt. Participated in discussion about developing self-compassion. Pt. Watched and discussed Marilynn Latino video.  Nancie Neas, COUNS

## 2013-10-12 ENCOUNTER — Other Ambulatory Visit (HOSPITAL_COMMUNITY): Payer: PRIVATE HEALTH INSURANCE

## 2013-10-12 ENCOUNTER — Telehealth: Payer: Self-pay

## 2013-10-12 ENCOUNTER — Other Ambulatory Visit (HOSPITAL_COMMUNITY): Payer: 59 | Attending: Psychiatry | Admitting: Psychiatry

## 2013-10-12 DIAGNOSIS — F332 Major depressive disorder, recurrent severe without psychotic features: Secondary | ICD-10-CM | POA: Diagnosis not present

## 2013-10-12 NOTE — Telephone Encounter (Signed)
Clld pt - Confirmed 10/31/2013 Diabetic Care follow up appt.

## 2013-10-12 NOTE — Progress Notes (Signed)
    Daily Group Progress Note  Program: IOP  Group Time: 9:00-10:30  Participation Level: Active  Behavioral Response: Appropriate  Type of Therapy:  Group Therapy  Summary of Progress: Pt. Continues to present with primarily flat affect, participates in group, receives and gives appropriate feedback. Pt. Continues to develop healthy relationship boundaries and coping skills for work stress.      Group Time: 10:30-12:00  Participation Level:  Active  Behavioral Response: Appropriate  Type of Therapy: Psycho-education Group  Summary of Progress: Pt. Participated in discussion about developing vulnerability. Pt. Watched and discussed Almond Lint video.  Nancie Neas, COUNS

## 2013-10-13 ENCOUNTER — Other Ambulatory Visit (HOSPITAL_COMMUNITY): Payer: PRIVATE HEALTH INSURANCE | Attending: Psychiatry | Admitting: Psychiatry

## 2013-10-13 DIAGNOSIS — Z599 Problem related to housing and economic circumstances, unspecified: Secondary | ICD-10-CM | POA: Diagnosis not present

## 2013-10-13 DIAGNOSIS — G4700 Insomnia, unspecified: Secondary | ICD-10-CM | POA: Insufficient documentation

## 2013-10-13 DIAGNOSIS — F332 Major depressive disorder, recurrent severe without psychotic features: Secondary | ICD-10-CM | POA: Insufficient documentation

## 2013-10-13 DIAGNOSIS — Z87891 Personal history of nicotine dependence: Secondary | ICD-10-CM | POA: Insufficient documentation

## 2013-10-13 DIAGNOSIS — Z609 Problem related to social environment, unspecified: Secondary | ICD-10-CM | POA: Diagnosis not present

## 2013-10-13 DIAGNOSIS — E119 Type 2 diabetes mellitus without complications: Secondary | ICD-10-CM | POA: Diagnosis not present

## 2013-10-13 DIAGNOSIS — Z62810 Personal history of physical and sexual abuse in childhood: Secondary | ICD-10-CM | POA: Diagnosis not present

## 2013-10-13 DIAGNOSIS — I10 Essential (primary) hypertension: Secondary | ICD-10-CM | POA: Insufficient documentation

## 2013-10-13 DIAGNOSIS — F331 Major depressive disorder, recurrent, moderate: Secondary | ICD-10-CM

## 2013-10-13 DIAGNOSIS — F411 Generalized anxiety disorder: Secondary | ICD-10-CM | POA: Insufficient documentation

## 2013-10-13 NOTE — Progress Notes (Signed)
    Daily Group Progress Note  Program: IOP  Group Time: 9:00-10:30  Participation Level: Active  Behavioral Response: Appropriate  Type of Therapy:  Group Therapy  Summary of Progress: Pt. Continues to present with flat affect. Pt. Concerned about needing to sleep with medication and waking up feeling sedated. Pt. Reported that she she went for a walk yesterday evening and is continuing to cope with developing new relationship boundaries.      Group Time: 10:30-12:00  Participation Level:  Active  Behavioral Response: Appropriate  Type of Therapy: Psycho-education Group  Summary of Progress: Pt. Participated in discussion about developing self-care and leisure activities.   Nancie Neas, COUNS

## 2013-10-14 ENCOUNTER — Other Ambulatory Visit (HOSPITAL_COMMUNITY): Payer: PRIVATE HEALTH INSURANCE | Admitting: Psychiatry

## 2013-10-14 DIAGNOSIS — F331 Major depressive disorder, recurrent, moderate: Secondary | ICD-10-CM

## 2013-10-14 DIAGNOSIS — F332 Major depressive disorder, recurrent severe without psychotic features: Secondary | ICD-10-CM | POA: Diagnosis not present
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
## 2013-10-17 ENCOUNTER — Ambulatory Visit (HOSPITAL_COMMUNITY): Payer: Self-pay | Admitting: Psychiatry

## 2013-10-17 ENCOUNTER — Other Ambulatory Visit (HOSPITAL_COMMUNITY): Payer: PRIVATE HEALTH INSURANCE | Admitting: Psychiatry

## 2013-10-17 DIAGNOSIS — F332 Major depressive disorder, recurrent severe without psychotic features: Secondary | ICD-10-CM | POA: Diagnosis not present

## 2013-10-17 NOTE — Progress Notes (Signed)
Patient ID: Nicole Hubbard, female   DOB: 1965/10/22, 48 y.o.   MRN: [id number]  patient seen today, states she is doing better although reports that she feels sleepy during the day. Discussed the effect of weather and seasonal affective disorder with her. Patient is able to sleep all night, encouraged patient to walk and she stated understanding. Patient states that when she is home home she sleeps. Encourage patient to do various activities which include volunteering walking reading and watching, the movies she stated understanding. Denies suicidal or homicidal ideation appetite is good mood has improved significantly she is tolerating her medications well has no hallucinations or delusions.

## 2013-10-18 ENCOUNTER — Other Ambulatory Visit (HOSPITAL_COMMUNITY): Payer: PRIVATE HEALTH INSURANCE | Admitting: Psychiatry

## 2013-10-18 DIAGNOSIS — F332 Major depressive disorder, recurrent severe without psychotic features: Secondary | ICD-10-CM | POA: Diagnosis not present

## 2013-10-18 DIAGNOSIS — F4310 Post-traumatic stress disorder, unspecified: Secondary | ICD-10-CM

## 2013-10-18 DIAGNOSIS — F331 Major depressive disorder, recurrent, moderate: Secondary | ICD-10-CM

## 2013-10-18 NOTE — Progress Notes (Signed)
    Daily Group Progress Note  Program: IOP  Group Time: 9:00-10:30  Participation Level: Active  Behavioral Response: Appropriate  Type of Therapy:  Group Therapy  Summary of Progress: Pt. Was talkative and alert. Pt. Reported that she had a good weekend, she was doing "ok", concerned about developing routine after discharge on Thursday and being able to return to work.      Group Time: 10:30-12:00  Participation Level:  Active  Behavioral Response: Appropriate  Type of Therapy: Psycho-education Group  Summary of Progress: Pt. Participated in grief and loss group.   Nancie Neas, COUNS

## 2013-10-18 NOTE — Patient Instructions (Signed)
Patient completed MH-IOP today.  Will follow up with Dr. Adele Schilder on 10-19-13 @ 11 a.m and Fred May, [facility] on 10-31-13 @ 9 a.m.  Encouraged support groups.  Dr. Adele Schilder will determine if extension is needed for work.

## 2013-10-18 NOTE — Progress Notes (Signed)
Nicole Hubbard is a 48 y.o., African American, divorced, employed female, who was referred by Dr. Adele Schilder, due to depressive and anxiety symptoms with passive SI. Pt denied a plan or intent. Reported deterrent is her daughter. Two previous suicide attempts when she was younger (age 65: Almost jumped off bridge in [place] and at age 72 overdosed. Denied HI or A/V hallucinations. Other symptoms included: Crying spells, decreased appetite (has lost 20 lbs within two months), anhedonia, poor sleep (4 hrs), low energy, poor concentration, irritability, isolative, sadness, feelings of hopelessness, helplessness and worthlessness. Pt reported she's been experiencing depression for more than ten years; however she has been decompensating seven months ago when she stopped taking her diabetes medication. "I was doing reckless things like drinking and driving and speeding." Has been seeing Fred May, [facility] since August 2015 and saw Dr. Adele Schilder a couple of weeks ago. Patient was discharged from behavioral [place] on August 10. She was admitted on August 5 because of severe depression and having suicidal thoughts. Stressors: 1) Job ([employer]) of ten years. 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 Pt has been written up due to exchanging words with a coworker while on the job. 2) Strained relationship with mother. "My mother is very needy. She only calls me whenever she needs something. I just give, give, give and get nothing." 40) 11 year old Daughter: She stopped going to school ~ a yr ago at [employer] due to Gastro-intestinal illness. "I worry about her. She is currently working, but I want her to go back to finish school." 4) Unresolved grief/loss issues: Father died in 05-13-02 due to cancer. Pt stated they were working on mending their relationship.  Pt ended MH-IOP a few days earlier, due to wanting to see Dr. Adele Schilder before her extension from work ended.  Overall mood is improving.   States she is functioning better.  Continues to struggle with poor appetite and sleep.  Reports that sleep fluctuates and she is only eating one meal a day.  Continues to admit to SI, no plan or intent.  Able to contract for safety.  Deterrent is daughter, who lives with her.  Denies HI and A/V hallucinations.  Pt reports it was very helpful to be around other people and talk about issues.  A:  D/C today.  F/U with Dr. Adele Schilder on 10-19-13 @ 11 a.m and Fred May, [facility] on 10-31-13 @ 9 a.m.  Encouraged support groups.  Dr. Adele Schilder will determined RTW date.  R:  Pt receptive.

## 2013-10-19 ENCOUNTER — Encounter (HOSPITAL_COMMUNITY): Payer: Self-pay | Admitting: Psychiatry

## 2013-10-19 ENCOUNTER — Ambulatory Visit (INDEPENDENT_AMBULATORY_CARE_PROVIDER_SITE_OTHER): Payer: 59 | Admitting: Psychiatry

## 2013-10-19 ENCOUNTER — Other Ambulatory Visit (HOSPITAL_COMMUNITY): Payer: PRIVATE HEALTH INSURANCE

## 2013-10-19 VITALS — BP 122/75 | HR 98 | Wt 311.0 lb

## 2013-10-19 DIAGNOSIS — F1019 Alcohol abuse with unspecified alcohol-induced disorder: Secondary | ICD-10-CM

## 2013-10-19 DIAGNOSIS — F4310 Post-traumatic stress disorder, unspecified: Secondary | ICD-10-CM

## 2013-10-19 DIAGNOSIS — F331 Major depressive disorder, recurrent, moderate: Secondary | ICD-10-CM

## 2013-10-19 MED ORDER — ZOLPIDEM TARTRATE 10 MG PO TABS: 10.0000 mg | ORAL_TABLET | Freq: Every day | ORAL | Status: DC

## 2013-10-19 MED ORDER — CITALOPRAM HYDROBROMIDE 40 MG PO TABS: 40.0000 mg | ORAL_TABLET | Freq: Every day | ORAL | Status: DC

## 2013-10-19 MED ORDER — CHLORPROMAZINE HCL 25 MG PO TABS: 25.0000 mg | ORAL_TABLET | Freq: Every day | ORAL | Status: DC

## 2013-10-19 NOTE — Progress Notes (Signed)
    Daily Group Progress Note  Program: IOP  Group Time: 9:00-10:30  Participation Level: Active  Behavioral Response: Appropriate  Type of Therapy:  Group Therapy  Summary of Progress: Pt. Reported that she felt really sad and was concerned about going to work. Pt prepared for discharge. Pt. Shared progress that she has made in creating healthy boundaries with family members and friends.      Group Time: 10:30-12:00  Participation Level:  Active  Behavioral Response: Appropriate  Type of Therapy: Psycho-education Group  Summary of Progress: Pt. Participated in discussion about developing radical acceptance of herself.   Nancie Neas, COUNS
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
## 2013-10-20 ENCOUNTER — Other Ambulatory Visit (HOSPITAL_COMMUNITY): Payer: PRIVATE HEALTH INSURANCE

## 2013-10-21 ENCOUNTER — Other Ambulatory Visit (HOSPITAL_COMMUNITY): Payer: PRIVATE HEALTH INSURANCE

## 2013-10-24 ENCOUNTER — Other Ambulatory Visit (HOSPITAL_COMMUNITY): Payer: PRIVATE HEALTH INSURANCE

## 2013-10-25 ENCOUNTER — Other Ambulatory Visit (HOSPITAL_COMMUNITY): Payer: PRIVATE HEALTH INSURANCE

## 2013-10-26 ENCOUNTER — Other Ambulatory Visit (HOSPITAL_COMMUNITY): Payer: PRIVATE HEALTH INSURANCE

## 2013-10-27 ENCOUNTER — Ambulatory Visit (HOSPITAL_COMMUNITY): Payer: Self-pay | Admitting: Psychiatry

## 2013-10-27 ENCOUNTER — Other Ambulatory Visit (HOSPITAL_COMMUNITY): Payer: PRIVATE HEALTH INSURANCE

## 2013-10-28 ENCOUNTER — Other Ambulatory Visit (HOSPITAL_COMMUNITY): Payer: PRIVATE HEALTH INSURANCE

## 2013-10-31 ENCOUNTER — Other Ambulatory Visit (HOSPITAL_COMMUNITY): Payer: PRIVATE HEALTH INSURANCE

## 2013-10-31 ENCOUNTER — Ambulatory Visit (HOSPITAL_COMMUNITY): Payer: Self-pay | Admitting: Psychiatry

## 2013-10-31 ENCOUNTER — Encounter: Payer: Self-pay | Admitting: Family Medicine

## 2013-10-31 ENCOUNTER — Ambulatory Visit (INDEPENDENT_AMBULATORY_CARE_PROVIDER_SITE_OTHER): Payer: 59 | Admitting: Family Medicine

## 2013-10-31 ENCOUNTER — Other Ambulatory Visit (HOSPITAL_COMMUNITY): Payer: Self-pay | Admitting: Psychiatry

## 2013-10-31 VITALS — BP 127/90 | HR 92 | Temp 98.1°F | Resp 18 | Ht 68.75 in | Wt 310.8 lb

## 2013-10-31 DIAGNOSIS — Z124 Encounter for screening for malignant neoplasm of cervix: Secondary | ICD-10-CM

## 2013-10-31 DIAGNOSIS — G43109 Migraine with aura, not intractable, without status migrainosus: Secondary | ICD-10-CM

## 2013-10-31 DIAGNOSIS — E1169 Type 2 diabetes mellitus with other specified complication: Secondary | ICD-10-CM

## 2013-10-31 DIAGNOSIS — Z23 Encounter for immunization: Secondary | ICD-10-CM

## 2013-10-31 DIAGNOSIS — F331 Major depressive disorder, recurrent, moderate: Secondary | ICD-10-CM

## 2013-10-31 DIAGNOSIS — E669 Obesity, unspecified: Secondary | ICD-10-CM

## 2013-10-31 DIAGNOSIS — I10 Essential (primary) hypertension: Secondary | ICD-10-CM

## 2013-10-31 DIAGNOSIS — E785 Hyperlipidemia, unspecified: Secondary | ICD-10-CM

## 2013-10-31 DIAGNOSIS — Z113 Encounter for screening for infections with a predominantly sexual mode of transmission: Secondary | ICD-10-CM

## 2013-10-31 DIAGNOSIS — E119 Type 2 diabetes mellitus without complications: Secondary | ICD-10-CM

## 2013-10-31 DIAGNOSIS — N950 Postmenopausal bleeding: Secondary | ICD-10-CM

## 2013-10-31 LAB — BASIC METABOLIC PANEL
BUN: 9 mg/dL (ref 6–23)
CO2: 25 mEq/L (ref 19–32)
Calcium: 9.6 mg/dL (ref 8.4–10.5)
Chloride: 103 mEq/L (ref 96–112)
Creat: 0.78 mg/dL (ref 0.50–1.10)
Glucose, Bld: 117 mg/dL — ABNORMAL HIGH (ref 70–99)
Potassium: 4 mEq/L (ref 3.5–5.3)
Sodium: 140 mEq/L (ref 135–145)

## 2013-10-31 LAB — HEMOGLOBIN A1C
HEMOGLOBIN A1C: 7.3 % — AB (ref <5.7)
Mean Plasma Glucose: 163 mg/dL — ABNORMAL HIGH (ref <117)

## 2013-10-31 MED ORDER — PRAVASTATIN SODIUM 40 MG PO TABS: 40.0000 mg | ORAL_TABLET | Freq: Every day | ORAL | Status: DC

## 2013-10-31 MED ORDER — LOSARTAN POTASSIUM 100 MG PO TABS: 100.0000 mg | ORAL_TABLET | Freq: Every day | ORAL | Status: DC

## 2013-10-31 MED ORDER — METHOCARBAMOL 500 MG PO TABS
ORAL_TABLET | ORAL | Status: DC
Start: 2013-10-31 — End: 2013-12-20

## 2013-10-31 MED ORDER — HYDROCHLOROTHIAZIDE 25 MG PO TABS: 25.0000 mg | ORAL_TABLET | Freq: Every day | ORAL | Status: DC

## 2013-10-31 MED ORDER — AMLODIPINE BESYLATE 10 MG PO TABS: 10.0000 mg | ORAL_TABLET | Freq: Every day | ORAL | Status: DC

## 2013-10-31 MED ORDER — SUMATRIPTAN SUCCINATE 50 MG PO TABS: ORAL_TABLET | ORAL | Status: DC

## 2013-10-31 MED ORDER — METFORMIN HCL 500 MG PO TABS: 500.0000 mg | ORAL_TABLET | Freq: Two times a day (BID) | ORAL | Status: DC

## 2013-10-31 MED ORDER — CITALOPRAM HYDROBROMIDE 40 MG PO TABS: 40.0000 mg | ORAL_TABLET | Freq: Every day | ORAL | Status: DC

## 2013-10-31 NOTE — Progress Notes (Signed)
[facility] [address] [place] [address] [phone]  Date:  10/31/2013   Name:  Nicole Hubbard   DOB:  07-Nov-1965   MRN:  [id number]  PCP:  Lamar Blinks, MD    Chief Complaint: migraines and Diabetes   History of Present Illness:  Nicole Hubbard is a 48 y.o. very pleasant female patient who presents with the following:  Here today to recheck her migraine HA and diabetes.  She was admitted to [facility] back in August when she was suicidal. She is seeing a psychiatrist who is treating her with thorazine and celexa.  (actually she states she is just taking thorazine at night as needed for sleep- she just takes it a couple of times a week.  She thought these were her instructions for this med.) She would like to have a pap, but she got a period yesterday.  She has not menstruated in 5-6 years since she had an ablation.  She also needs refills today.   She had been on a cholesterol medication she thinks but is not sure what; she is not taking any statin currently. She is fasting today for labs.  She did have a cholesterol panel in August- ratio was 6. We will start a cholesterol med for her as she does not appear to be on anything currently.   She just finished an IOP program for depression. She is seeing a therapist and also Dr. Adele Schilder. She is now going to therapy every other week.   She is not having any suicidal thoughts.  She is back at work; she did an orientation last week and will go back into her full duties next week.  Overall she is doing much better from a psychiatric standpoint.    "I hardly ever take a drink now."    She also notes that she seems to have a lot of migraine HA with aura.  In the hospital she was given a muscle relaxer for same but did not feel like it helped her much.    Wt Readings from Last 3 Encounters:  10/31/13 310 lb 12.8 oz (140.978 kg)  10/19/13 311 lb (141.069 kg)  09/21/13 314 lb 3.2 oz (142.52 kg)   January 2012 she was at 333  lbs.    Lab Results  Component Value Date   HGBA1C 8.9* 08/17/2013     Patient Active Problem List   Diagnosis Date Noted  . MDD (major depressive disorder), recurrent severe, without psychosis 08/17/2013  . Morbid obesity 06/30/2012  . Depression 01/26/2012  . Diabetes mellitus, type 2 01/26/2012  . HTN (hypertension) 01/26/2012    Past Medical History  Diagnosis Date  . Depression   . Diabetes mellitus without complication   . Hypertension   . Chest pain   . Migraine   . Anxiety     Past Surgical History  Procedure Laterality Date  . Knee surgery    . Tonsillectomy      History  Substance Use Topics  . Smoking status: Former Smoker    Types: Cigarettes    Quit date: 09/15/2008  . Smokeless tobacco: Never Used  . Alcohol Use: 1.2 oz/week    2 Glasses of wine per week    Family History  Problem Relation Age of Onset  . Cancer Father     urithrial    No Known Allergies  Medication list has been reviewed and updated.  Current Outpatient Prescriptions on File Prior to Visit  Medication Sig Dispense Refill  . amLODipine (NORVASC) 10 MG tablet Take 1 tablet (10 mg total) by mouth daily. For high blood pressure      . chlorproMAZINE (THORAZINE) 25 MG tablet Take 1 tablet (25 mg total) by mouth at bedtime.  30 tablet  0  . citalopram (CELEXA) 40 MG tablet Take 1 tablet (40 mg total) by mouth daily. For depression  30 tablet  0  . hydrochlorothiazide (HYDRODIURIL) 25 MG tablet Take 1 tablet (25 mg total) by mouth daily. For high blood pressure  30 tablet  0  . losartan (COZAAR) 100 MG tablet Take 1 tablet (100 mg total) by mouth daily. For high blood pressure  30 tablet  0  . metFORMIN (GLUCOPHAGE) 500 MG tablet Take 1 tablet (500 mg total) by mouth 2 (two) times daily with a meal. For diabetes management  60 tablet  0  . zolpidem (AMBIEN) 10 MG tablet Take 1 tablet (10 mg total) by mouth at bedtime.  30 tablet  0   No current facility-administered medications on  file prior to visit.    Review of Systems:  As per HPI- otherwise negative.   Physical Examination: Filed Vitals:   10/31/13 1114  BP: 127/90  Pulse: 92  Temp: 98.1 F (36.7 C)  Resp: 18   Filed Vitals:   10/31/13 1114  Height: 5' 8.75" (1.746 m)  Weight: 310 lb 12.8 oz (140.978 kg)   Body mass index is 46.24 kg/(m^2). Ideal Body Weight: Weight in (lb) to have BMI = 25: 167.7  GEN: WDWN, NAD, Non-toxic, A & O x 3, obese, looks well.  In a calmer mood than I have seen in a while HEENT: Atraumatic, Normocephalic. Neck supple. No masses, No LAD. Ears and Nose: No external deformity. CV: RRR, No M/G/R. No JVD. No thrill. No extra heart sounds. PULM: CTA B, no wheezes, crackles, rhonchi. No retractions. No resp. distress. No accessory muscle use. ABD: S, NT, ND, +BS. No rebound. No HSM. EXTR: No c/c/e NEURO Normal gait.  PSYCH: Normally interactive. Conversant. Not depressed or anxious appearing.  Calm demeanor.  GU: normal external exam.  She has redundant vaginal tissue which limits visualization of cervix.  Light spotting is apparent.  Performed pap and BME- no adnexal masses or tenderness   Lab Results  Component Value Date   HGBA1C 7.3* 10/31/2013    Assessment and Plan: Diabetes mellitus type 2 in obese - Plan: Basic metabolic panel, Hemoglobin A1c, metFORMIN (GLUCOPHAGE) 500 MG tablet, DISCONTINUED: metFORMIN (GLUCOPHAGE) 500 MG tablet  Need for prophylactic vaccination and inoculation against influenza - Plan: Flu Vaccine QUAD 36+ mos IM  Screening for cervical cancer - Plan: Pap IG and HPV (high risk) DNA detection  Post-menopausal bleeding - Plan: HM Diabetes Foot Exam, Follicle Stimulating Hormone  Dyslipidemia - Plan: pravastatin (PRAVACHOL) 40 MG tablet  Migraine with aura and without status migrainosus, not intractable - Plan: SUMAtriptan (IMITREX) 50 MG tablet, methocarbamol (ROBAXIN) 500 MG tablet  Major depressive disorder, recurrent episode, moderate  - Plan: citalopram (CELEXA) 40 MG tablet  Essential hypertension - Plan: amLODipine (NORVASC) 10 MG tablet, losartan (COZAAR) 100 MG tablet, hydrochlorothiazide (HYDRODIURIL) 25 MG tablet, DISCONTINUED: hydrochlorothiazide (HYDRODIURIL) 25 MG tablet, DISCONTINUED: losartan (COZAAR) 100 MG tablet  Screening for STD (sexually transmitted disease) - Plan: HIV antibody  DM is under ok control.  Continue current regimen Pap today.  Possible post- menopausal bleeding.  Check FSH and plan to send her back to her OBG Start pravachol  for her dyslipidemia.  Plan to try imitrex for her migraine headache. Explained there is an increased risk of seratonin syndrome with celexa- she will use just as needed.  Also gave rx for robaxin to try for same, do not combine with ambien Refilled BP medication, BP is acceptable Refilled her celexa.  It is unclear is she is supposed to be taking thorazine, and there is a significant possibility of interaction between thorazine and celexa.  She has not been taking the thorazine regularly and is doing ok.  She will call her psychiatrist today and find out details.    Will plan further follow- up pending labs.   Signed Lamar Blinks, MD

## 2013-10-31 NOTE — Patient Instructions (Addendum)
We are going to try a medication called imitrex for your migraine headache.  This is a special medication that helps to stop the headache at it's start.  It will work best if you take it at the first sign of a migraine. This medication along with celexa may increase your risk of a rare condition called serotonin syndrome. However if you take the imitrex on occasion I doubt you will run into any trouble.    I am going to find out which GYN you saw in the past and will send you back to see them again I will be in touch with your labs from today Your cholesterol was indeed high- start on pravachol 40mg  once a day to control your cholesterol.    Continue to limit or abstain from alcohol, and try to exercise for your health and your mood.    We added robaxin to your regimen as well.  This is a muscle relaxer which you can also use for migraine as needed.  Remember this can make you sleepy so do not use it with your ambien.

## 2013-11-01 ENCOUNTER — Other Ambulatory Visit (HOSPITAL_COMMUNITY): Payer: PRIVATE HEALTH INSURANCE

## 2013-11-01 ENCOUNTER — Telehealth: Payer: Self-pay | Admitting: Family Medicine

## 2013-11-01 LAB — HIV ANTIBODY (ROUTINE TESTING W REFLEX): HIV 1&2 Ab, 4th Generation: NONREACTIVE

## 2013-11-01 LAB — FOLLICLE STIMULATING HORMONE: FSH: 9.4 m[IU]/mL

## 2013-11-01 NOTE — Telephone Encounter (Signed)
Called and was able to determine that she is a pt of Dr. Phineas Real at [facility]

## 2013-11-02 ENCOUNTER — Other Ambulatory Visit (HOSPITAL_COMMUNITY): Payer: PRIVATE HEALTH INSURANCE

## 2013-11-02 LAB — PAP IG AND HPV HIGH-RISK: HPV DNA High Risk: NOT DETECTED

## 2013-11-03 ENCOUNTER — Encounter: Payer: Self-pay | Admitting: Family Medicine

## 2013-11-03 ENCOUNTER — Other Ambulatory Visit (HOSPITAL_COMMUNITY): Payer: PRIVATE HEALTH INSURANCE

## 2013-11-04 ENCOUNTER — Other Ambulatory Visit (HOSPITAL_COMMUNITY): Payer: PRIVATE HEALTH INSURANCE

## 2013-11-07 ENCOUNTER — Other Ambulatory Visit (HOSPITAL_COMMUNITY): Payer: PRIVATE HEALTH INSURANCE
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
## 2013-11-07 NOTE — Addendum Note (Signed)
Addended by: Erin Sons D on: 11/07/2013 01:51 PM   Modules accepted: Orders

## 2013-11-08 ENCOUNTER — Other Ambulatory Visit (HOSPITAL_COMMUNITY): Payer: PRIVATE HEALTH INSURANCE

## 2013-11-09 ENCOUNTER — Other Ambulatory Visit (HOSPITAL_COMMUNITY): Payer: PRIVATE HEALTH INSURANCE

## 2013-11-10 ENCOUNTER — Other Ambulatory Visit: Payer: Self-pay

## 2013-11-17 ENCOUNTER — Ambulatory Visit (HOSPITAL_COMMUNITY): Payer: Self-pay | Admitting: Psychiatry

## 2013-11-21 ENCOUNTER — Ambulatory Visit (INDEPENDENT_AMBULATORY_CARE_PROVIDER_SITE_OTHER): Payer: 59 | Admitting: Psychiatry

## 2013-11-21 ENCOUNTER — Encounter (HOSPITAL_COMMUNITY): Payer: Self-pay | Admitting: Psychiatry

## 2013-11-21 VITALS — BP 136/88 | HR 105 | Ht 68.0 in | Wt 313.2 lb

## 2013-11-21 DIAGNOSIS — F331 Major depressive disorder, recurrent, moderate: Secondary | ICD-10-CM

## 2013-11-21 DIAGNOSIS — F1019 Alcohol abuse with unspecified alcohol-induced disorder: Secondary | ICD-10-CM

## 2013-11-21 MED ORDER — LAMOTRIGINE 25 MG PO TABS: ORAL_TABLET | ORAL | Status: DC

## 2013-11-21 MED ORDER — CHLORPROMAZINE HCL 25 MG PO TABS: 25.0000 mg | ORAL_TABLET | Freq: Every day | ORAL | Status: DC
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
## 2013-12-19 ENCOUNTER — Other Ambulatory Visit (HOSPITAL_COMMUNITY): Payer: Self-pay | Admitting: Psychiatry

## 2013-12-20 ENCOUNTER — Emergency Department (HOSPITAL_COMMUNITY)
Admission: EM | Admit: 2013-12-20 | Discharge: 2013-12-20 | Disposition: A | Discharge status: Home / Self Care | Payer: 59 | Attending: Emergency Medicine | Admitting: Emergency Medicine

## 2013-12-20 ENCOUNTER — Emergency Department (HOSPITAL_COMMUNITY): Payer: 59

## 2013-12-20 ENCOUNTER — Ambulatory Visit (INDEPENDENT_AMBULATORY_CARE_PROVIDER_SITE_OTHER): Payer: 59 | Admitting: Family Medicine

## 2013-12-20 ENCOUNTER — Encounter (HOSPITAL_COMMUNITY): Payer: Self-pay | Admitting: Emergency Medicine

## 2013-12-20 VITALS — BP 122/80 | HR 138 | Temp 98.6°F | Resp 36 | Wt 312.0 lb

## 2013-12-20 DIAGNOSIS — I10 Essential (primary) hypertension: Secondary | ICD-10-CM | POA: Insufficient documentation

## 2013-12-20 DIAGNOSIS — F419 Anxiety disorder, unspecified: Secondary | ICD-10-CM | POA: Diagnosis not present

## 2013-12-20 DIAGNOSIS — R079 Chest pain, unspecified: Secondary | ICD-10-CM

## 2013-12-20 DIAGNOSIS — F329 Major depressive disorder, single episode, unspecified: Secondary | ICD-10-CM | POA: Insufficient documentation

## 2013-12-20 DIAGNOSIS — Z79899 Other long term (current) drug therapy: Secondary | ICD-10-CM | POA: Diagnosis not present

## 2013-12-20 DIAGNOSIS — G43909 Migraine, unspecified, not intractable, without status migrainosus: Secondary | ICD-10-CM | POA: Insufficient documentation

## 2013-12-20 DIAGNOSIS — E119 Type 2 diabetes mellitus without complications: Secondary | ICD-10-CM | POA: Insufficient documentation

## 2013-12-20 DIAGNOSIS — R000 Tachycardia, unspecified: Secondary | ICD-10-CM | POA: Diagnosis not present

## 2013-12-20 DIAGNOSIS — Z87891 Personal history of nicotine dependence: Secondary | ICD-10-CM | POA: Diagnosis not present

## 2013-12-20 DIAGNOSIS — R0789 Other chest pain: Secondary | ICD-10-CM | POA: Diagnosis not present

## 2013-12-20 LAB — CBC WITH DIFFERENTIAL/PLATELET
Basophils Absolute: 0 10*3/uL (ref 0.0–0.1)
Basophils Relative: 0 % (ref 0–1)
Eosinophils Absolute: 0.2 10*3/uL (ref 0.0–0.7)
Eosinophils Relative: 3 % (ref 0–5)
HCT: 36.5 % (ref 36.0–46.0)
HEMOGLOBIN: 11.8 g/dL — AB (ref 12.0–15.0)
LYMPHS ABS: 2.7 10*3/uL (ref 0.7–4.0)
LYMPHS PCT: 42 % (ref 12–46)
MCH: 27.6 pg (ref 26.0–34.0)
MCHC: 32.3 g/dL (ref 30.0–36.0)
MCV: 85.5 fL (ref 78.0–100.0)
MONOS PCT: 6 % (ref 3–12)
Monocytes Absolute: 0.4 10*3/uL (ref 0.1–1.0)
NEUTROS ABS: 3.2 10*3/uL (ref 1.7–7.7)
NEUTROS PCT: 49 % (ref 43–77)
PLATELETS: 265 10*3/uL (ref 150–400)
RBC: 4.27 MIL/uL (ref 3.87–5.11)
RDW: 14.2 % (ref 11.5–15.5)
WBC: 6.5 10*3/uL (ref 4.0–10.5)

## 2013-12-20 LAB — D-DIMER, QUANTITATIVE (NOT AT ARMC)

## 2013-12-20 LAB — BASIC METABOLIC PANEL
ANION GAP: 12 (ref 5–15)
BUN: 10 mg/dL (ref 6–23)
CO2: 27 mEq/L (ref 19–32)
Calcium: 9.7 mg/dL (ref 8.4–10.5)
Chloride: 99 mEq/L (ref 96–112)
Creatinine, Ser: 0.8 mg/dL (ref 0.50–1.10)
GFR, EST NON AFRICAN AMERICAN: 85 mL/min — AB (ref >90)
Glucose, Bld: 106 mg/dL — ABNORMAL HIGH (ref 70–99)
Potassium: 3.5 mEq/L — ABNORMAL LOW (ref 3.7–5.3)
SODIUM: 138 meq/L (ref 137–147)

## 2013-12-20 LAB — I-STAT TROPONIN, ED: TROPONIN I, POC: 0 ng/mL (ref 0.00–0.08)

## 2013-12-20 MED ORDER — NAPROXEN 500 MG PO TABS
500.0000 mg | ORAL_TABLET | Freq: Two times a day (BID) | ORAL | Status: DC
Start: 2013-12-20 — End: 2014-10-04

## 2013-12-20 MED ORDER — ONDANSETRON HCL 4 MG/2ML IJ SOLN
4.0000 mg | Freq: Once | INTRAMUSCULAR | Status: AC
  Administered 2013-12-20: 4 mg via INTRAVENOUS
  Filled 2013-12-20: qty 2

## 2013-12-20 MED ORDER — ASPIRIN 81 MG PO CHEW
324.0000 mg | CHEWABLE_TABLET | Freq: Once | ORAL | Status: AC
  Administered 2013-12-20: 324 mg via ORAL

## 2013-12-20 MED ORDER — HYDROCODONE-ACETAMINOPHEN 5-325 MG PO TABS
2.0000 | ORAL_TABLET | Freq: Once | ORAL | Status: AC
  Administered 2013-12-20: 2 via ORAL
  Filled 2013-12-20: qty 2

## 2013-12-20 MED ORDER — HYDROCODONE-ACETAMINOPHEN 5-325 MG PO TABS: 2.0000 | ORAL_TABLET | ORAL | Status: DC | PRN

## 2013-12-20 MED ORDER — MORPHINE SULFATE 4 MG/ML IJ SOLN
4.0000 mg | INTRAMUSCULAR | Status: DC | PRN
Start: 2013-12-20 — End: 2013-12-21
  Administered 2013-12-20 (×2): 4 mg via INTRAVENOUS
  Filled 2013-12-20 (×2): qty 1

## 2013-12-20 NOTE — Progress Notes (Signed)
Subjective:    Patient ID: Nicole Hubbard, female    DOB: 04/29/1965, 48 y.o.   MRN: [id number]  This chart was scribed for Merri Ray, MD by Hilda Lias, ED Scribe. The patient's care was started at 3:49 PM.   HPI   HPI Comments: Nicole Hubbard is a 48 y.o. female with a history of Diabetes with last A1C 7.3 in October, HTN, hyperlipidemia, obesity, and Hx of depression who presents to the [facility] complaining of persistent left-sided chest pain that has worsened since its onset 4 days ago. Worsened since yesterday. Pt states that the pain is sharp, and she feels pressure as well that she rates as a 9.5/10. Pt states that her pain radiates into her left neck area and left upper and lower back. Pt states that she took a Tylenol and Gas-x PTA with no relief. Pt denies any recent travel on airplane or in car for extended periods of time. No recent surgery. Pt denies nausea, vomiting, or diaphoresis, calf pain or swelling, or SOB. Pt denies doing any new activity or activity out of the ordinary. Pt denies PMHx of heart problems, and states that her FHx of heart problems is unknown.      Patient Active Problem List   Diagnosis Date Noted  . MDD (major depressive disorder), recurrent severe, without psychosis 08/17/2013  . Morbid obesity 06/30/2012  . Depression 01/26/2012  . Diabetes mellitus, type 2 01/26/2012  . HTN (hypertension) 01/26/2012   Past Medical History  Diagnosis Date  . Depression   . Diabetes mellitus without complication   . Hypertension   . Chest pain   . Migraine   . Anxiety    Past Surgical History  Procedure Laterality Date  . Knee surgery    . Tonsillectomy     No Known Allergies Prior to Admission medications   Medication Sig Start Date End Date Taking? Authorizing Provider  amLODipine (NORVASC) 10 MG tablet Take 1 tablet (10 mg total) by mouth daily. For high blood pressure 10/31/13  Yes Jessica C Copland, MD  chlorproMAZINE  (THORAZINE) 25 MG tablet Take 1 tablet (25 mg total) by mouth at bedtime. 11/21/13  Yes Kathlee Nations, MD  citalopram (CELEXA) 40 MG tablet Take 1 tablet (40 mg total) by mouth daily. For depression 10/31/13  Yes Gay Filler Copland, MD  hydrochlorothiazide (HYDRODIURIL) 25 MG tablet Take 1 tablet (25 mg total) by mouth daily. For high blood pressure 10/31/13  Yes Gay Filler Copland, MD  lamoTRIgine (LAMICTAL) 25 MG tablet Take 1 tab daily for 1 week and than 2 tab daily 11/21/13  Yes Kathlee Nations, MD  losartan (COZAAR) 100 MG tablet Take 1 tablet (100 mg total) by mouth daily. For high blood pressure 10/31/13  Yes Gay Filler Copland, MD  metFORMIN (GLUCOPHAGE) 500 MG tablet Take 1 tablet (500 mg total) by mouth 2 (two) times daily with a meal. For diabetes management 10/31/13  Yes Gay Filler Copland, MD  pravastatin (PRAVACHOL) 40 MG tablet Take 1 tablet (40 mg total) by mouth daily. 10/31/13  Yes Gay Filler Copland, MD  SUMAtriptan (IMITREX) 50 MG tablet Take 1 - 2 tablets as needed for migraine.  Max 100 mg in 24 hours 10/31/13  Yes Gay Filler Copland, MD  zolpidem (AMBIEN) 10 MG tablet Take 1 tablet (10 mg total) by mouth at bedtime. 10/19/13  Yes Kathlee Nations, MD   History   Social History  . Marital Status:  Divorced    Spouse Name: N/A    Number of Children: N/A  . Years of Education: N/A   Occupational History  . Not on file.   Social History Main Topics  . Smoking status: Former Smoker    Types: Cigarettes    Quit date: 09/15/2008  . Smokeless tobacco: Never Used  . Alcohol Use: 1.2 oz/week    2 Glasses of wine per week  . Drug Use: No  . Sexual Activity: Yes    Birth Control/ Protection: None   Other Topics Concern  . Not on file   Social History Narrative      Review of Systems  Constitutional: Negative for diaphoresis.  Respiratory: Negative for shortness of breath.   Cardiovascular: Positive for chest pain. Negative for leg swelling.  Gastrointestinal: Negative for  nausea and vomiting.  Musculoskeletal: Positive for neck pain.       Objective:   Physical Exam  Constitutional: She is oriented to person, place, and time. She appears well-developed and well-nourished.  HENT:  Head: Normocephalic and atraumatic.  Eyes: Conjunctivae and EOM are normal. Pupils are equal, round, and reactive to light.  Neck: Carotid bruit is not present.  Cardiovascular: Regular rhythm, normal heart sounds and intact distal pulses.   Tachycardic   Regular rhythm  Deep breathing increases pain  Pulmonary/Chest: Effort normal and breath sounds normal. She has no wheezes. She has no rales.  reproduceable pain in upper left chest Pain is deeper than she is able to palpate  Abdominal: Soft. She exhibits no pulsatile midline mass. There is no tenderness.  Musculoskeletal:  Calves non-tender no swelling Negative Homans   Neurological: She is alert and oriented to person, place, and time.  Skin: Skin is warm and dry.  Psychiatric: She has a normal mood and affect. Her behavior is normal.  Vitals reviewed.    Filed Vitals:   12/20/13 1536 12/20/13 1620  BP: 110/80 122/80  Pulse: 138   Temp: 98.6 F (37 C)   Resp: 36   Weight: 312 lb (141.522 kg)   SpO2: 95%    EKG; Sinus tachycardia with rate of 122. Compared to previous EKG on 08/17/13: There is some flattening of the T-wave in Lead II and Lead AVF with possible TWI in Lead III with nonspecific T-wave flattening in Lead V6. No ectopic beats on rhythm strip.   4:02 PM Monitor placed.  Sinus rhythm rate 118. Aspirin 362 mg chewable given   4:06 PM EMS called for transport. Camera operator at [facility] advised. IV placed - 22gauge, L hand, NS.      Assessment & Plan:   Nicole Hubbard is a 48 y.o. female Chest pain, unspecified chest pain type - Plan: EKG 12-Lead, aspirin chewable tablet 324 mg  Essential hypertension  Type 2 diabetes mellitus without complication  5 day history of L sided chest pain with  some radiation to L neck/upper back, nut no associated N/V/diaphoresis. Possible nonspecific t wave changes on inferior leads and V6 when compared to prior EKG. No known risk factors for PE, but tachycardic.  Cardiac risk factors of HTN, hyperlipidemia, DM2. Some reproducible pain susgestive of chest wall/MSK source, but lower pressure with some radiation to neck and back less reassuring.   -IV placed, ASA 324mg  chewable given. Once IV placed - repeat BP ok, but EMS on scene - report given. No NTG was given in our office. Charge nurse at [facility] advised. Transfer of care to EMS at 1625.  Meds ordered this encounter  Medications  . aspirin chewable tablet 324 mg    Sig:    Patient Instructions  We will send you to [facility] ER for evaluation of this chest pain.  I have advised the charge nurse at [facility].     I personally performed the services described in this documentation, which was scribed in my presence. The recorded information has been reviewed and considered, and addended by me as needed.

## 2013-12-20 NOTE — ED Notes (Signed)
Pt ambulating independently w/ steady gait on d/c in no acute distress, A&Ox4. D/c instructions reviewed w/ pt and family - pt and family deny any further questions or concerns at present. Rx given x2  

## 2013-12-20 NOTE — ED Provider Notes (Signed)
CSN: [id number]     Arrival date & time 12/20/13  1657 History   First MD Initiated Contact with Patient 12/20/13 1702     Chief Complaint  Patient presents with  . Chest Pain      HPI  Patient presents for evaluation of chest pain. Describes left-sided chest pain anteriorly for the last 5 days. States this started Friday. No new activities or work or use of her arms or chest and would explain muscular pain. States it hurts to grab the bedrails and set up. Tender to touch across her chest. No cough, no fever, does not feel short of breath. No exertional pain or symptoms. No leg swelling. No history of DVT or PE for her or family. No history of heart disease in the family. Risk factors include hypertension diabetes. Heart score of 2.  No history of prolonged immobilization, cast, splints, fractures, or DVT risks.  Past Medical History  Diagnosis Date  . Depression   . Diabetes mellitus without complication   . Hypertension   . Chest pain   . Migraine   . Anxiety    Past Surgical History  Procedure Laterality Date  . Knee surgery    . Tonsillectomy     Family History  Problem Relation Age of Onset  . Cancer Father     urithrial   History  Substance Use Topics  . Smoking status: Former Smoker    Types: Cigarettes    Quit date: 09/15/2008  . Smokeless tobacco: Never Used  . Alcohol Use: 1.2 oz/week    2 Glasses of wine per week   OB History    No data available     Review of Systems  Constitutional: Negative for fever, chills, diaphoresis, appetite change and fatigue.  HENT: Negative for mouth sores, sore throat and trouble swallowing.   Eyes: Negative for visual disturbance.  Respiratory: Negative for cough, chest tightness, shortness of breath and wheezing.   Cardiovascular: Positive for chest pain.  Gastrointestinal: Negative for nausea, vomiting, abdominal pain, diarrhea and abdominal distention.  Endocrine: Negative for polydipsia, polyphagia and polyuria.    Genitourinary: Negative for dysuria, frequency and hematuria.  Musculoskeletal: Negative for gait problem.  Skin: Negative for color change, pallor and rash.  Neurological: Negative for dizziness, syncope, light-headedness and headaches.  Hematological: Does not bruise/bleed easily.  Psychiatric/Behavioral: Negative for behavioral problems and confusion.      Allergies  Review of patient's allergies indicates no known allergies.  Home Medications   Prior to Admission medications   Medication Sig Start Date End Date Taking? Authorizing Provider  amLODipine (NORVASC) 10 MG tablet Take 1 tablet (10 mg total) by mouth daily. For high blood pressure 10/31/13  Yes Jessica C Copland, MD  chlorproMAZINE (THORAZINE) 25 MG tablet Take 1 tablet (25 mg total) by mouth at bedtime. 11/21/13  Yes Kathlee Nations, MD  citalopram (CELEXA) 40 MG tablet Take 1 tablet (40 mg total) by mouth daily. For depression 10/31/13  Yes Gay Filler Copland, MD  hydrochlorothiazide (HYDRODIURIL) 25 MG tablet Take 1 tablet (25 mg total) by mouth daily. For high blood pressure 10/31/13  Yes Gay Filler Copland, MD  lamoTRIgine (LAMICTAL) 25 MG tablet Take 1 tab daily for 1 week and than 2 tab daily 11/21/13  Yes Kathlee Nations, MD  losartan (COZAAR) 100 MG tablet Take 1 tablet (100 mg total) by mouth daily. For high blood pressure 10/31/13  Yes Gay Filler Copland, MD  metFORMIN (GLUCOPHAGE) 500 MG tablet Take  1 tablet (500 mg total) by mouth 2 (two) times daily with a meal. For diabetes management 10/31/13  Yes Gay Filler Copland, MD  pravastatin (PRAVACHOL) 40 MG tablet Take 1 tablet (40 mg total) by mouth daily. 10/31/13  Yes Gay Filler Copland, MD  SUMAtriptan (IMITREX) 50 MG tablet Take 1 - 2 tablets as needed for migraine.  Max 100 mg in 24 hours 10/31/13  Yes Gay Filler Copland, MD  zolpidem (AMBIEN) 10 MG tablet Take 1 tablet (10 mg total) by mouth at bedtime. 10/19/13  Yes Kathlee Nations, MD   BP 108/71 mmHg  Pulse 102   Temp(Src) 97.7 F (36.5 C) (Oral)  Resp 19  Ht 5\' 9"  (1.753 m)  Wt 312 lb (141.522 kg)  BMI 46.05 kg/m2  SpO2 92%  LMP 11/30/2013 Physical Exam  Constitutional: She is oriented to person, place, and time. She appears well-developed and well-nourished. No distress.  HENT:  Head: Normocephalic.  Eyes: Conjunctivae are normal. Pupils are equal, round, and reactive to light. No scleral icterus.  Neck: Normal range of motion. Neck supple. No thyromegaly present.  Cardiovascular: Normal rate and regular rhythm.  Exam reveals no gallop and no friction rub.   No murmur heard. Sinus on the monitor. Rate varies from 95-105 at rest.  Pulmonary/Chest: Effort normal and breath sounds normal. No respiratory distress. She has no wheezes. She has no rales.    Diffuse left pectoral tenderness. Clear bilateral breath sounds.  Abdominal: Soft. Bowel sounds are normal. She exhibits no distension. There is no tenderness. There is no rebound.  Musculoskeletal: Normal range of motion.  Neurological: She is alert and oriented to person, place, and time.  Skin: Skin is warm and dry. No rash noted.  Psychiatric: She has a normal mood and affect. Her behavior is normal.    ED Course  Procedures (including critical care time) Labs Review Labs Reviewed  BASIC METABOLIC PANEL - Abnormal; Notable for the following:    Potassium 3.5 (*)    Glucose, Bld 106 (*)    GFR calc non Af Amer 85 (*)    All other components within normal limits  CBC WITH DIFFERENTIAL - Abnormal; Notable for the following:    Hemoglobin 11.8 (*)    All other components within normal limits  D-DIMER, QUANTITATIVE  I-STAT TROPOININ, ED    Imaging Review No results found.   EKG Interpretation   Date/Time:  Tuesday December 20 2013 17:04:46 EST Ventricular Rate:  105 PR Interval:  169 QRS Duration: 98 QT Interval:  371 QTC Calculation: 490 R Axis:   40 Text Interpretation:  Sinus tachycardia No ischemia, no ectopy  Confirmed  by Jeneen Rinks  MD, McArthur ([id number]) on 12/20/2013 6:29:57 PM      MDM   Final diagnoses:  Chest pain    Reproducible chest wall pain. Also reproduced with use and movement of her upper extremities. No specific injury or mechanism for chest wall or muscular pain. Tachycardic at rest. We'll screen with d-dimer, CT angiogram is indicated. Not hypoxemic or febrile. No symptoms of pneumonia and normal lung exam. Troponin and d-dimer are pending. IV placed. Morphine for pain control. Reevaluation.  Normal x-ray. Negative d-dimer. Symptoms improving after pain control and medications. Not hypoxic. Not febrile. Appropriate for outpatient treatment for chest wall pain.    Tanna Furry, MD 12/20/13 2049

## 2013-12-20 NOTE — Patient Instructions (Signed)
We will send you to [facility] ER for evaluation of this chest pain.  I have advised the charge nurse at [facility].

## 2013-12-20 NOTE — ED Notes (Signed)
Per EMS, pt coming from urgent care to be evaluated for left sided CP radiating to her neck. Pt reports that the pain started 5 days ago. EMS gave 324 of aspirin and 2 nitro in route which brought the pain from a 9 to an 8. NAD at this time. Pt alert x4.

## 2013-12-20 NOTE — Discharge Instructions (Signed)

## 2013-12-21 ENCOUNTER — Other Ambulatory Visit (HOSPITAL_COMMUNITY): Payer: Self-pay | Admitting: Psychiatry

## 2013-12-21 ENCOUNTER — Ambulatory Visit (HOSPITAL_COMMUNITY): Payer: Self-pay | Admitting: Psychiatry

## 2013-12-28 ENCOUNTER — Encounter: Payer: Self-pay | Admitting: Family Medicine

## 2014-01-09 ENCOUNTER — Telehealth: Payer: Self-pay

## 2014-01-09 ENCOUNTER — Other Ambulatory Visit: Payer: Self-pay

## 2014-01-09 DIAGNOSIS — F331 Major depressive disorder, recurrent, moderate: Secondary | ICD-10-CM

## 2014-01-09 NOTE — Telephone Encounter (Signed)
Pt is needing to talk with dr copland about being referred to [facility]

## 2014-01-09 NOTE — Telephone Encounter (Signed)
I have not rx this for her- will forward to Dr. Adele Schilder.  LMOM for pt letting her know this plan.

## 2014-01-09 NOTE — Telephone Encounter (Signed)
Pt requesting refill on zolpidem (AMBIEN) 10 MG tablet [[id number]]

## 2014-01-09 NOTE — Telephone Encounter (Signed)
Dr Lorelei Pont, I don't know that you have Rxd this for pt in past. It looks like last filled in [facility] by Dr Berniece Andreas. Pended.

## 2014-01-10 NOTE — Telephone Encounter (Signed)
Lm for pt- RTC to see Dr. Lorelei Pont to discuss- left clinic hours for Dr. Lorelei Pont this week.

## 2014-01-14 ENCOUNTER — Other Ambulatory Visit (HOSPITAL_COMMUNITY): Payer: Self-pay | Admitting: Psychiatry

## 2014-01-17 ENCOUNTER — Other Ambulatory Visit (HOSPITAL_COMMUNITY): Payer: Self-pay | Admitting: Psychiatry

## 2014-01-19 ENCOUNTER — Other Ambulatory Visit (HOSPITAL_COMMUNITY): Payer: Self-pay | Admitting: Psychiatry

## 2014-01-22 ENCOUNTER — Other Ambulatory Visit (HOSPITAL_COMMUNITY): Payer: Self-pay | Admitting: Psychiatry

## 2014-01-23 ENCOUNTER — Other Ambulatory Visit (HOSPITAL_COMMUNITY): Payer: Self-pay | Admitting: Psychiatry

## 2014-02-06 ENCOUNTER — Ambulatory Visit (INDEPENDENT_AMBULATORY_CARE_PROVIDER_SITE_OTHER): Payer: 59

## 2014-02-06 ENCOUNTER — Ambulatory Visit (INDEPENDENT_AMBULATORY_CARE_PROVIDER_SITE_OTHER): Payer: 59 | Admitting: Family Medicine

## 2014-02-06 ENCOUNTER — Ambulatory Visit: Payer: Self-pay | Admitting: Family Medicine

## 2014-02-06 VITALS — BP 128/82 | HR 90 | Temp 98.2°F | Resp 17 | Ht 69.0 in | Wt 312.0 lb

## 2014-02-06 DIAGNOSIS — M5441 Lumbago with sciatica, right side: Secondary | ICD-10-CM

## 2014-02-06 MED ORDER — HYDROCODONE-ACETAMINOPHEN 5-325 MG PO TABS: 1.0000 | ORAL_TABLET | Freq: Four times a day (QID) | ORAL | Status: DC | PRN

## 2014-02-06 MED ORDER — METHOCARBAMOL 500 MG PO TABS: 500.0000 mg | ORAL_TABLET | Freq: Three times a day (TID) | ORAL | Status: DC | PRN

## 2014-02-06 NOTE — Progress Notes (Signed)
[facility] [address], [place] [address] [phone]  Date:  02/06/2014   Name:  Nicole Hubbard   DOB:  1965-02-05   MRN:  [id number]  PCP:  Lamar Blinks, MD    Chief Complaint: Back Pain   History of Present Illness:  Nicole Hubbard is a 49 y.o. very pleasant female patient who presents with the following:  She has noted back pain "for a while" but she has not really dealt with it until now.  She is currently trying to do something about her weight and exercise more. She has been walking for exercise, but this does seem to exacerbate her pain down her right leg.  She decreased her walking and is now doing water aerobics which she tolerates well.  However she notes the pain has been persistent in her back over the last several years. She has noted the pain for about 8 years since an accident at work.  The pain is in the center of her lower back and through her buttocks, and down her right leg.   She notes the pain more with prolonged sitting and laying down.  Notes that she is kept awake by pain a fair amount.    Lab Results  Component Value Date   HGBA1C 7.3* 10/31/2013     No numbness or weakness in her leg, no bowel or bladder control issues.   She has not had an MRI or any back surgery.  No recent plain films of her back.    Her LMP was a couple of days ago- she is s/p ablation and bleeds minimally  She is doing well with alcohol lately- had a glass of champagne on new years but otherwise none recently   Patient Active Problem List   Diagnosis Date Noted  . MDD (major depressive disorder), recurrent severe, without psychosis 08/17/2013  . Morbid obesity 06/30/2012  . Depression 01/26/2012  . Diabetes mellitus, type 2 01/26/2012  . HTN (hypertension) 01/26/2012    Past Medical History  Diagnosis Date  . Depression   . Diabetes mellitus without complication   . Hypertension   . Chest pain   . Migraine   . Anxiety     Past Surgical  History  Procedure Laterality Date  . Knee surgery    . Tonsillectomy      History  Substance Use Topics  . Smoking status: Former Smoker    Types: Cigarettes    Quit date: 09/15/2008  . Smokeless tobacco: Never Used  . Alcohol Use: 1.2 oz/week    2 Glasses of wine per week    Family History  Problem Relation Age of Onset  . Cancer Father     urithrial    No Known Allergies  Medication list has been reviewed and updated.  Current Outpatient Prescriptions on File Prior to Visit  Medication Sig Dispense Refill  . amLODipine (NORVASC) 10 MG tablet Take 1 tablet (10 mg total) by mouth daily. For high blood pressure 90 tablet 2  . chlorproMAZINE (THORAZINE) 25 MG tablet TAKE 1 TABLET (25 MG TOTAL) BY MOUTH AT BEDTIME. 30 tablet 0  . citalopram (CELEXA) 40 MG tablet Take 1 tablet (40 mg total) by mouth daily. For depression 90 tablet 3  . hydrochlorothiazide (HYDRODIURIL) 25 MG tablet Take 1 tablet (25 mg total) by mouth daily. For high blood pressure 90 tablet 2  . HYDROcodone-acetaminophen (NORCO/VICODIN) 5-325 MG per tablet Take 2 tablets by mouth every 4 (four)  hours as needed. 10 tablet 0  . lamoTRIgine (LAMICTAL) 25 MG tablet Take 2 tablets (50 mg total) by mouth daily. 30 days only. 60 tablet 0  . losartan (COZAAR) 100 MG tablet Take 1 tablet (100 mg total) by mouth daily. For high blood pressure 90 tablet 2  . metFORMIN (GLUCOPHAGE) 500 MG tablet Take 1 tablet (500 mg total) by mouth 2 (two) times daily with a meal. For diabetes management 180 tablet 2  . naproxen (NAPROSYN) 500 MG tablet Take 1 tablet (500 mg total) by mouth 2 (two) times daily. 30 tablet 0  . pravastatin (PRAVACHOL) 40 MG tablet Take 1 tablet (40 mg total) by mouth daily. 90 tablet 3  . SUMAtriptan (IMITREX) 50 MG tablet Take 1 - 2 tablets as needed for migraine.  Max 100 mg in 24 hours 10 tablet 0  . zolpidem (AMBIEN) 10 MG tablet Take 1 tablet (10 mg total) by mouth at bedtime. 30 tablet 0   No current  facility-administered medications on file prior to visit.    Review of Systems:  As per HPI- otherwise negative.   Physical Examination: Filed Vitals:   02/06/14 1458  BP: 128/82  Pulse: 103  Temp: 98.2 F (36.8 C)  Resp: 17   Filed Vitals:   02/06/14 1458  Height: 5\' 9"  (1.753 m)  Weight: 312 lb (141.522 kg)   Body mass index is 46.05 kg/(m^2). Ideal Body Weight: Weight in (lb) to have BMI = 25: 168.9  GEN: WDWN, NAD, Non-toxic, A & O x 3, obese as per her baseline and looks well HEENT: Atraumatic, Normocephalic. Neck supple. No masses, No LAD. Ears and Nose: No external deformity. CV: RRR, No M/G/R. No JVD. No thrill. No extra heart sounds. PULM: CTA B, no wheezes, crackles, rhonchi. No retractions. No resp. distress. No accessory muscle use. EXTR: No c/c/e NEURO Normal gait.  PSYCH: Normally interactive. Conversant. Not depressed or anxious appearing.  Calm demeanor.  Slightly positive SLR on the right only.  Tenderness in her lower back, some restriction of flexion Normal BLE strength and normal bilateral LE sensation, symmetric decreased patellar DTR bilaterally   [facility] reading (PRIMARY) by  Dr. Lorelei Pont. Lumbar spine: mild disc space loss at L5/S1 but otherwise negative  CLINICAL DATA: 49 year old female with chronic low back pain. Initial encounter.  EXAM: LUMBAR SPINE - COMPLETE 4+ VIEW  COMPARISON: None.  FINDINGS: Five non rib-bearing lumbar type vertebra are identified in normal alignment.  There is no evidence of acute fracture or subluxation.  No focal bony lesions or spondylolysis noted.  Mild to moderate degenerative disc disease and facet arthropathy at L5-S1 noted.  IMPRESSION: No evidence of acute abnormality.  Mild to moderate degenerative changes at L5-S1.  Assessment and Plan: Bilateral low back pain with right-sided sciatica - Plan: DG Lumbar Spine Complete, HYDROcodone-acetaminophen (NORCO/VICODIN) 5-325 MG per tablet,  methocarbamol (ROBAXIN) 500 MG tablet  Nicole Hubbard has a long history of back pain.  Recently tried to increase her exercise amount and seems to have exacerbated her back pain.  She also is quite obese at baseline while pre- disposes her to back issues.   She will continue with water aerobics She is no longer using ambien or thorazine.  She may use robaxin as needed for muscle spasm.  Also gave her a small supply of vicodin to use for more severe pain at night.   She will see me next month for a recheck and her FMLA update   Signed Janett Billow Enedina Pair,  MD   

## 2014-02-06 NOTE — Patient Instructions (Addendum)
Use the robaxin as needed for muscle pain, and the hydrocodone as needed for more severe pain  Remember these medications can be sedating so do not use them when you need to drive.  Also avoid using them in combination  Keep up the good work with your exercise routine Let me know if this does not help with you back pain We'll look forward to seeing you next month for your diabetes recheck and to do your FMLA paperwork

## 2014-03-13 ENCOUNTER — Ambulatory Visit (INDEPENDENT_AMBULATORY_CARE_PROVIDER_SITE_OTHER): Payer: 59 | Admitting: Family Medicine

## 2014-03-13 ENCOUNTER — Encounter: Payer: Self-pay | Admitting: Family Medicine

## 2014-03-13 VITALS — BP 110/78 | HR 89 | Temp 98.4°F | Resp 16 | Ht 69.5 in | Wt 308.2 lb

## 2014-03-13 DIAGNOSIS — G8929 Other chronic pain: Secondary | ICD-10-CM

## 2014-03-13 DIAGNOSIS — E669 Obesity, unspecified: Secondary | ICD-10-CM

## 2014-03-13 DIAGNOSIS — E119 Type 2 diabetes mellitus without complications: Secondary | ICD-10-CM

## 2014-03-13 DIAGNOSIS — M549 Dorsalgia, unspecified: Secondary | ICD-10-CM

## 2014-03-13 DIAGNOSIS — E1169 Type 2 diabetes mellitus with other specified complication: Secondary | ICD-10-CM

## 2014-03-13 DIAGNOSIS — F3340 Major depressive disorder, recurrent, in remission, unspecified: Secondary | ICD-10-CM

## 2014-03-13 MED ORDER — CYCLOBENZAPRINE HCL 10 MG PO TABS: 10.0000 mg | ORAL_TABLET | Freq: Two times a day (BID) | ORAL | Status: DC | PRN

## 2014-03-13 NOTE — Progress Notes (Signed)
[facility] [address], [place] [address] [phone]  Date:  03/13/2014   Name:  Nicole Hubbard   DOB:  [birth year]   MRN:  [id number]  PCP:  Lamar Blinks, MD    Chief Complaint: Diabetes and Hypertension   History of Present Illness:  Nicole Hubbard is a 49 y.o. very pleasant female patient who presents with the following:  Here today to discuss FMLA paperwork and also to follow-up her DM.  She was admitted to [facility] last fall and missed about 4 months of work due to depression.  She is now back at work and overall is much better. She is working on her weight and hopes that she will continue to be suceessful in her weight loss efforts.   Lab Results  Component Value Date   HGBA1C 7.3* 10/31/2013   She is fasting today for labs.  She plans to see her eye doctor soon- she is overdue for her diabetic eye exam.   She continues to have some restrictions on her work due to her depression- she is allowed to miss 28 hours per month.  This allows her to see her counselor twice a week.  She feels that she is really getting along with her counselor and making good progress.   She really finds that talking about her problems is helpful for her.    She also has chronic back pain. She had used some flexeril in the past for her back and this seemed to help her more than hydrocodone.  She would like to have some of this to use as needed if possible   Wt Readings from Last 3 Encounters:  03/13/14 308 lb 3.2 oz (139.799 kg)  02/06/14 312 lb (141.522 kg)  12/20/13 312 lb (141.522 kg)     Patient Active Problem List   Diagnosis Date Noted  . MDD (major depressive disorder), recurrent severe, without psychosis 08/17/2013  . Morbid obesity 06/30/2012  . Depression 01/26/2012  . Diabetes mellitus, type 2 01/26/2012  . HTN (hypertension) 01/26/2012    Past Medical History  Diagnosis Date  . Depression   . Diabetes mellitus without complication   . Hypertension   .  Chest pain   . Migraine   . Anxiety     Past Surgical History  Procedure Laterality Date  . Knee surgery    . Tonsillectomy      History  Substance Use Topics  . Smoking status: Former Smoker    Types: Cigarettes    Quit date: 09/15/2008  . Smokeless tobacco: Never Used  . Alcohol Use: 1.2 oz/week    2 Glasses of wine per week    Family History  Problem Relation Age of Onset  . Cancer Father     urithrial    No Known Allergies  Medication list has been reviewed and updated.  Current Outpatient Prescriptions on File Prior to Visit  Medication Sig Dispense Refill  . amLODipine (NORVASC) 10 MG tablet Take 1 tablet (10 mg total) by mouth daily. For high blood pressure 90 tablet 2  . citalopram (CELEXA) 40 MG tablet Take 1 tablet (40 mg total) by mouth daily. For depression 90 tablet 3  . hydrochlorothiazide (HYDRODIURIL) 25 MG tablet Take 1 tablet (25 mg total) by mouth daily. For high blood pressure 90 tablet 2  . HYDROcodone-acetaminophen (NORCO/VICODIN) 5-325 MG per tablet Take 1 tablet by mouth every 6 (six) hours as needed. 20 tablet 0  . lamoTRIgine (LAMICTAL)  25 MG tablet Take 2 tablets (50 mg total) by mouth daily. 30 days only. 60 tablet 0  . losartan (COZAAR) 100 MG tablet Take 1 tablet (100 mg total) by mouth daily. For high blood pressure 90 tablet 2  . metFORMIN (GLUCOPHAGE) 500 MG tablet Take 1 tablet (500 mg total) by mouth 2 (two) times daily with a meal. For diabetes management 180 tablet 2  . methocarbamol (ROBAXIN) 500 MG tablet Take 1 tablet (500 mg total) by mouth every 8 (eight) hours as needed. 30 tablet 0  . naproxen (NAPROSYN) 500 MG tablet Take 1 tablet (500 mg total) by mouth 2 (two) times daily. 30 tablet 0  . pravastatin (PRAVACHOL) 40 MG tablet Take 1 tablet (40 mg total) by mouth daily. 90 tablet 3  . SUMAtriptan (IMITREX) 50 MG tablet Take 1 - 2 tablets as needed for migraine.  Max 100 mg in 24 hours 10 tablet 0   No current  facility-administered medications on file prior to visit.    Review of Systems:  As per HPI- otherwise negative.   Physical Examination: Filed Vitals:   03/13/14 1120  BP: 110/78  Pulse: 89  Temp: 98.4 F (36.9 C)  Resp: 16   Filed Vitals:   03/13/14 1120  Height: 5' 9.5" (1.765 m)  Weight: 308 lb 3.2 oz (139.799 kg)   Body mass index is 44.88 kg/(m^2). Ideal Body Weight: Weight in (lb) to have BMI = 25: 171.4  GEN: WDWN, NAD, Non-toxic, A & O x 3, obese, looks well HEENT: Atraumatic, Normocephalic. Neck supple. No masses, No LAD. Ears and Nose: No external deformity. CV: RRR, No M/G/R. No JVD. No thrill. No extra heart sounds. PULM: CTA B, no wheezes, crackles, rhonchi. No retractions. No resp. distress. No accessory muscle use. EXTR: No c/c/e NEURO Normal gait.  PSYCH: Normally interactive. Conversant. Not depressed or anxious appearing.  Calm demeanor.  Foot exam done today  Assessment and Plan: Major depressive disorder, recurrent, in remission  Chronic back pain - Plan: cyclobenzaprine (FLEXERIL) 10 MG tablet  Diabetes mellitus type 2 in obese - Plan: [facility] Diabetes Foot Exam, Lipid panel, Microalbumin, urine, Basic metabolic panel, Hemoglobin A1c  Morbid obesity   Labs pending for her DM as above- will contact her with results.  Continue medication and counseling for her depression.  She is doing well overall.  She is continuing to work on her weight and has lost 4 lbs.   Completed FMLA paperwork for her- she is allowed to miss 28 hours a month.  This allows her to attend her twice weekly counseling session and also to use her other self- treatment techniques such as exercise when needed   Signed Lamar Blinks, MD

## 2014-03-13 NOTE — Patient Instructions (Signed)
Great to see you today and awesome job with your weight loss so far!  I will be in touch with your labs Please plan to see me in about 6 months

## 2014-03-14 ENCOUNTER — Encounter: Payer: Self-pay | Admitting: Family Medicine

## 2014-03-14 LAB — MICROALBUMIN, URINE: MICROALB UR: 1.8 mg/dL (ref <2.0)

## 2014-03-14 LAB — LIPID PANEL
CHOL/HDL RATIO: 3.6 ratio
Cholesterol: 171 mg/dL (ref 0–200)
HDL: 48 mg/dL (ref >=46)
LDL Cholesterol: 109 mg/dL — ABNORMAL HIGH (ref 0–99)
Triglycerides: 72 mg/dL (ref <150)
VLDL: 14 mg/dL (ref 0–40)

## 2014-03-14 LAB — BASIC METABOLIC PANEL
BUN: 10 mg/dL (ref 6–23)
CHLORIDE: 98 meq/L (ref 96–112)
CO2: 28 mEq/L (ref 19–32)
Calcium: 9.4 mg/dL (ref 8.4–10.5)
Creat: 0.78 mg/dL (ref 0.50–1.10)
GLUCOSE: 118 mg/dL — AB (ref 70–99)
POTASSIUM: 3.9 meq/L (ref 3.5–5.3)
SODIUM: 138 meq/L (ref 135–145)

## 2014-03-14 LAB — HEMOGLOBIN A1C
HEMOGLOBIN A1C: 7.1 % — AB (ref <5.7)
Mean Plasma Glucose: 157 mg/dL — ABNORMAL HIGH (ref <117)

## 2014-03-28 ENCOUNTER — Telehealth: Payer: Self-pay | Admitting: Family Medicine

## 2014-03-28 NOTE — Telephone Encounter (Signed)
Patient called and states that Dr. Lorelei Pont was supposed to fax her FMLA paperwork to her employer. She states that they went over the [facility] during patient's OV on 03/13/2014 and Dr. Lorelei Pont agreed to fax forms for patient. There is no copy of FMLA scanned to patient's chart and no copy of this paperwork came to the FMLA/Disability department. Patient states that the deadline was last week and her employer has not received these forms and they are going to deny her FMLA. Please help.   [phone]

## 2014-03-29 NOTE — Telephone Encounter (Signed)
We called Nicole Hubbard this am- she has a copy of the papers but she cannot turn them in herself-we have to fax them. She is going to bring these in for [place] today.

## 2014-03-29 NOTE — Telephone Encounter (Signed)
Pt called concerned about how things are going with her FMLA paperwork. Please advise at [phone]

## 2014-03-29 NOTE — Telephone Encounter (Signed)
Dr. Lorelei Pont do you have the paperwork?

## 2014-03-30 NOTE — Telephone Encounter (Signed)
Received her papers and will fax now, will retain a copy for myself until she confirms that they are received

## 2014-03-31 ENCOUNTER — Telehealth: Payer: Self-pay | Admitting: Family Medicine

## 2014-03-31 NOTE — Telephone Encounter (Signed)
Dr. Lorelei Pont, could you please send the copy of patient's FMLA paperwork to the FMLA/Disabilities department so that we can scan it into [employer]? We like to keep copies of patients paperwork on file so that we can reference them in the future if needed. Thanks so much for taking care of these papers :-)   -[facility]

## 2014-03-31 NOTE — Telephone Encounter (Signed)
Gave copy of papers to [facility]

## 2014-04-05 ENCOUNTER — Telehealth: Payer: Self-pay | Admitting: *Deleted

## 2014-04-05 NOTE — Telephone Encounter (Signed)
Per Dr. Lorelei Pont I called patient to ask her if her employer received the FMLA paper work. I left a a message on her machine to all me back.

## 2014-04-26 ENCOUNTER — Telehealth: Payer: Self-pay

## 2014-04-26 NOTE — Telephone Encounter (Signed)
Patient is calling about FMLA paperwork. Patient states that the dates that she needs approval are 3/25-3/29 for 20 hours. She also states that the papers were received after those dates. Patient states that it's best to call after 5 and was informed there no one is working in [employer] after business hours. Please call! [phone]

## 2014-04-27 NOTE — Telephone Encounter (Signed)
Patient called back today to follow up. She states that she hasn't heard anything and would really like to receive a phone call from Dr. Lorelei Pont. Please call! [phone]

## 2014-04-27 NOTE — Telephone Encounter (Signed)
Dr. Lorelei Pont states to give whatever days pt needs. We may have to do an addendum. FMLA papers in box downstairs at [address]. Left message on pt's machine letting her know.

## 2014-04-27 NOTE — Telephone Encounter (Signed)
This paperwork was not processed through the Disability department, and no request form was ever received. Therefore, [place] and myself were not able to scan anything into the system to make amendments to. If this was sent to the scan center it will take at least a couple of weeks to be processed and show up in Patient chart. Patient is needing these forms corrected and sent back by 4/21, and states that Dr. Lorelei Pont has a copy of the paperwork with the fax number. Please advise once complete, and in the future please process through disability department. Thank you.

## 2014-06-14 ENCOUNTER — Other Ambulatory Visit: Payer: Self-pay | Admitting: Family Medicine

## 2014-06-15 ENCOUNTER — Ambulatory Visit (INDEPENDENT_AMBULATORY_CARE_PROVIDER_SITE_OTHER): Payer: 59 | Admitting: Family Medicine

## 2014-06-15 VITALS — BP 116/80 | HR 101 | Temp 98.5°F | Resp 16 | Ht 69.5 in | Wt 313.4 lb

## 2014-06-15 DIAGNOSIS — M549 Dorsalgia, unspecified: Secondary | ICD-10-CM

## 2014-06-15 DIAGNOSIS — S39012A Strain of muscle, fascia and tendon of lower back, initial encounter: Secondary | ICD-10-CM

## 2014-06-15 DIAGNOSIS — G8929 Other chronic pain: Secondary | ICD-10-CM | POA: Diagnosis not present

## 2014-06-15 DIAGNOSIS — E1165 Type 2 diabetes mellitus with hyperglycemia: Secondary | ICD-10-CM

## 2014-06-15 DIAGNOSIS — M5416 Radiculopathy, lumbar region: Secondary | ICD-10-CM

## 2014-06-15 DIAGNOSIS — E669 Obesity, unspecified: Secondary | ICD-10-CM

## 2014-06-15 DIAGNOSIS — E119 Type 2 diabetes mellitus without complications: Secondary | ICD-10-CM

## 2014-06-15 DIAGNOSIS — M5430 Sciatica, unspecified side: Secondary | ICD-10-CM

## 2014-06-15 DIAGNOSIS — E1169 Type 2 diabetes mellitus with other specified complication: Secondary | ICD-10-CM

## 2014-06-15 MED ORDER — HYDROCODONE-ACETAMINOPHEN 5-325 MG PO TABS: 1.0000 | ORAL_TABLET | Freq: Four times a day (QID) | ORAL | Status: DC | PRN

## 2014-06-15 MED ORDER — HYDROCODONE-ACETAMINOPHEN 5-325 MG PO TABS
1.0000 | ORAL_TABLET | Freq: Four times a day (QID) | ORAL | Status: DC | PRN
Start: 2014-06-15 — End: 2014-06-15

## 2014-06-15 MED ORDER — KETOROLAC TROMETHAMINE 60 MG/2ML IM SOLN
60.0000 mg | Freq: Once | INTRAMUSCULAR | Status: AC
  Administered 2014-06-15: 60 mg via INTRAMUSCULAR

## 2014-06-15 MED ORDER — CYCLOBENZAPRINE HCL 10 MG PO TABS: 10.0000 mg | ORAL_TABLET | Freq: Three times a day (TID) | ORAL | Status: DC | PRN

## 2014-06-15 MED ORDER — BLOOD GLUCOSE MONITOR KIT: PACK | Status: AC

## 2014-06-15 MED ORDER — DICLOFENAC SODIUM 75 MG PO TBEC: 75.0000 mg | DELAYED_RELEASE_TABLET | Freq: Every day | ORAL | Status: DC

## 2014-06-15 MED ORDER — PREDNISONE 10 MG PO TABS: ORAL_TABLET | ORAL | Status: DC

## 2014-06-15 MED ORDER — METFORMIN HCL 500 MG PO TABS: 1000.0000 mg | ORAL_TABLET | Freq: Two times a day (BID) | ORAL | Status: DC

## 2014-06-15 NOTE — Telephone Encounter (Signed)
Dr Lorelei Pont, I see that the pt has arrived in our office to be seen. I'll forward this req to you to address at [place] or after you have examined her.

## 2014-06-15 NOTE — Patient Instructions (Signed)
Sciatica with Rehab The sciatic nerve runs from the back down the leg and is responsible for sensation and control of the muscles in the back (posterior) side of the thigh, lower leg, and foot. Sciatica is a condition that is characterized by inflammation of this nerve.  SYMPTOMS   Signs of nerve damage, including numbness and/or weakness along the posterior side of the lower extremity.  Pain in the back of the thigh that may also travel down the leg.  Pain that worsens when sitting for long periods of time.  Occasionally, pain in the back or buttock. CAUSES  Inflammation of the sciatic nerve is the cause of sciatica. The inflammation is due to something irritating the nerve. Common sources of irritation include:  Sitting for long periods of time.  Direct trauma to the nerve.  Arthritis of the spine.  Herniated or ruptured disk.  Slipping of the vertebrae (spondylolisthesis).  Pressure from soft tissues, such as muscles or ligament-like tissue (fascia). RISK INCREASES WITH:  Sports that place pressure or stress on the spine (football or weightlifting).  Poor strength and flexibility.  Failure to warm up properly before activity.  Family history of low back pain or disk disorders.  Previous back injury or surgery.  Poor body mechanics, especially when lifting, or poor posture. PREVENTION   Warm up and stretch properly before activity.  Maintain physical fitness:  Strength, flexibility, and endurance.  Cardiovascular fitness.  Learn and use proper technique, especially with posture and lifting. When possible, have coach correct improper technique.  Avoid activities that place stress on the spine. PROGNOSIS If treated properly, then sciatica usually resolves within 6 weeks. However, occasionally surgery is necessary.  RELATED COMPLICATIONS   Permanent nerve damage, including pain, numbness, tingle, or weakness.  Chronic back pain.  Risks of surgery: infection,  bleeding, nerve damage, or damage to surrounding tissues. TREATMENT Treatment initially involves resting from any activities that aggravate your symptoms. The use of ice and medication may help reduce pain and inflammation. The use of strengthening and stretching exercises may help reduce pain with activity. These exercises may be performed at home or with referral to a therapist. A therapist may recommend further treatments, such as transcutaneous electronic nerve stimulation (TENS) or ultrasound. Your caregiver may recommend corticosteroid injections to help reduce inflammation of the sciatic nerve. If symptoms persist despite non-surgical (conservative) treatment, then surgery may be recommended. MEDICATION  If pain medication is necessary, then nonsteroidal anti-inflammatory medications, such as aspirin and ibuprofen, or other minor pain relievers, such as acetaminophen, are often recommended.  Do not take pain medication for 7 days before surgery.  Prescription pain relievers may be given if deemed necessary by your caregiver. Use only as directed and only as much as you need.  Ointments applied to the skin may be helpful.  Corticosteroid injections may be given by your caregiver. These injections should be reserved for the most serious cases, because they may only be given a certain number of times. HEAT AND COLD  Cold treatment (icing) relieves pain and reduces inflammation. Cold treatment should be applied for 10 to 15 minutes every 2 to 3 hours for inflammation and pain and immediately after any activity that aggravates your symptoms. Use ice packs or massage the area with a piece of ice (ice massage).  Heat treatment may be used prior to performing the stretching and strengthening activities prescribed by your caregiver, physical therapist, or athletic trainer. Use a heat pack or soak the injury in warm water.   SEEK MEDICAL CARE IF:  Treatment seems to offer no benefit, or the condition  worsens.  Any medications produce adverse side effects. EXERCISES  RANGE OF MOTION (ROM) AND STRETCHING EXERCISES - Sciatica Most people with sciatic will find that their symptoms worsen with either excessive bending forward (flexion) or arching at the low back (extension). The exercises which will help resolve your symptoms will focus on the opposite motion. Your physician, physical therapist or athletic trainer will help you determine which exercises will be most helpful to resolve your low back pain. Do not complete any exercises without first consulting with your clinician. Discontinue any exercises which worsen your symptoms until you speak to your clinician. If you have pain, numbness or tingling which travels down into your buttocks, leg or foot, the goal of the therapy is for these symptoms to move closer to your back and eventually resolve. Occasionally, these leg symptoms will get better, but your low back pain may worsen; this is typically an indication of progress in your rehabilitation. Be certain to be very alert to any changes in your symptoms and the activities in which you participated in the 24 hours prior to the change. Sharing this information with your clinician will allow him/her to most efficiently treat your condition. These exercises may help you when beginning to rehabilitate your injury. Your symptoms may resolve with or without further involvement from your physician, physical therapist or athletic trainer. While completing these exercises, remember:   Restoring tissue flexibility helps normal motion to return to the joints. This allows healthier, less painful movement and activity.  An effective stretch should be held for at least 30 seconds.  A stretch should never be painful. You should only feel a gentle lengthening or release in the stretched tissue. FLEXION RANGE OF MOTION AND STRETCHING EXERCISES: STRETCH - Flexion, Single Knee to Chest   Lie on a firm bed or floor  with both legs extended in front of you.  Keeping one leg in contact with the floor, bring your opposite knee to your chest. Hold your leg in place by either grabbing behind your thigh or at your knee.  Pull until you feel a gentle stretch in your low back. Hold __________ seconds.  Slowly release your grasp and repeat the exercise with the opposite side. Repeat __________ times. Complete this exercise __________ times per day.  STRETCH - Flexion, Double Knee to Chest  Lie on a firm bed or floor with both legs extended in front of you.  Keeping one leg in contact with the floor, bring your opposite knee to your chest.  Tense your stomach muscles to support your back and then lift your other knee to your chest. Hold your legs in place by either grabbing behind your thighs or at your knees.  Pull both knees toward your chest until you feel a gentle stretch in your low back. Hold __________ seconds.  Tense your stomach muscles and slowly return one leg at a time to the floor. Repeat __________ times. Complete this exercise __________ times per day.  STRETCH - Low Trunk Rotation   Lie on a firm bed or floor. Keeping your legs in front of you, bend your knees so they are both pointed toward the ceiling and your feet are flat on the floor.  Extend your arms out to the side. This will stabilize your upper body by keeping your shoulders in contact with the floor.  Gently and slowly drop both knees together to one side until   you feel a gentle stretch in your low back. Hold for __________ seconds.  Tense your stomach muscles to support your low back as you bring your knees back to the starting position. Repeat the exercise to the other side. Repeat __________ times. Complete this exercise __________ times per day  EXTENSION RANGE OF MOTION AND FLEXIBILITY EXERCISES: STRETCH - Extension, Prone on Elbows  Lie on your stomach on the floor, a bed will be too soft. Place your palms about shoulder  width apart and at the height of your head.  Place your elbows under your shoulders. If this is too painful, stack pillows under your chest.  Allow your body to relax so that your hips drop lower and make contact more completely with the floor.  Hold this position for __________ seconds.  Slowly return to lying flat on the floor. Repeat __________ times. Complete this exercise __________ times per day.  RANGE OF MOTION - Extension, Prone Press Ups  Lie on your stomach on the floor, a bed will be too soft. Place your palms about shoulder width apart and at the height of your head.  Keeping your back as relaxed as possible, slowly straighten your elbows while keeping your hips on the floor. You may adjust the placement of your hands to maximize your comfort. As you gain motion, your hands will come more underneath your shoulders.  Hold this position __________ seconds.  Slowly return to lying flat on the floor. Repeat __________ times. Complete this exercise __________ times per day.  STRENGTHENING EXERCISES - Sciatica  These exercises may help you when beginning to rehabilitate your injury. These exercises should be done near your "sweet spot." This is the neutral, low-back arch, somewhere between fully rounded and fully arched, that is your least painful position. When performed in this safe range of motion, these exercises can be used for people who have either a flexion or extension based injury. These exercises may resolve your symptoms with or without further involvement from your physician, physical therapist or athletic trainer. While completing these exercises, remember:   Muscles can gain both the endurance and the strength needed for everyday activities through controlled exercises.  Complete these exercises as instructed by your physician, physical therapist or athletic trainer. Progress with the resistance and repetition exercises only as your caregiver advises.  You may  experience muscle soreness or fatigue, but the pain or discomfort you are trying to eliminate should never worsen during these exercises. If this pain does worsen, stop and make certain you are following the directions exactly. If the pain is still present after adjustments, discontinue the exercise until you can discuss the trouble with your clinician. STRENGTHENING - Deep Abdominals, Pelvic Tilt   Lie on a firm bed or floor. Keeping your legs in front of you, bend your knees so they are both pointed toward the ceiling and your feet are flat on the floor.  Tense your lower abdominal muscles to press your low back into the floor. This motion will rotate your pelvis so that your tail bone is scooping upwards rather than pointing at your feet or into the floor.  With a gentle tension and even breathing, hold this position for __________ seconds. Repeat __________ times. Complete this exercise __________ times per day.  STRENGTHENING - Abdominals, Crunches   Lie on a firm bed or floor. Keeping your legs in front of you, bend your knees so they are both pointed toward the ceiling and your feet are flat on the   floor. Cross your arms over your chest.  Slightly tip your chin down without bending your neck.  Tense your abdominals and slowly lift your trunk high enough to just clear your shoulder blades. Lifting higher can put excessive stress on the low back and does not further strengthen your abdominal muscles.  Control your return to the starting position. Repeat __________ times. Complete this exercise __________ times per day.  STRENGTHENING - Quadruped, Opposite UE/LE Lift  Assume a hands and knees position on a firm surface. Keep your hands under your shoulders and your knees under your hips. You may place padding under your knees for comfort.  Find your neutral spine and gently tense your abdominal muscles so that you can maintain this position. Your shoulders and hips should form a rectangle  that is parallel with the floor and is not twisted.  Keeping your trunk steady, lift your right hand no higher than your shoulder and then your left leg no higher than your hip. Make sure you are not holding your breath. Hold this position __________ seconds.  Continuing to keep your abdominal muscles tense and your back steady, slowly return to your starting position. Repeat with the opposite arm and leg. Repeat __________ times. Complete this exercise __________ times per day.  STRENGTHENING - Abdominals and Quadriceps, Straight Leg Raise   Lie on a firm bed or floor with both legs extended in front of you.  Keeping one leg in contact with the floor, bend the other knee so that your foot can rest flat on the floor.  Find your neutral spine, and tense your abdominal muscles to maintain your spinal position throughout the exercise.  Slowly lift your straight leg off the floor about 6 inches for a count of 15, making sure to not hold your breath.  Still keeping your neutral spine, slowly lower your leg all the way to the floor. Repeat this exercise with each leg __________ times. Complete this exercise __________ times per day. POSTURE AND BODY MECHANICS CONSIDERATIONS - Sciatica Keeping correct posture when sitting, standing or completing your activities will reduce the stress put on different body tissues, allowing injured tissues a chance to heal and limiting painful experiences. The following are general guidelines for improved posture. Your physician or physical therapist will provide you with any instructions specific to your needs. While reading these guidelines, remember:  The exercises prescribed by your provider will help you have the flexibility and strength to maintain correct postures.  The correct posture provides the optimal environment for your joints to work. All of your joints have less wear and tear when properly supported by a spine with good posture. This means you will  experience a healthier, less painful body.  Correct posture must be practiced with all of your activities, especially prolonged sitting and standing. Correct posture is as important when doing repetitive low-stress activities (typing) as it is when doing a single heavy-load activity (lifting). RESTING POSITIONS Consider which positions are most painful for you when choosing a resting position. If you have pain with flexion-based activities (sitting, bending, stooping, squatting), choose a position that allows you to rest in a less flexed posture. You would want to avoid curling into a fetal position on your side. If your pain worsens with extension-based activities (prolonged standing, working overhead), avoid resting in an extended position such as sleeping on your stomach. Most people will find more comfort when they rest with their spine in a more neutral position, neither too rounded nor too   arched. Lying on a non-sagging bed on your side with a pillow between your knees, or on your back with a pillow under your knees will often provide some relief. Keep in mind, being in any one position for a prolonged period of time, no matter how correct your posture, can still lead to stiffness. PROPER SITTING POSTURE In order to minimize stress and discomfort on your spine, you must sit with correct posture Sitting with good posture should be effortless for a healthy body. Returning to good posture is a gradual process. Many people can work toward this most comfortably by using various supports until they have the flexibility and strength to maintain this posture on their own. When sitting with proper posture, your ears will fall over your shoulders and your shoulders will fall over your hips. You should use the back of the chair to support your upper back. Your low back will be in a neutral position, just slightly arched. You may place a small pillow or folded towel at the base of your low back for support.  When  working at a desk, create an environment that supports good, upright posture. Without extra support, muscles fatigue and lead to excessive strain on joints and other tissues. Keep these recommendations in mind: CHAIR:   A chair should be able to slide under your desk when your back makes contact with the back of the chair. This allows you to work closely.  The chair's height should allow your eyes to be level with the upper part of your monitor and your hands to be slightly lower than your elbows. BODY POSITION  Your feet should make contact with the floor. If this is not possible, use a foot rest.  Keep your ears over your shoulders. This will reduce stress on your neck and low back. INCORRECT SITTING POSTURES   If you are feeling tired and unable to assume a healthy sitting posture, do not slouch or slump. This puts excessive strain on your back tissues, causing more damage and pain. Healthier options include:  Using more support, like a lumbar pillow.  Switching tasks to something that requires you to be upright or walking.  Talking a brief walk.  Lying down to rest in a neutral-spine position. PROLONGED STANDING WHILE SLIGHTLY LEANING FORWARD  When completing a task that requires you to lean forward while standing in one place for a long time, place either foot up on a stationary 2-4 inch high object to help maintain the best posture. When both feet are on the ground, the low back tends to lose its slight inward curve. If this curve flattens (or becomes too large), then the back and your other joints will experience too much stress, fatigue more quickly and can cause pain.  CORRECT STANDING POSTURES Proper standing posture should be assumed with all daily activities, even if they only take a few moments, like when brushing your teeth. As in sitting, your ears should fall over your shoulders and your shoulders should fall over your hips. You should keep a slight tension in your abdominal  muscles to brace your spine. Your tailbone should point down to the ground, not behind your body, resulting in an over-extended swayback posture.  INCORRECT STANDING POSTURES  Common incorrect standing postures include a forward head, locked knees and/or an excessive swayback. WALKING Walk with an upright posture. Your ears, shoulders and hips should all line-up. PROLONGED ACTIVITY IN A FLEXED POSITION When completing a task that requires you to bend forward   at your waist or lean over a low surface, try to find a way to stabilize 3 of 4 of your limbs. You can place a hand or elbow on your thigh or rest a knee on the surface you are reaching across. This will provide you more stability so that your muscles do not fatigue as quickly. By keeping your knees relaxed, or slightly bent, you will also reduce stress across your low back. CORRECT LIFTING TECHNIQUES DO :   Assume a wide stance. This will provide you more stability and the opportunity to get as close as possible to the object which you are lifting.  Tense your abdominals to brace your spine; then bend at the knees and hips. Keeping your back locked in a neutral-spine position, lift using your leg muscles. Lift with your legs, keeping your back straight.  Test the weight of unknown objects before attempting to lift them.  Try to keep your elbows locked down at your sides in order get the best strength from your shoulders when carrying an object.  Always ask for help when lifting heavy or awkward objects. INCORRECT LIFTING TECHNIQUES DO NOT:   Lock your knees when lifting, even if it is a small object.  Bend and twist. Pivot at your feet or move your feet when needing to change directions.  Assume that you cannot safely pick up a paperclip without proper posture. Document Released: 12/30/2004 Document Revised: 05/16/2013 Document Reviewed: 04/13/2008 [employer] Patient Information 2015 [employer]. This information is not intended to  replace advice given to you by your health care provider. Make sure you discuss any questions you have with your health care provider.  

## 2014-06-15 NOTE — Progress Notes (Signed)
Subjective:    Patient ID: Nicole Hubbard, female    DOB: Jun 09, 1965, 49 y.o.   MRN: [id number] This chart was scribed for Delman Cheadle, MD by Zola Button, Medical Scribe. This patient was seen in Room 10 and the patient's care was started at 1:45 PM.   Chief Complaint  Patient presents with  . Back Pain    shooting down back of legs, since friday, middle lower back  . Depression    triage screening    HPI HPI Comments: Nicole Hubbard is a 49 y.o. female with a hx of obesity, DM, depression, chronic back pain, DDD and arthritis who presents to the [facility] complaining of chronic back pain that worsened 5 days ago.  She has a hx of chronic low back pain. Her PCP is my colleague Dr. Lorelei Pont. She uses as needed flexeril with relief. She has used hydrocodone in the past as well. Her back pain has been present for several years and occasional radiates down right leg. Started right after an accident at work around 2008 in her central low back. XR imaging was done 5 months ago in January, which showed mild to moderate DDD and arthritis at L5-S1.   Back Pain: Her back pain has worsened as of 5 days ago. The pain radiates down both legs. Patient also reports having some weakness, especially when standing or getting in/out of the car; she attributes this the pain. She was getting ready for work and was bending over to put on her shoes; she had some pain then which she thought would resolve but it progressively worsened when she returned from work. The pain was described as "pounding" at that time. She has been sleeping in a seated position since the back pain worsened. She has not taken any OTC medications for this. Patient denies urinary and bowel symptoms. She has not been on prednisone before.  Diabetes: She has not been measuring her CBG at home. She does have a glucometer at home, but she states it is old. She has been walking for exercise.  Past Medical History  Diagnosis Date  .  Depression   . Diabetes mellitus without complication   . Hypertension   . Chest pain   . Migraine   . Anxiety    Current Outpatient Prescriptions on File Prior to Visit  Medication Sig Dispense Refill  . amLODipine (NORVASC) 10 MG tablet Take 1 tablet (10 mg total) by mouth daily. For high blood pressure 90 tablet 2  . citalopram (CELEXA) 40 MG tablet Take 1 tablet (40 mg total) by mouth daily. For depression 90 tablet 3  . hydrochlorothiazide (HYDRODIURIL) 25 MG tablet Take 1 tablet (25 mg total) by mouth daily. For high blood pressure 90 tablet 2  . losartan (COZAAR) 100 MG tablet Take 1 tablet (100 mg total) by mouth daily. For high blood pressure 90 tablet 2  . methocarbamol (ROBAXIN) 500 MG tablet Take 1 tablet (500 mg total) by mouth every 8 (eight) hours as needed. 30 tablet 0  . lamoTRIgine (LAMICTAL) 25 MG tablet Take 2 tablets (50 mg total) by mouth daily. 30 days only. (Patient not taking: Reported on 06/15/2014) 60 tablet 0  . naproxen (NAPROSYN) 500 MG tablet Take 1 tablet (500 mg total) by mouth 2 (two) times daily. (Patient not taking: Reported on 06/15/2014) 30 tablet 0  . pravastatin (PRAVACHOL) 40 MG tablet Take 1 tablet (40 mg total) by mouth daily. (Patient not taking: Reported  on 06/15/2014) 90 tablet 3  . SUMAtriptan (IMITREX) 50 MG tablet Take 1 - 2 tablets as needed for migraine.  Max 100 mg in 24 hours (Patient not taking: Reported on 06/15/2014) 10 tablet 0   No current facility-administered medications on file prior to visit.   No Known Allergies    Review of Systems  Constitutional: Positive for activity change. Negative for fever, appetite change and unexpected weight change.  Respiratory: Negative for chest tightness.   Cardiovascular: Negative for chest pain.  Gastrointestinal: Negative.  Negative for nausea, vomiting and abdominal pain.  Endocrine: Negative for polydipsia, polyphagia and polyuria.  Genitourinary: Negative.  Negative for urgency, enuresis and  difficulty urinating.  Musculoskeletal: Positive for myalgias, back pain and arthralgias. Negative for joint swelling and gait problem.  Neurological: Negative for dizziness, seizures, syncope, speech difficulty, weakness, light-headedness and numbness.  Psychiatric/Behavioral: Positive for sleep disturbance.       Objective:  BP 116/80 mmHg  Pulse 101  Temp(Src) 98.5 F (36.9 C) (Oral)  Resp 16  Ht 5' 9.5" (1.765 m)  Wt 313 lb 6.4 oz (142.157 kg)  BMI 45.63 kg/m2  SpO2 93%  Physical Exam  Constitutional: She is oriented to person, place, and time. She appears well-developed and well-nourished. No distress.  HENT:  Head: Normocephalic and atraumatic.  Mouth/Throat: Oropharynx is clear and moist. No oropharyngeal exudate.  Eyes: Pupils are equal, round, and reactive to light.  Neck: Neck supple.  Cardiovascular: Normal rate.   Pulmonary/Chest: Effort normal.  Musculoskeletal: She exhibits tenderness. She exhibits no edema.  No lower extremity edema. Pain across mid lumbar spine and across superior aspects of both glutes and down lateral posterior thighs. Positive straight leg raise when seated bilaterally, right worse than left. Plantarflexion and dorsiflexion 5/5 bilaterally. Due to significant pain, unable to conduct resting muscle testing.  Neurological: She is alert and oriented to person, place, and time. No cranial nerve deficit.  Reflex Scores:      Achilles reflexes are 1+ on the right side and 1+ on the left side. Patellar DTRs very difficult to elicit.  Skin: Skin is warm and dry. No rash noted.  Psychiatric: She has a normal mood and affect. Her behavior is normal.  Vitals reviewed.         Assessment & Plan:  IM Toradol x1 now. Instructed to start prednisone tomorrow morning along with increasing her metformin to 1 g BID for the next week while on prednisone. If cbgs are > 250, then d/c prednisone, check bid. After pred complete, start voltaren.  RTC if cont or  worsens.  Heat and gentle stretching. 1. Lumbar strain, initial encounter   2. Lumbar radiculitis   3. Sciatica, unspecified laterality   4. Type 2 diabetes mellitus with hyperglycemia   5. Diabetes mellitus type 2 in obese   6. Chronic back pain       Meds ordered this encounter  Medications  . blood glucose meter kit and supplies KIT    Sig: Dispense based on patient and insurance preference. Use up to four times daily as directed. (FOR ICD-9 250.00, 250.01). Strips with 11 refills  Lancets with 11 refills    Dispense:  1 each    Refill:  0    Order Specific Question:  Number of strips    Answer:  100    Order Specific Question:  Number of lancets    Answer:  100  . ketorolac (TORADOL) injection 60 mg    Sig:   .  metFORMIN (GLUCOPHAGE) 500 MG tablet    Sig: Take 2 tablets (1,000 mg total) by mouth 2 (two) times daily with a meal. For diabetes management    Dispense:  360 tablet    Refill:  2  . predniSONE (DELTASONE) 10 MG tablet    Sig: 6-5-4-3-2-1 tabs po qd    Dispense:  21 tablet    Refill:  0  . diclofenac (VOLTAREN) 75 MG EC tablet    Sig: Take 1 tablet (75 mg total) by mouth daily. And up to q8 hrs prn pain    Dispense:  60 tablet    Refill:  0  . cyclobenzaprine (FLEXERIL) 10 MG tablet    Sig: Take 1 tablet (10 mg total) by mouth 3 (three) times daily as needed for muscle spasms.    Dispense:  30 tablet    Refill:  1  . DISCONTD: HYDROcodone-acetaminophen (NORCO/VICODIN) 5-325 MG per tablet    Sig: Take 1 tablet by mouth every 6 (six) hours as needed for moderate pain.    Dispense:  30 tablet    Refill:  0  . HYDROcodone-acetaminophen (NORCO/VICODIN) 5-325 MG per tablet    Sig: Take 1 tablet by mouth every 6 (six) hours as needed for moderate pain.    Dispense:  30 tablet    Refill:  0    I personally performed the services described in this documentation, which was scribed in my presence. The recorded information has been reviewed and considered, and  addended by me as needed.  Delman Cheadle, MD MPH

## 2014-08-25 ENCOUNTER — Other Ambulatory Visit: Payer: Self-pay | Admitting: Family Medicine

## 2014-09-11 ENCOUNTER — Ambulatory Visit: Payer: 59 | Admitting: Family Medicine

## 2014-10-04 ENCOUNTER — Ambulatory Visit (INDEPENDENT_AMBULATORY_CARE_PROVIDER_SITE_OTHER): Payer: 59 | Admitting: Family Medicine

## 2014-10-04 ENCOUNTER — Encounter: Payer: Self-pay | Admitting: Family Medicine

## 2014-10-04 VITALS — BP 124/94 | HR 91 | Temp 98.2°F | Resp 16 | Ht 68.0 in | Wt 318.8 lb

## 2014-10-04 DIAGNOSIS — G8929 Other chronic pain: Secondary | ICD-10-CM | POA: Diagnosis not present

## 2014-10-04 DIAGNOSIS — M5441 Lumbago with sciatica, right side: Secondary | ICD-10-CM | POA: Diagnosis not present

## 2014-10-04 DIAGNOSIS — G4700 Insomnia, unspecified: Secondary | ICD-10-CM

## 2014-10-04 DIAGNOSIS — Z130 Encounter for screening for diseases of the blood and blood-forming organs and certain disorders involving the immune mechanism: Secondary | ICD-10-CM

## 2014-10-04 DIAGNOSIS — Z23 Encounter for immunization: Secondary | ICD-10-CM | POA: Diagnosis not present

## 2014-10-04 DIAGNOSIS — E119 Type 2 diabetes mellitus without complications: Secondary | ICD-10-CM

## 2014-10-04 DIAGNOSIS — M549 Dorsalgia, unspecified: Secondary | ICD-10-CM | POA: Diagnosis not present

## 2014-10-04 LAB — COMPREHENSIVE METABOLIC PANEL
ALK PHOS: 63 U/L (ref 33–130)
ALT: 10 U/L (ref 6–29)
AST: 12 U/L (ref 10–35)
Albumin: 4.3 g/dL (ref 3.6–5.1)
BUN: 11 mg/dL (ref 7–25)
CALCIUM: 9.6 mg/dL (ref 8.6–10.4)
CHLORIDE: 100 mmol/L (ref 98–110)
CO2: 27 mmol/L (ref 20–31)
Creat: 0.74 mg/dL (ref 0.50–1.05)
GLUCOSE: 111 mg/dL — AB (ref 65–99)
Potassium: 4.1 mmol/L (ref 3.5–5.3)
Sodium: 136 mmol/L (ref 135–146)
Total Bilirubin: 0.5 mg/dL (ref 0.2–1.2)
Total Protein: 7.9 g/dL (ref 6.1–8.1)

## 2014-10-04 LAB — CBC
HEMATOCRIT: 40.8 % (ref 36.0–46.0)
Hemoglobin: 13.5 g/dL (ref 12.0–15.0)
MCH: 28 pg (ref 26.0–34.0)
MCHC: 33.1 g/dL (ref 30.0–36.0)
MCV: 84.6 fL (ref 78.0–100.0)
MPV: 11.1 fL (ref 8.6–12.4)
PLATELETS: 321 10*3/uL (ref 150–400)
RBC: 4.82 MIL/uL (ref 3.87–5.11)
RDW: 14.8 % (ref 11.5–15.5)
WBC: 7.5 10*3/uL (ref 4.0–10.5)

## 2014-10-04 MED ORDER — CYCLOBENZAPRINE HCL 10 MG PO TABS: 10.0000 mg | ORAL_TABLET | Freq: Three times a day (TID) | ORAL | Status: DC | PRN

## 2014-10-04 MED ORDER — ZOLPIDEM TARTRATE 10 MG PO TABS: 5.0000 mg | ORAL_TABLET | Freq: Every evening | ORAL | Status: AC | PRN

## 2014-10-04 MED ORDER — HYDROCODONE-ACETAMINOPHEN 5-325 MG PO TABS: 1.0000 | ORAL_TABLET | Freq: Four times a day (QID) | ORAL | Status: DC | PRN

## 2014-10-04 NOTE — Progress Notes (Signed)
[facility] [address], [place] [address] [phone]  Date:  10/04/2014   Name:  ROKHAYA QUINN   DOB:  15-Mar-1965   MRN:  [id number]  PCP:  Lamar Blinks, MD    Chief Complaint: Follow-up and Diabetes   History of Present Illness:  CHRISTA FASIG is a 49 y.o. very pleasant female patient who presents with the following:  Here today for a recheck of her DM, HTN, obesity Her most recent A1c was reasonable- as below She notes that she has a hard time sleeping.  She needs a refill of her [place].  She uses this periodically She was here in June with back pain- treated by Dr. Brigitte Pulse with prednisone, flexeril, hydrocodone.  She was also seen for this back in January of this year.  The pain is in her lower back, down both legs She does not have any numbness or tingling in her legs. No bowel or bladder incont  She took prednisone which did help her for a while at least However her pain then came back She would like to lose more weight, but cannot exercise with her back pain  Last mammo about one year ago- [facility]. We do not have these records  Only controlled substance entry is hydrocodone from Dr. Brigitte Pulse in June  She continues to see her counselor for depression and is also in a support group.  She uses ambien as needed for insomnia.  She has tried trazodone but it did not work as well.   She is still on celexa  Wt Readings from Last 3 Encounters:  10/04/14 318 lb 12.8 oz (144.607 kg)  06/15/14 313 lb 6.4 oz (142.157 kg)  03/13/14 308 lb 3.2 oz (139.799 kg)    Lab Results  Component Value Date   HGBA1C 7.1* 03/13/2014    Patient Active Problem List   Diagnosis Date Noted  . MDD (major depressive disorder), recurrent severe, without psychosis 08/17/2013  . Morbid obesity 06/30/2012  . Depression 01/26/2012  . Diabetes mellitus, type 2 01/26/2012  . HTN (hypertension) 01/26/2012    Past Medical History  Diagnosis Date  . Depression   . Diabetes  mellitus without complication   . Hypertension   . Chest pain   . Migraine   . Anxiety     Past Surgical History  Procedure Laterality Date  . Knee surgery    . Tonsillectomy      Social History  Substance Use Topics  . Smoking status: Former Smoker    Types: Cigarettes    Quit date: 09/15/2008  . Smokeless tobacco: Never Used  . Alcohol Use: 1.2 oz/week    2 Glasses of wine per week    Family History  Problem Relation Age of Onset  . Cancer Father     urithrial    No Known Allergies  Medication list has been reviewed and updated.  Current Outpatient Prescriptions on File Prior to Visit  Medication Sig Dispense Refill  . amLODipine (NORVASC) 10 MG tablet Take 1 tablet (10 mg total) by mouth daily. For high blood pressure 90 tablet 2  . blood glucose meter kit and supplies KIT Dispense based on patient and insurance preference. Use up to four times daily as directed. (FOR ICD-9 250.00, 250.01). Strips with 11 refills  Lancets with 11 refills 1 each 0  . citalopram (CELEXA) 40 MG tablet Take 1 tablet (40 mg total) by mouth daily. For depression 90 tablet 3  . cyclobenzaprine (  FLEXERIL) 10 MG tablet Take 1 tablet (10 mg total) by mouth 3 (three) times daily as needed for muscle spasms. 30 tablet 1  . diclofenac (VOLTAREN) 75 MG EC tablet Take 1 tablet (75 mg total) by mouth daily. And up to q8 hrs prn pain 60 tablet 0  . hydrochlorothiazide (HYDRODIURIL) 25 MG tablet TAKE 1 TABLET (25 MG TOTAL) BY MOUTH DAILY FOR HIGH BLOOD PRESSURE 90 tablet 0  . HYDROcodone-acetaminophen (NORCO/VICODIN) 5-325 MG per tablet Take 1 tablet by mouth every 6 (six) hours as needed for moderate pain. 30 tablet 0  . lamoTRIgine (LAMICTAL) 25 MG tablet Take 2 tablets (50 mg total) by mouth daily. 30 days only. (Patient not taking: Reported on 06/15/2014) 60 tablet 0  . losartan (COZAAR) 100 MG tablet TAKE 1 TABLET (100 MG TOTAL) BY MOUTH DAILY FOR HIGH BLOOD PRESSURE 30 tablet 0  . metFORMIN  (GLUCOPHAGE) 500 MG tablet TAKE 1 TABLET (500 MG TOTAL) BY MOUTH 2 (TWO) TIMES DAILY WITH A MEAL FOR DIABETES MANAGEMENT 180 tablet 0  . methocarbamol (ROBAXIN) 500 MG tablet Take 1 tablet (500 mg total) by mouth every 8 (eight) hours as needed. 30 tablet 0  . naproxen (NAPROSYN) 500 MG tablet Take 1 tablet (500 mg total) by mouth 2 (two) times daily. (Patient not taking: Reported on 06/15/2014) 30 tablet 0  . pravastatin (PRAVACHOL) 40 MG tablet Take 1 tablet (40 mg total) by mouth daily. (Patient not taking: Reported on 06/15/2014) 90 tablet 3  . predniSONE (DELTASONE) 10 MG tablet 6-5-4-3-2-1 tabs po qd 21 tablet 0  . SUMAtriptan (IMITREX) 50 MG tablet Take 1 - 2 tablets as needed for migraine.  Max 100 mg in 24 hours (Patient not taking: Reported on 06/15/2014) 10 tablet 0   No current facility-administered medications on file prior to visit.    Review of Systems:  As per HPI- otherwise negative. No bowel or bladder dysfunction noted  Physical Examination: Filed Vitals:   10/04/14 1528  BP: 124/94  Pulse: 91  Temp: 98.2 F (36.8 C)  Resp: 16   Filed Vitals:   10/04/14 1528  Height: 5' 8" (1.727 m)  Weight: 318 lb 12.8 oz (144.607 kg)   Body mass index is 48.48 kg/(m^2). Ideal Body Weight: Weight in (lb) to have BMI = 25: 164.1  GEN: WDWN, NAD, Non-toxic, A & O x 3, obese, looks well HEENT: Atraumatic, Normocephalic. Neck supple. No masses, No LAD. Ears and Nose: No external deformity. CV: RRR, No M/G/R. No JVD. No thrill. No extra heart sounds. PULM: CTA B, no wheezes, crackles, rhonchi. No retractions. No resp. distress. No accessory muscle use. EXTR: No c/c/e NEURO Normal gait.  PSYCH: Normally interactive. Conversant. Not depressed or anxious appearing.  Calm demeanor.  Normal strrength, sensation and DTR both LE, but she does notice a positive SLR on the right She indicates tenderness in her mid lower back to palpation  Assessment and Plan: Diabetes mellitus type 2,  controlled - Plan: Comprehensive metabolic panel, Hemoglobin A1c  Flu vaccine need - Plan: Flu Vaccine QUAD 36+ mos IM  Bilateral low back pain with right-sided sciatica - Plan: MR Lumbar Spine Wo Contrast  Screening for deficiency anemia - Plan: CBC  Chronic back pain - Plan: cyclobenzaprine (FLEXERIL) 10 MG tablet, HYDROcodone-acetaminophen (NORCO/VICODIN) 5-325 MG per tablet  Insomnia - Plan: zolpidem (AMBIEN) 10 MG tablet  Will arrange MRI for her due to persistent back pain with radiation down leg Refilled medications for back pain Refilled ambien-  cautioned regarding sedating and habit forming medications   Signed Lamar Blinks, MD

## 2014-10-04 NOTE — Patient Instructions (Addendum)
I will be in touch with your labs asap Keep up the good work with your diet Use the ambien as needed for sleep- however remember that this is a habit forming medication and should be used only periodically You can use the pain medication (hydrocodone) and muscle relaxer for your back, but remember that these medications can also be habit forming and cause sedation Do not mix ambien with any other sedating medication or with alcohol We will try to get an MRI arranged for your back- we will be in touch about this soon! Now that you are 49 it is time to think about a colonoscopy; this can be done by the GI doctor of your choice   Some good options in town are [facility] or [facility]

## 2014-10-05 LAB — HEMOGLOBIN A1C
Hgb A1c MFr Bld: 8.9 % — ABNORMAL HIGH (ref <5.7)
Mean Plasma Glucose: 209 mg/dL — ABNORMAL HIGH (ref <117)

## 2014-10-09 ENCOUNTER — Telehealth: Payer: Self-pay

## 2014-10-09 NOTE — Telephone Encounter (Signed)
Dr Copland? 

## 2014-10-09 NOTE — Telephone Encounter (Signed)
Pt states she received a call regarding having insulin rx for and she is calling back to confirm that she wants to go ahead with it   Best number [phone]

## 2014-10-10 ENCOUNTER — Ambulatory Visit
Admission: RE | Admit: 2014-10-10 | Discharge: 2014-10-10 | Disposition: A | Discharge status: Home / Self Care | Payer: 59 | Source: Ambulatory Visit | Attending: Family Medicine | Admitting: Family Medicine

## 2014-10-10 DIAGNOSIS — M5441 Lumbago with sciatica, right side: Secondary | ICD-10-CM

## 2014-10-10 NOTE — Telephone Encounter (Signed)
Notes Recorded by Darreld Mclean, MD on 10/08/2014 at 5:52 PM Called and [facility]. Would she consider an injectable medication- specifically victoza? Please read about this and I will try back another day  Can someone review/

## 2014-10-10 NOTE — Telephone Encounter (Signed)
Called and LMOM- do not worry, not an emergency.  Will try her back to discuss soon

## 2014-10-10 NOTE — Telephone Encounter (Signed)
Patient called to inquire about the medication..  It is critical to start taking the medication now, or can she wait until Dr. Lorelei Pont has a chance to respond.  [phone]

## 2014-10-13 ENCOUNTER — Encounter: Payer: Self-pay | Admitting: Family Medicine

## 2014-10-13 ENCOUNTER — Ambulatory Visit (INDEPENDENT_AMBULATORY_CARE_PROVIDER_SITE_OTHER): Payer: 59 | Admitting: Family Medicine

## 2014-10-13 VITALS — BP 132/84 | HR 102 | Temp 98.3°F | Resp 18 | Ht 69.0 in | Wt 319.0 lb

## 2014-10-13 DIAGNOSIS — E118 Type 2 diabetes mellitus with unspecified complications: Secondary | ICD-10-CM

## 2014-10-13 DIAGNOSIS — IMO0002 Reserved for concepts with insufficient information to code with codable children: Secondary | ICD-10-CM

## 2014-10-13 DIAGNOSIS — M5442 Lumbago with sciatica, left side: Secondary | ICD-10-CM | POA: Diagnosis not present

## 2014-10-13 DIAGNOSIS — E1165 Type 2 diabetes mellitus with hyperglycemia: Secondary | ICD-10-CM

## 2014-10-13 MED ORDER — OXYCODONE-ACETAMINOPHEN 5-325 MG PO TABS: 1.0000 | ORAL_TABLET | Freq: Three times a day (TID) | ORAL | Status: DC | PRN

## 2014-10-13 MED ORDER — DULAGLUTIDE 0.75 MG/0.5ML ~~LOC~~ SOAJ: SUBCUTANEOUS | Status: DC

## 2014-10-13 NOTE — Progress Notes (Signed)
[facility] [address], [place] [address] [phone]  Date:  10/13/2014   Name:  Nicole Hubbard   DOB:  March 14, 1965   MRN:  [id number]  PCP:  Lamar Blinks, MD    Chief Complaint: Medication Management   History of Present Illness:  Nicole Hubbard is a 49 y.o. very pleasant female patient who presents with the following:  She was here 10 days ago for recheck of her DM and back pain. She underwent an MRI 3 days ago.  We had a hard time connecting on the phone to go over recent labs and MRI so she just decided to come in.  She does not have any acute change in her sx however.  MRI showed the following:  IMPRESSION: L5-S1 would appear to be the symptomatic level. There is a central extrusion, endplate reactive changes, posterior element hypertrophy, and slight disc space narrowing. BILATERAL foraminal narrowing appears most likely to be symptomatic, worse on the LEFT.  She is taking hydrocodone before bedwhich helps her "somewhat."  She is better is she props herself to a sitting position to sleep.  She uses ambien on days that she does not use the hydrocodone, but knows not to use both together.    She would like to see NSG regarding her back- would be interested in a steroid injection possibly  Also noted that her A1c has increased  Lab Results  Component Value Date   HGBA1C 8.9* 10/04/2014   She is on metformin and really struggling with her weight.  Would be interested in trying an injectable GLP-1 agonist.  We had discussed victoza but I have a savings coupon for trulicity so she would like to use that instead   Patient Active Problem List   Diagnosis Date Noted  . MDD (major depressive disorder), recurrent severe, without psychosis 08/17/2013  . Morbid obesity 06/30/2012  . Depression 01/26/2012  . Diabetes mellitus, type 2 01/26/2012  . HTN (hypertension) 01/26/2012    Past Medical History  Diagnosis Date  . Depression   . Diabetes  mellitus without complication   . Hypertension   . Chest pain   . Migraine   . Anxiety     Past Surgical History  Procedure Laterality Date  . Knee surgery    . Tonsillectomy      Social History  Substance Use Topics  . Smoking status: Former Smoker    Types: Cigarettes    Quit date: 09/15/2008  . Smokeless tobacco: Never Used  . Alcohol Use: 1.2 oz/week    2 Glasses of wine per week    Family History  Problem Relation Age of Onset  . Cancer Father     urithrial    No Known Allergies  Medication list has been reviewed and updated.  Current Outpatient Prescriptions on File Prior to Visit  Medication Sig Dispense Refill  . amLODipine (NORVASC) 10 MG tablet Take 1 tablet (10 mg total) by mouth daily. For high blood pressure 90 tablet 2  . blood glucose meter kit and supplies KIT Dispense based on patient and insurance preference. Use up to four times daily as directed. (FOR ICD-9 250.00, 250.01). Strips with 11 refills  Lancets with 11 refills 1 each 0  . citalopram (CELEXA) 40 MG tablet Take 1 tablet (40 mg total) by mouth daily. For depression 90 tablet 3  . cyclobenzaprine (FLEXERIL) 10 MG tablet Take 1 tablet (10 mg total) by mouth 3 (three) times daily as  needed for muscle spasms. 30 tablet 1  . hydrochlorothiazide (HYDRODIURIL) 25 MG tablet TAKE 1 TABLET (25 MG TOTAL) BY MOUTH DAILY FOR HIGH BLOOD PRESSURE 90 tablet 0  . HYDROcodone-acetaminophen (NORCO/VICODIN) 5-325 MG per tablet Take 1 tablet by mouth every 6 (six) hours as needed for moderate pain. 30 tablet 0  . losartan (COZAAR) 100 MG tablet TAKE 1 TABLET (100 MG TOTAL) BY MOUTH DAILY FOR HIGH BLOOD PRESSURE 30 tablet 0  . metFORMIN (GLUCOPHAGE) 500 MG tablet TAKE 1 TABLET (500 MG TOTAL) BY MOUTH 2 (TWO) TIMES DAILY WITH A MEAL FOR DIABETES MANAGEMENT 180 tablet 0  . pravastatin (PRAVACHOL) 40 MG tablet Take 1 tablet (40 mg total) by mouth daily. 90 tablet 3  . SUMAtriptan (IMITREX) 50 MG tablet Take 1 - 2  tablets as needed for migraine.  Max 100 mg in 24 hours 10 tablet 0  . zolpidem (AMBIEN) 10 MG tablet Take 0.5 tablets (5 mg total) by mouth at bedtime as needed for sleep. 15 tablet 1   No current facility-administered medications on file prior to visit.    Review of Systems:  As per HPI- otherwise negative.   Physical Examination: Filed Vitals:   10/13/14 1721  BP: 132/84  Pulse: 102  Temp: 98.3 F (36.8 C)  Resp: 18   Filed Vitals:   10/13/14 1721  Height: 5' 9" (1.753 m)  Weight: 319 lb (144.697 kg)   Body mass index is 47.09 kg/(m^2). Ideal Body Weight: Weight in (lb) to have BMI = 25: 168.9  GEN: WDWN, NAD, Non-toxic, A & O x 3, obese, looks well HEENT: Atraumatic, Normocephalic. Neck supple. No masses, No LAD. Ears and Nose: No external deformity. CV: RRR, No M/G/R. No JVD. No thrill. No extra heart sounds. PULM: CTA B, no wheezes, crackles, rhonchi. No retractions. No resp. distress. No accessory muscle use. EXTR: No c/c/e NEURO Normal gait.  PSYCH: Normally interactive. Conversant. Not depressed or anxious appearing.  Calm demeanor.   Wt Readings from Last 3 Encounters:  10/13/14 319 lb (144.697 kg)  10/10/14 318 lb (144.244 kg)  10/04/14 318 lb 12.8 oz (144.607 kg)    Assessment and Plan: Left-sided low back pain with left-sided sciatica - Plan: oxyCODONE-acetaminophen (ROXICET) 5-325 MG tablet, Ambulatory referral to Neurosurgery  Diabetes mellitus type 2, uncontrolled, with complications - Plan: Dulaglutide (TRULICITY) 2.35 TD/3.2KG SOPN  Nicole Hubbard has a bulging lumbar disc.  Pain is keeping her awake at night.  Gave her a supply of oxycodone to use instead of hydrocodone but instructed her NOT to use this with ambien, not to take both pain medications  Will start trulicity- gave her savings card Will plan to recheck in 3 months   Meds ordered this encounter  Medications  . oxyCODONE-acetaminophen (ROXICET) 5-325 MG tablet    Sig: Take 1 tablet by mouth  every 8 (eight) hours as needed for severe pain.    Dispense:  30 tablet    Refill:  0  . Dulaglutide (TRULICITY) 2.54 YH/0.6CB SOPN    Sig: Inject one dose [place] weekly    Dispense:  4 pen    Refill:  74  '  Signed Lamar Blinks, MD

## 2014-10-13 NOTE — Patient Instructions (Signed)
Let's add trulicity to your diabetes regimen- you do one injection once a week.   The pre-filled pen is easy to use, but we are glad to do your first shot for you if your prefer I gave you a stronger pain medication (percoet) to use as needed for pain. Do not use this and hydrocodone- use one of the other  We will refer you to neurosurgery regarding your back- you do have a bulging disc and may need a steroid injection Let me know if you have any questions or concerns- let's recheck your diabetes in 3 months

## 2014-10-29 ENCOUNTER — Other Ambulatory Visit: Payer: Self-pay | Admitting: Family Medicine

## 2014-11-01 ENCOUNTER — Other Ambulatory Visit: Payer: Self-pay

## 2014-11-01 MED ORDER — AMLODIPINE BESYLATE 10 MG PO TABS: ORAL_TABLET | ORAL | Status: DC

## 2014-11-21 ENCOUNTER — Other Ambulatory Visit: Payer: Self-pay | Admitting: Physician Assistant

## 2014-11-21 ENCOUNTER — Other Ambulatory Visit: Payer: Self-pay | Admitting: Family Medicine

## 2014-12-08 ENCOUNTER — Other Ambulatory Visit: Payer: Self-pay

## 2014-12-08 MED ORDER — DULAGLUTIDE 0.75 MG/0.5ML ~~LOC~~ SOAJ: SUBCUTANEOUS | Status: DC

## 2014-12-11 ENCOUNTER — Encounter: Payer: Self-pay | Admitting: Family Medicine

## 2014-12-11 DIAGNOSIS — M549 Dorsalgia, unspecified: Principal | ICD-10-CM

## 2014-12-11 DIAGNOSIS — G8929 Other chronic pain: Secondary | ICD-10-CM

## 2014-12-12 MED ORDER — CYCLOBENZAPRINE HCL 10 MG PO TABS: 10.0000 mg | ORAL_TABLET | Freq: Three times a day (TID) | ORAL | Status: DC | PRN

## 2015-01-03 ENCOUNTER — Telehealth: Payer: Self-pay | Admitting: Family Medicine

## 2015-01-14 HISTORY — PX: BACK SURGERY: SHX140

## 2015-01-16 NOTE — Telephone Encounter (Signed)
ERROR

## 2015-01-24 ENCOUNTER — Encounter: Payer: Self-pay | Admitting: Family Medicine

## 2015-02-02 ENCOUNTER — Encounter: Payer: Self-pay | Admitting: Family Medicine

## 2015-02-07 ENCOUNTER — Encounter: Payer: Self-pay | Admitting: Family Medicine

## 2015-02-14 ENCOUNTER — Ambulatory Visit: Payer: 59 | Admitting: Family Medicine

## 2015-02-21 ENCOUNTER — Other Ambulatory Visit (HOSPITAL_COMMUNITY): Payer: Self-pay | Admitting: Neurosurgery

## 2015-02-24 ENCOUNTER — Other Ambulatory Visit: Payer: Self-pay | Admitting: Family Medicine

## 2015-03-13 ENCOUNTER — Other Ambulatory Visit: Payer: Self-pay | Admitting: Family Medicine

## 2015-03-13 ENCOUNTER — Other Ambulatory Visit: Payer: Self-pay | Admitting: Physician Assistant

## 2015-03-13 NOTE — Pre-Procedure Instructions (Signed)
Nicole Hubbard  03/13/2015     Your procedure is scheduled on : Monday March 19, 2015 at 1:40 PM.  Report to [facility] Admitting at 11:35 AM.  Call this number if you have problems the morning of surgery: [phone]    Remember:  Do not eat food or drink liquids after midnight.  Take these medicines the morning of surgery with A SIP OF WATER : Amlodipine (Norvasc), Citalopram (Celexa), Tramadol (Ultram) if needed   Stop taking any vitamins, herbal medications/supplements, Ibuprofen, Advil, Motrin, Aleve, etc today    Do NOT take any diabetic pills the morning of your surgery (NO Metformin/Glucophage)   How to Manage Your Diabetes Before Surgery   Why is it important to control my blood sugar before and after surgery?   Improving blood sugar levels before and after surgery helps healing and can limit problems.  A way of improving blood sugar control is eating a healthy diet by:  - Eating less sugar and carbohydrates  - Increasing activity/exercise  - Talk with your doctor about reaching your blood sugar goals  High blood sugars (greater than 180 mg/dL) can raise your risk of infections and slow down your recovery so you will need to focus on controlling your diabetes during the weeks before surgery.  Make sure that the doctor who takes care of your diabetes knows about your planned surgery including the date and location.  How do I manage my blood sugars before surgery?   Check your blood sugar at least 4 times a day, 2 days before surgery to make sure that they are not too high or low.   Check your blood sugar the morning of your surgery when you wake up and every 2 hours until you get to the Short-Stay unit.  If your blood sugar is less than 70 mg/dL, you will need to treat for low blood sugar by:  Treat a low blood sugar (less than 70 mg/dL) with 1/2 cup of clear juice (cranberry or apple), 4 glucose tablets, OR glucose gel.  Recheck blood sugar in 15 minutes  after treatment (to make sure it is greater than 70 mg/dL).  If blood sugar is not greater than 70 mg/dL on re-check, call [phone] for further instructions.   Report your blood sugar to the Short-Stay nurse when you get to Short-Stay.  References:  [facility], 2007 "How to Manage your Diabetes Before and After Surgery".  What do I do about my diabetes medications?   Do not take oral diabetes medicines (pills) the morning of surgery.   Do not take other diabetes injectables the day of surgery including Trulicity.    Do not wear jewelry, make-up or nail polish.  Do not wear lotions, powders, or perfumes.    Do not shave 48 hours prior to surgery.    Do not bring valuables to the hospital.  [facility] is not responsible for any belongings or valuables.  Contacts, dentures or bridgework may not be worn into surgery.  Leave your suitcase in the car.  After surgery it may be brought to your room.  For patients admitted to the hospital, discharge time will be determined by your treatment team.  Patients discharged the day of surgery will not be allowed to drive home.   Name and phone number of your driver:    Special instructions:  Shower using CHG soap the night before and the morning of your surgery  Please read over the  following fact sheets that you were given. Pain Booklet, Coughing and Deep Breathing, Blood Transfusion Information, MRSA Information and Surgical Site Infection Prevention

## 2015-03-14 ENCOUNTER — Encounter (HOSPITAL_COMMUNITY): Payer: Self-pay

## 2015-03-14 ENCOUNTER — Other Ambulatory Visit: Payer: Self-pay

## 2015-03-14 ENCOUNTER — Encounter (HOSPITAL_COMMUNITY)
Admission: RE | Admit: 2015-03-14 | Discharge: 2015-03-14 | Disposition: A | Discharge status: Home / Self Care | Payer: 59 | Source: Ambulatory Visit | Attending: Neurosurgery | Admitting: Neurosurgery

## 2015-03-14 DIAGNOSIS — Z01812 Encounter for preprocedural laboratory examination: Secondary | ICD-10-CM | POA: Insufficient documentation

## 2015-03-14 DIAGNOSIS — M5127 Other intervertebral disc displacement, lumbosacral region: Secondary | ICD-10-CM | POA: Insufficient documentation

## 2015-03-14 DIAGNOSIS — Z01818 Encounter for other preprocedural examination: Secondary | ICD-10-CM | POA: Insufficient documentation

## 2015-03-14 DIAGNOSIS — Z7984 Long term (current) use of oral hypoglycemic drugs: Secondary | ICD-10-CM | POA: Insufficient documentation

## 2015-03-14 DIAGNOSIS — I10 Essential (primary) hypertension: Secondary | ICD-10-CM | POA: Insufficient documentation

## 2015-03-14 DIAGNOSIS — Z87891 Personal history of nicotine dependence: Secondary | ICD-10-CM | POA: Insufficient documentation

## 2015-03-14 DIAGNOSIS — E119 Type 2 diabetes mellitus without complications: Secondary | ICD-10-CM | POA: Insufficient documentation

## 2015-03-14 DIAGNOSIS — G4733 Obstructive sleep apnea (adult) (pediatric): Secondary | ICD-10-CM | POA: Insufficient documentation

## 2015-03-14 DIAGNOSIS — Z0183 Encounter for blood typing: Secondary | ICD-10-CM | POA: Diagnosis not present

## 2015-03-14 DIAGNOSIS — Z79899 Other long term (current) drug therapy: Secondary | ICD-10-CM | POA: Insufficient documentation

## 2015-03-14 DIAGNOSIS — Z9289 Personal history of other medical treatment: Secondary | ICD-10-CM

## 2015-03-14 HISTORY — DX: Sleep apnea, unspecified: G47.30

## 2015-03-14 HISTORY — DX: Adverse effect of unspecified anesthetic, initial encounter: T41.45XA

## 2015-03-14 HISTORY — DX: Other complications of anesthesia, initial encounter: T88.59XA

## 2015-03-14 HISTORY — DX: Personal history of other medical treatment: Z92.89

## 2015-03-14 LAB — CBC WITH DIFFERENTIAL/PLATELET
BASOS ABS: 0 10*3/uL (ref 0.0–0.1)
BASOS PCT: 0 %
EOS ABS: 0.2 10*3/uL (ref 0.0–0.7)
Eosinophils Relative: 2 %
HEMATOCRIT: 41.8 % (ref 36.0–46.0)
HEMOGLOBIN: 13.9 g/dL (ref 12.0–15.0)
Lymphocytes Relative: 48 %
Lymphs Abs: 3.3 10*3/uL (ref 0.7–4.0)
MCH: 28.7 pg (ref 26.0–34.0)
MCHC: 33.3 g/dL (ref 30.0–36.0)
MCV: 86.4 fL (ref 78.0–100.0)
Monocytes Absolute: 0.4 10*3/uL (ref 0.1–1.0)
Monocytes Relative: 5 %
NEUTROS ABS: 3.2 10*3/uL (ref 1.7–7.7)
NEUTROS PCT: 45 %
Platelets: 261 10*3/uL (ref 150–400)
RBC: 4.84 MIL/uL (ref 3.87–5.11)
RDW: 13.7 % (ref 11.5–15.5)
WBC: 7.1 10*3/uL (ref 4.0–10.5)

## 2015-03-14 LAB — BASIC METABOLIC PANEL
ANION GAP: 14 (ref 5–15)
BUN: 6 mg/dL (ref 6–20)
CALCIUM: 9.8 mg/dL (ref 8.9–10.3)
CO2: 25 mmol/L (ref 22–32)
Chloride: 102 mmol/L (ref 101–111)
Creatinine, Ser: 0.82 mg/dL (ref 0.44–1.00)
GFR calc non Af Amer: >60 mL/min (ref >60)
Glucose, Bld: 155 mg/dL — ABNORMAL HIGH (ref 65–99)
Potassium: 3.6 mmol/L (ref 3.5–5.1)
SODIUM: 141 mmol/L (ref 135–145)

## 2015-03-14 LAB — SURGICAL PCR SCREEN
MRSA, PCR: NEGATIVE
STAPHYLOCOCCUS AUREUS: NEGATIVE

## 2015-03-14 LAB — GLUCOSE, CAPILLARY: Glucose-Capillary: 142 mg/dL — ABNORMAL HIGH (ref 65–99)

## 2015-03-14 NOTE — Progress Notes (Signed)
PCP is Janett Billow Copland  Patient denied having any acute cardiac or pulmonary issues  CBG on arrival to PAT was 142. Patient stated she has not consumed any food today. According to patient her blood glucose ranges between 150-134. Patient was unaware of what her last A1c level was.  Patient arrived to PAT with her daughter [place].

## 2015-03-14 NOTE — Progress Notes (Signed)
Debbie, RN informed Nurse that staff from blood bank called her and informed her that patient needed to have another type and screen drawn DOS due to antibodies being present in blood. Will order type and screen STAT DOS.

## 2015-03-15 LAB — HEMOGLOBIN A1C
Hgb A1c MFr Bld: 9.3 % — ABNORMAL HIGH (ref 4.8–5.6)
MEAN PLASMA GLUCOSE: 220 mg/dL

## 2015-03-15 NOTE — Progress Notes (Signed)
Anesthesia Chart Review:  Pt is a 50 year old female scheduled for L5-S1 PLIF on 03/19/2015 with Dr. Annette Stable.   PMH includes:  HTN, DM, OSA (no CPAP). Former smoker. BMI 47.   Medications include: amlodipine, hctz, losartan, metformin, pravastatin.   Preoperative labs reviewed.  HgbA1c 9.3, glucose 155.   EKG 03/14/15: NSR. Cannot rule out Anterior infarct, age undetermined.  No CV sx were documented at PAT.   If no changes, I anticipate pt can proceed with surgery as scheduled.   Willeen Cass, FNP-BC [facility]/Anesthesiology Phone: [phone] 03/15/2015 4:41 PM

## 2015-03-18 MED ORDER — DEXTROSE 5 % IV SOLN
3.0000 g | INTRAVENOUS | Status: AC
  Administered 2015-03-19: 3 g via INTRAVENOUS
  Filled 2015-03-18: qty 3000

## 2015-03-19 ENCOUNTER — Inpatient Hospital Stay (HOSPITAL_COMMUNITY): Payer: 59 | Admitting: Emergency Medicine

## 2015-03-19 ENCOUNTER — Inpatient Hospital Stay (HOSPITAL_COMMUNITY): Payer: 59

## 2015-03-19 ENCOUNTER — Encounter (HOSPITAL_COMMUNITY): Payer: Self-pay | Admitting: *Deleted

## 2015-03-19 ENCOUNTER — Inpatient Hospital Stay (HOSPITAL_COMMUNITY): Payer: 59 | Admitting: Certified Registered Nurse Anesthetist

## 2015-03-19 ENCOUNTER — Encounter (HOSPITAL_COMMUNITY)
Admission: RE | Disposition: A | Discharge status: Home / Self Care | Payer: Self-pay | Source: Ambulatory Visit | Attending: Neurosurgery

## 2015-03-19 ENCOUNTER — Inpatient Hospital Stay (HOSPITAL_COMMUNITY)
Admission: RE | Admit: 2015-03-19 | Discharge: 2015-03-20 | DRG: 460 | Disposition: A | Discharge status: Home / Self Care | Payer: 59 | Source: Ambulatory Visit | Attending: Neurosurgery | Admitting: Neurosurgery

## 2015-03-19 DIAGNOSIS — Z7984 Long term (current) use of oral hypoglycemic drugs: Secondary | ICD-10-CM

## 2015-03-19 DIAGNOSIS — Z79899 Other long term (current) drug therapy: Secondary | ICD-10-CM

## 2015-03-19 DIAGNOSIS — I10 Essential (primary) hypertension: Secondary | ICD-10-CM | POA: Diagnosis present

## 2015-03-19 DIAGNOSIS — G4730 Sleep apnea, unspecified: Secondary | ICD-10-CM | POA: Diagnosis present

## 2015-03-19 DIAGNOSIS — Z87891 Personal history of nicotine dependence: Secondary | ICD-10-CM

## 2015-03-19 DIAGNOSIS — Z419 Encounter for procedure for purposes other than remedying health state, unspecified: Secondary | ICD-10-CM

## 2015-03-19 DIAGNOSIS — M549 Dorsalgia, unspecified: Secondary | ICD-10-CM | POA: Diagnosis present

## 2015-03-19 DIAGNOSIS — M4800 Spinal stenosis, site unspecified: Secondary | ICD-10-CM | POA: Diagnosis present

## 2015-03-19 DIAGNOSIS — M5136 Other intervertebral disc degeneration, lumbar region: Secondary | ICD-10-CM | POA: Diagnosis present

## 2015-03-19 DIAGNOSIS — M51369 Other intervertebral disc degeneration, lumbar region without mention of lumbar back pain or lower extremity pain: Secondary | ICD-10-CM | POA: Diagnosis present

## 2015-03-19 DIAGNOSIS — E119 Type 2 diabetes mellitus without complications: Secondary | ICD-10-CM | POA: Diagnosis present

## 2015-03-19 LAB — GLUCOSE, CAPILLARY
Glucose-Capillary: 160 mg/dL — ABNORMAL HIGH (ref 65–99)
Glucose-Capillary: 239 mg/dL — ABNORMAL HIGH (ref 65–99)
Glucose-Capillary: 265 mg/dL — ABNORMAL HIGH (ref 65–99)

## 2015-03-19 LAB — TYPE AND SCREEN
ABO/RH(D): A POS
Antibody Screen: POSITIVE

## 2015-03-19 SURGERY — POSTERIOR LUMBAR FUSION 1 LEVEL
Anesthesia: General | Site: Spine Lumbar

## 2015-03-19 MED ORDER — ONDANSETRON HCL 4 MG/2ML IJ SOLN
INTRAMUSCULAR | Status: DC | PRN
  Administered 2015-03-19: 4 mg via INTRAVENOUS

## 2015-03-19 MED ORDER — GLYCOPYRROLATE 0.2 MG/ML IJ SOLN
INTRAMUSCULAR | Status: DC | PRN
  Administered 2015-03-19: .6 mg via INTRAVENOUS

## 2015-03-19 MED ORDER — FENTANYL CITRATE (PF) 100 MCG/2ML IJ SOLN
INTRAMUSCULAR | Status: DC | PRN
  Administered 2015-03-19: 75 ug via INTRAVENOUS
  Administered 2015-03-19 (×2): 50 ug via INTRAVENOUS
  Administered 2015-03-19 (×2): 25 ug via INTRAVENOUS
  Administered 2015-03-19: 50 ug via INTRAVENOUS
  Administered 2015-03-19: 75 ug via INTRAVENOUS
  Administered 2015-03-19: 100 ug via INTRAVENOUS
  Administered 2015-03-19: 50 ug via INTRAVENOUS

## 2015-03-19 MED ORDER — LIDOCAINE HCL (CARDIAC) 20 MG/ML IV SOLN
INTRAVENOUS | Status: AC
  Filled 2015-03-19: qty 5

## 2015-03-19 MED ORDER — FENTANYL CITRATE (PF) 250 MCG/5ML IJ SOLN
INTRAMUSCULAR | Status: AC
  Filled 2015-03-19: qty 5

## 2015-03-19 MED ORDER — HYDROMORPHONE HCL 1 MG/ML IJ SOLN
0.5000 mg | INTRAMUSCULAR | Status: DC | PRN
  Administered 2015-03-19: 1 mg via INTRAVENOUS
  Filled 2015-03-19: qty 1

## 2015-03-19 MED ORDER — ONDANSETRON HCL 4 MG/2ML IJ SOLN
INTRAMUSCULAR | Status: AC
  Filled 2015-03-19: qty 2

## 2015-03-19 MED ORDER — VANCOMYCIN HCL 1000 MG IV SOLR
INTRAVENOUS | Status: AC
  Filled 2015-03-19: qty 1000

## 2015-03-19 MED ORDER — NEOSTIGMINE METHYLSULFATE 10 MG/10ML IV SOLN
INTRAVENOUS | Status: DC | PRN
  Administered 2015-03-19: 4 mg via INTRAVENOUS

## 2015-03-19 MED ORDER — PRAVASTATIN SODIUM 40 MG PO TABS
40.0000 mg | ORAL_TABLET | Freq: Every day | ORAL | Status: DC
  Administered 2015-03-20: 40 mg via ORAL
  Filled 2015-03-19 (×2): qty 1

## 2015-03-19 MED ORDER — SODIUM CHLORIDE 0.9% FLUSH: 3.0000 mL | INTRAVENOUS | Status: DC | PRN

## 2015-03-19 MED ORDER — LOSARTAN POTASSIUM 50 MG PO TABS
100.0000 mg | ORAL_TABLET | Freq: Every day | ORAL | Status: DC
  Administered 2015-03-20: 100 mg via ORAL
  Filled 2015-03-19 (×2): qty 2

## 2015-03-19 MED ORDER — HYDROCODONE-ACETAMINOPHEN 5-325 MG PO TABS: 1.0000 | ORAL_TABLET | ORAL | Status: DC | PRN

## 2015-03-19 MED ORDER — BUPIVACAINE HCL (PF) 0.25 % IJ SOLN
INTRAMUSCULAR | Status: DC | PRN
  Administered 2015-03-19: 30 mL

## 2015-03-19 MED ORDER — OXYCODONE-ACETAMINOPHEN 5-325 MG PO TABS
1.0000 | ORAL_TABLET | ORAL | Status: DC | PRN
  Administered 2015-03-19 – 2015-03-20 (×6): 2 via ORAL
  Filled 2015-03-19 (×5): qty 2

## 2015-03-19 MED ORDER — NEOSTIGMINE METHYLSULFATE 10 MG/10ML IV SOLN
INTRAVENOUS | Status: AC
  Filled 2015-03-19: qty 1

## 2015-03-19 MED ORDER — OXYCODONE-ACETAMINOPHEN 5-325 MG PO TABS
ORAL_TABLET | ORAL | Status: AC
  Filled 2015-03-19: qty 2

## 2015-03-19 MED ORDER — MENTHOL 3 MG MT LOZG: 1.0000 | LOZENGE | OROMUCOSAL | Status: DC | PRN

## 2015-03-19 MED ORDER — PHENYLEPHRINE HCL 10 MG/ML IJ SOLN
INTRAMUSCULAR | Status: DC | PRN
  Administered 2015-03-19 (×6): 40 ug via INTRAVENOUS
  Administered 2015-03-19 (×3): 80 ug via INTRAVENOUS

## 2015-03-19 MED ORDER — MORPHINE SULFATE ER 15 MG PO TBCR
15.0000 mg | EXTENDED_RELEASE_TABLET | Freq: Two times a day (BID) | ORAL | Status: DC
  Administered 2015-03-19 – 2015-03-20 (×2): 15 mg via ORAL
  Filled 2015-03-19 (×2): qty 1

## 2015-03-19 MED ORDER — GLYCOPYRROLATE 0.2 MG/ML IJ SOLN
INTRAMUSCULAR | Status: AC
  Filled 2015-03-19: qty 5

## 2015-03-19 MED ORDER — DIAZEPAM 5 MG PO TABS
5.0000 mg | ORAL_TABLET | Freq: Four times a day (QID) | ORAL | Status: DC | PRN
  Administered 2015-03-19 – 2015-03-20 (×4): 5 mg via ORAL
  Filled 2015-03-19 (×3): qty 1

## 2015-03-19 MED ORDER — SODIUM CHLORIDE 0.9% FLUSH: 3.0000 mL | Freq: Two times a day (BID) | INTRAVENOUS | Status: DC

## 2015-03-19 MED ORDER — LACTATED RINGERS IV SOLN
INTRAVENOUS | Status: DC
  Administered 2015-03-19 (×3): via INTRAVENOUS

## 2015-03-19 MED ORDER — PROPOFOL 10 MG/ML IV BOLUS
INTRAVENOUS | Status: DC | PRN
  Administered 2015-03-19 (×2): 10 mg via INTRAVENOUS
  Administered 2015-03-19: 100 mg via INTRAVENOUS
  Administered 2015-03-19: 200 mg via INTRAVENOUS

## 2015-03-19 MED ORDER — INSULIN ASPART 100 UNIT/ML ~~LOC~~ SOLN
0.0000 [IU] | Freq: Every day | SUBCUTANEOUS | Status: DC
  Administered 2015-03-19: 3 [IU] via SUBCUTANEOUS

## 2015-03-19 MED ORDER — MIDAZOLAM HCL 2 MG/2ML IJ SOLN
INTRAMUSCULAR | Status: AC
  Filled 2015-03-19: qty 2

## 2015-03-19 MED ORDER — PHENYLEPHRINE HCL 10 MG/ML IJ SOLN
INTRAMUSCULAR | Status: AC
  Filled 2015-03-19: qty 1

## 2015-03-19 MED ORDER — ARTIFICIAL TEARS OP OINT
TOPICAL_OINTMENT | OPHTHALMIC | Status: AC
  Filled 2015-03-19: qty 3.5

## 2015-03-19 MED ORDER — INSULIN ASPART 100 UNIT/ML ~~LOC~~ SOLN: 0.0000 [IU] | Freq: Three times a day (TID) | SUBCUTANEOUS | Status: DC

## 2015-03-19 MED ORDER — 0.9 % SODIUM CHLORIDE (POUR BTL) OPTIME
TOPICAL | Status: DC | PRN
  Administered 2015-03-19: 1000 mL

## 2015-03-19 MED ORDER — VANCOMYCIN HCL 1000 MG IV SOLR
INTRAVENOUS | Status: DC | PRN
  Administered 2015-03-19: 1000 mg via TOPICAL

## 2015-03-19 MED ORDER — ACETAMINOPHEN 650 MG RE SUPP: 650.0000 mg | RECTAL | Status: DC | PRN

## 2015-03-19 MED ORDER — ONDANSETRON HCL 4 MG/2ML IJ SOLN: 4.0000 mg | INTRAMUSCULAR | Status: DC | PRN

## 2015-03-19 MED ORDER — PROMETHAZINE HCL 25 MG/ML IJ SOLN: 6.2500 mg | INTRAMUSCULAR | Status: DC | PRN

## 2015-03-19 MED ORDER — CEFAZOLIN SODIUM 1-5 GM-% IV SOLN
1.0000 g | Freq: Three times a day (TID) | INTRAVENOUS | Status: AC
Start: 2015-03-19 — End: 2015-03-20
  Administered 2015-03-19 – 2015-03-20 (×2): 1 g via INTRAVENOUS
  Filled 2015-03-19 (×2): qty 50

## 2015-03-19 MED ORDER — CITALOPRAM HYDROBROMIDE 40 MG PO TABS
40.0000 mg | ORAL_TABLET | Freq: Every day | ORAL | Status: DC
  Administered 2015-03-20: 40 mg via ORAL
  Filled 2015-03-19: qty 1

## 2015-03-19 MED ORDER — MORPHINE SULFATE ER 30 MG PO TBCR: 30.0000 mg | EXTENDED_RELEASE_TABLET | Freq: Two times a day (BID) | ORAL | Status: DC

## 2015-03-19 MED ORDER — TRAMADOL HCL 50 MG PO TABS: 50.0000 mg | ORAL_TABLET | Freq: Four times a day (QID) | ORAL | Status: DC | PRN

## 2015-03-19 MED ORDER — DEXAMETHASONE SODIUM PHOSPHATE 10 MG/ML IJ SOLN
INTRAMUSCULAR | Status: AC
  Filled 2015-03-19: qty 1

## 2015-03-19 MED ORDER — ACETAMINOPHEN 325 MG PO TABS: 650.0000 mg | ORAL_TABLET | ORAL | Status: DC | PRN

## 2015-03-19 MED ORDER — PHENYLEPHRINE HCL 10 MG/ML IJ SOLN
10.0000 mg | INTRAVENOUS | Status: DC | PRN
  Administered 2015-03-19: 20 ug/min via INTRAVENOUS

## 2015-03-19 MED ORDER — DIAZEPAM 5 MG PO TABS
ORAL_TABLET | ORAL | Status: AC
  Filled 2015-03-19: qty 1

## 2015-03-19 MED ORDER — HYDROCHLOROTHIAZIDE 25 MG PO TABS: 25.0000 mg | ORAL_TABLET | Freq: Every day | ORAL | Status: DC

## 2015-03-19 MED ORDER — ROCURONIUM BROMIDE 50 MG/5ML IV SOLN
INTRAVENOUS | Status: AC
  Filled 2015-03-19: qty 2

## 2015-03-19 MED ORDER — FENTANYL CITRATE (PF) 100 MCG/2ML IJ SOLN
25.0000 ug | INTRAMUSCULAR | Status: DC | PRN
  Administered 2015-03-19 (×2): 25 ug via INTRAVENOUS
  Administered 2015-03-19: 50 ug via INTRAVENOUS

## 2015-03-19 MED ORDER — SODIUM CHLORIDE 0.9 % IR SOLN
Status: DC | PRN
  Administered 2015-03-19: 500 mL

## 2015-03-19 MED ORDER — FENTANYL CITRATE (PF) 100 MCG/2ML IJ SOLN
INTRAMUSCULAR | Status: AC
  Filled 2015-03-19: qty 2

## 2015-03-19 MED ORDER — THROMBIN 20000 UNITS EX SOLR
CUTANEOUS | Status: DC | PRN
  Administered 2015-03-19: 20 mL via TOPICAL

## 2015-03-19 MED ORDER — HYDROCHLOROTHIAZIDE 25 MG PO TABS
25.0000 mg | ORAL_TABLET | Freq: Every day | ORAL | Status: DC
  Administered 2015-03-20: 25 mg via ORAL
  Filled 2015-03-19 (×2): qty 1

## 2015-03-19 MED ORDER — DEXAMETHASONE SODIUM PHOSPHATE 4 MG/ML IJ SOLN
INTRAMUSCULAR | Status: DC | PRN
  Administered 2015-03-19: 4 mg via INTRAVENOUS

## 2015-03-19 MED ORDER — AMLODIPINE BESYLATE 10 MG PO TABS
10.0000 mg | ORAL_TABLET | Freq: Every day | ORAL | Status: DC
Start: 2015-03-20 — End: 2015-03-20
  Administered 2015-03-20: 10 mg via ORAL
  Filled 2015-03-19: qty 1

## 2015-03-19 MED ORDER — ROCURONIUM BROMIDE 50 MG/5ML IV SOLN
INTRAVENOUS | Status: AC
Start: 2015-03-19 — End: 2015-03-19
  Filled 2015-03-19: qty 1

## 2015-03-19 MED ORDER — PROPOFOL 10 MG/ML IV BOLUS
INTRAVENOUS | Status: AC
  Filled 2015-03-19: qty 20

## 2015-03-19 MED ORDER — ROCURONIUM BROMIDE 100 MG/10ML IV SOLN
INTRAVENOUS | Status: DC | PRN
  Administered 2015-03-19: 20 mg via INTRAVENOUS
  Administered 2015-03-19: 50 mg via INTRAVENOUS

## 2015-03-19 MED ORDER — MIDAZOLAM HCL 5 MG/5ML IJ SOLN
INTRAMUSCULAR | Status: DC | PRN
  Administered 2015-03-19: 2 mg via INTRAVENOUS

## 2015-03-19 MED ORDER — METFORMIN HCL 500 MG PO TABS
500.0000 mg | ORAL_TABLET | Freq: Every day | ORAL | Status: DC
  Administered 2015-03-20: 500 mg via ORAL
  Filled 2015-03-19: qty 1

## 2015-03-19 MED ORDER — DEXAMETHASONE SODIUM PHOSPHATE 10 MG/ML IJ SOLN: 10.0000 mg | INTRAMUSCULAR | Status: DC

## 2015-03-19 MED ORDER — PHENOL 1.4 % MT LIQD: 1.0000 | OROMUCOSAL | Status: DC | PRN

## 2015-03-19 MED ORDER — GLYCOPYRROLATE 0.2 MG/ML IJ SOLN
INTRAMUSCULAR | Status: AC
  Filled 2015-03-19: qty 2

## 2015-03-19 MED ORDER — LIDOCAINE HCL (CARDIAC) 20 MG/ML IV SOLN
INTRAVENOUS | Status: DC | PRN
  Administered 2015-03-19: 100 mg via INTRAVENOUS

## 2015-03-19 SURGICAL SUPPLY — 71 items
APL SKNCLS STERI-STRIP NONHPOA (GAUZE/BANDAGES/DRESSINGS) ×1
BAG DECANTER FOR FLEXI CONT (MISCELLANEOUS) ×3 IMPLANT
BENZOIN TINCTURE PRP APPL 2/3 (GAUZE/BANDAGES/DRESSINGS) ×3 IMPLANT
BLADE CLIPPER SURG (BLADE) IMPLANT
BRUSH SCRUB EZ PLAIN DRY (MISCELLANEOUS) ×3 IMPLANT
BUR CUTTER 7.0 ROUND (BURR) ×2 IMPLANT
BUR MATCHSTICK NEURO 3.0 LAGG (BURR) ×3 IMPLANT
CANISTER SUCT 3000ML PPV (MISCELLANEOUS) ×3 IMPLANT
CAP LCK SPNE (Orthopedic Implant) ×4 IMPLANT
CAP LOCK SPINE RADIUS (Orthopedic Implant) IMPLANT
CAP LOCKING (Orthopedic Implant) ×12 IMPLANT
CLOSURE WOUND 1/2 X4 (GAUZE/BANDAGES/DRESSINGS) ×2
CONT SPEC 4OZ CLIKSEAL STRL BL (MISCELLANEOUS) ×3 IMPLANT
COVER BACK TABLE 60X90IN (DRAPES) ×3 IMPLANT
DECANTER SPIKE VIAL GLASS SM (MISCELLANEOUS) ×3 IMPLANT
DEVICE INTERBODY ELEVATE 23X10 (Cage) ×4 IMPLANT
DRAPE C-ARM 42X72 X-RAY (DRAPES) ×6 IMPLANT
DRAPE LAPAROTOMY 100X72X124 (DRAPES) ×3 IMPLANT
DRAPE POUCH INSTRU U-SHP 10X18 (DRAPES) ×3 IMPLANT
DRAPE PROXIMA HALF (DRAPES) ×2 IMPLANT
DRAPE SURG 17X23 STRL (DRAPES) ×12 IMPLANT
DRSG OPSITE POSTOP 4X6 (GAUZE/BANDAGES/DRESSINGS) ×2 IMPLANT
DURAPREP 26ML APPLICATOR (WOUND CARE) ×3 IMPLANT
ELECT REM PT RETURN 9FT ADLT (ELECTROSURGICAL) ×3
ELECTRODE REM PT RTRN 9FT ADLT (ELECTROSURGICAL) ×1 IMPLANT
EVACUATOR 1/8 PVC DRAIN (DRAIN) ×1 IMPLANT
GAUZE SPONGE 4X4 12PLY STRL (GAUZE/BANDAGES/DRESSINGS) ×1 IMPLANT
GAUZE SPONGE 4X4 16PLY XRAY LF (GAUZE/BANDAGES/DRESSINGS) IMPLANT
GLOVE BIO SURGEON STRL SZ 6.5 (GLOVE) ×1 IMPLANT
GLOVE BIO SURGEONS STRL SZ 6.5 (GLOVE) ×1
GLOVE BIOGEL PI IND STRL 6.5 (GLOVE) IMPLANT
GLOVE BIOGEL PI IND STRL 7.0 (GLOVE) IMPLANT
GLOVE BIOGEL PI IND STRL 7.5 (GLOVE) IMPLANT
GLOVE BIOGEL PI INDICATOR 6.5 (GLOVE) ×2
GLOVE BIOGEL PI INDICATOR 7.0 (GLOVE) ×6
GLOVE BIOGEL PI INDICATOR 7.5 (GLOVE) ×6
GLOVE ECLIPSE 9.0 STRL (GLOVE) ×6 IMPLANT
GLOVE EXAM NITRILE LRG STRL (GLOVE) IMPLANT
GLOVE EXAM NITRILE MD LF STRL (GLOVE) IMPLANT
GLOVE EXAM NITRILE XL STR (GLOVE) IMPLANT
GLOVE EXAM NITRILE XS STR PU (GLOVE) IMPLANT
GLOVE SURG SS PI 6.5 STRL IVOR (GLOVE) ×2 IMPLANT
GLOVE SURG SS PI 7.0 STRL IVOR (GLOVE) ×2 IMPLANT
GOWN STRL REUS W/ TWL LRG LVL3 (GOWN DISPOSABLE) IMPLANT
GOWN STRL REUS W/ TWL XL LVL3 (GOWN DISPOSABLE) ×2 IMPLANT
GOWN STRL REUS W/TWL 2XL LVL3 (GOWN DISPOSABLE) IMPLANT
GOWN STRL REUS W/TWL LRG LVL3 (GOWN DISPOSABLE) ×6
GOWN STRL REUS W/TWL XL LVL3 (GOWN DISPOSABLE) ×6
KIT BASIN OR (CUSTOM PROCEDURE TRAY) ×3 IMPLANT
KIT ROOM TURNOVER OR (KITS) ×3 IMPLANT
LIQUID BAND (GAUZE/BANDAGES/DRESSINGS) ×3 IMPLANT
MILL MEDIUM DISP (BLADE) ×2 IMPLANT
NEEDLE HYPO 22GX1.5 SAFETY (NEEDLE) ×3 IMPLANT
NS IRRIG 1000ML POUR BTL (IV SOLUTION) ×3 IMPLANT
PACK LAMINECTOMY NEURO (CUSTOM PROCEDURE TRAY) ×3 IMPLANT
ROD RADIUS 40MM (Neuro Prosthesis/Implant) ×3 IMPLANT
ROD RADIUS 45MM (Rod) ×3 IMPLANT
ROD SPNL 40X5.5XNS TI RDS (Neuro Prosthesis/Implant) IMPLANT
ROD SPNL 45X5.5XNS TI RDS (Rod) IMPLANT
SCREW 6.75X40MM (Screw) ×4 IMPLANT
SCREW 6.75X45MM (Screw) ×4 IMPLANT
SPONGE SURGIFOAM ABS GEL 100 (HEMOSTASIS) ×3 IMPLANT
STRIP CLOSURE SKIN 1/2X4 (GAUZE/BANDAGES/DRESSINGS) ×4 IMPLANT
SUT VIC AB 0 CT1 18XCR BRD8 (SUTURE) ×2 IMPLANT
SUT VIC AB 0 CT1 8-18 (SUTURE) ×3
SUT VIC AB 2-0 CT1 18 (SUTURE) ×5 IMPLANT
SUT VIC AB 3-0 SH 8-18 (SUTURE) ×6 IMPLANT
TOWEL OR 17X24 6PK STRL BLUE (TOWEL DISPOSABLE) ×3 IMPLANT
TOWEL OR 17X26 10 PK STRL BLUE (TOWEL DISPOSABLE) ×3 IMPLANT
TRAY FOLEY W/METER SILVER 14FR (SET/KITS/TRAYS/PACK) ×3 IMPLANT
WATER STERILE IRR 1000ML POUR (IV SOLUTION) ×3 IMPLANT

## 2015-03-19 NOTE — H&P (Signed)
Nicole Hubbard is an 50 y.o. female.   Chief Complaint: Back pain HPI: A 50 year old female with chronic intractable back pain with intermittent bilateral lower extremity symptoms which have failed all conservative management. Workup demonstrates evidence of progressive disc degeneration with a central disc herniation and foraminal collapse with moderate foraminal stenosis. Patient presents now for L5-S1 posterior lumbar fusion with instrumentation in hopes of improving her symptoms.  Past Medical History  Diagnosis Date  . Depression   . Diabetes mellitus without complication ([place])   . Hypertension   . Chest pain   . Migraine   . Anxiety   . Complication of anesthesia     Pt stated "I gagged out after having ablation at [facility]"  . Sleep apnea     does not wear CPAP    Past Surgical History  Procedure Laterality Date  . Knee surgery    . Tonsillectomy    . Cervical ablation      Family History  Problem Relation Age of Onset  . Cancer Father     urithrial   Social History:  reports that she quit smoking about 6 years ago. Her smoking use included Cigarettes. She has never used smokeless tobacco. She reports that she drinks about 1.2 oz of alcohol per week. She reports that she does not use illicit drugs.  Allergies: No Known Allergies  Medications Prior to Admission  Medication Sig Dispense Refill  . amLODipine (NORVASC) 10 MG tablet TAKE 1 TABLET (10 MG TOTAL) BY MOUTH DAILY. 90 tablet 1  . citalopram (CELEXA) 40 MG tablet TAKE 1 TABLET (40 MG TOTAL) BY MOUTH DAILY. FOR DEPRESSION 90 tablet 0  . hydrochlorothiazide (HYDRODIURIL) 25 MG tablet TAKE 1 TABLET BY MOUTH DAILY FOR HIGH BLOOD PRESSURE 90 tablet 0  . losartan (COZAAR) 100 MG tablet TAKE 1 TABLET BY MOUTH EVERY DAY FOR HIGH BLOOD PRESSURE 30 tablet 0  . metFORMIN (GLUCOPHAGE) 500 MG tablet TAKE 1 TABLET BY MOUTH 2 TIMES DAILY WITH A MEAL FOR DIABETES MANAGEMENT 180 tablet 0  . oxymorphone (OPANA ER) 5 MG 12 hr  tablet Take 5 mg by mouth every 12 (twelve) hours.    . pravastatin (PRAVACHOL) 40 MG tablet TAKE 1 TABLET (40 MG TOTAL) BY MOUTH DAILY. 90 tablet 0  . traMADol (ULTRAM) 50 MG tablet Take 50 mg by mouth every 6 (six) hours as needed for moderate pain.    . blood glucose meter kit and supplies KIT Dispense based on patient and insurance preference. Use up to four times daily as directed. (FOR ICD-9 250.00, 250.01). Strips with 11 refills  Lancets with 11 refills 1 each 0  . cyclobenzaprine (FLEXERIL) 10 MG tablet Take 1 tablet (10 mg total) by mouth 3 (three) times daily as needed for muscle spasms. (Patient not taking: Reported on 03/14/2015) 20 tablet 0  . Dulaglutide (TRULICITY) 1.57 WI/2.0BT SOPN Inject one dose [place] weekly (Patient not taking: Reported on 03/14/2015) 12 pen 2  . hydrochlorothiazide (HYDRODIURIL) 25 MG tablet TAKE 1 TABLET BY MOUTH DAILY FOR HIGH BLOOD PRESSURE 90 tablet 0  . HYDROcodone-acetaminophen (NORCO/VICODIN) 5-325 MG per tablet Take 1 tablet by mouth every 6 (six) hours as needed for moderate pain. (Patient not taking: Reported on 03/14/2015) 30 tablet 0  . metFORMIN (GLUCOPHAGE) 500 MG tablet TAKE 1 TABLET BY MOUTH 2 TIMES DAILY WITH A MEAL FOR DIABETES MANAGEMENT 180 tablet 0  . oxyCODONE-acetaminophen (ROXICET) 5-325 MG tablet Take 1 tablet by mouth every 8 (eight) hours  as needed for severe pain. (Patient not taking: Reported on 03/14/2015) 30 tablet 0  . SUMAtriptan (IMITREX) 50 MG tablet Take 1 - 2 tablets as needed for migraine.  Max 100 mg in 24 hours (Patient not taking: Reported on 03/14/2015) 10 tablet 0    No results found for this or any previous visit (from the past 48 hour(s)). No results found.  Pertinent items noted in HPI and remainder of comprehensive ROS otherwise negative.  Blood pressure 131/92, pulse 106, temperature 97.8 F (36.6 C), temperature source Oral, resp. rate 20, weight 142.883 kg (315 lb), SpO2 97 %.  The patient is awake and alert. She is  oriented and appropriate. She is overweight. Examination of her head ears eyes unremarkable. Chest and abdomen are benign. Extremities are free from injury deformity. Neurologically she is awake and alert. She is oriented and appropriate. Her cranial nerve function are intact. Ears motor and sensory function of the extremities are normal. Reflexes are hypoactive but symmetric. There is no evidence of long track signs. Gait is somewhat antalgic. Posterior is normal. Lumbar spine is moderately tender with some spasm. Assessment/Plan  L5-S1 degenerative disc disease with central disc herniation and bilateral foraminal stenosis. Plan bilateral L5-S1 decompressive laminotomies with foraminotomies followed by posterior lumbar interbody fusion utilizing interbody expandable cages, locally harvested autograft, and augmented with posterior lateral arthrodesis utilizing nonsegmental pedicle screw instrumentation and local autografting. Risks and benefits been explained. Patient wishes to proceed.  Nicole Hubbard A 03/19/2015, 12:41 PM

## 2015-03-19 NOTE — Op Note (Signed)
Date of procedure: 03/19/2015  Date of dictation: Same  Service: Neurosurgery  Preoperative diagnosis: L5-S1 herniated nucleus pulposus, L5-S1 degenerative disc disease, bilateral L5-S1 disc space collapse with foraminal stenosis  Postoperative diagnosis: Same  Procedure Name: Bilateral L5-S1 decompressive laminotomies with bilateral L5 and S1 decompressive foraminotomies, more than would be required for simple interbody fusion alone.  L5-S1 posterior lumbar interbody fusion utilizing interbody expandable cages, locally harvested autograft.  L5-S1 posterior lateral arthrodesis utilizing nonsegmental pedicle screw fixation and local autograft.  Surgeon:Berlyn Saylor A.Melissa Tomaselli, M.D.  Asst. Surgeon: Ditty  Anesthesia: General  Indication: 50 year old female with intractable back and lower extremity pain failing all conservative management. Workup demonstrates evidence of marked disc space degeneration with disc space collapse a central disc herniation and bilateral foraminal stenosis. Patient presents now for L5-S1 decompression and fusion in hopes of improving her symptoms.  Operative note: After induction of anesthesia, patient positioned prone on the Wilson frame and appropriately padded. Lumbar region prepped and draped sterilely. Incision made overlying L5-S1. Dissection performed bilaterally. Retractor placed. Fluoroscopy used. Levels confirmed. Decompressive laminotomies performed bilaterally using Leksell rongeurs Kerrison rongeurs a high-speed drill to remove the inferior two thirds of the lamina of L5 the entire inferior facet of L5 bilaterally the majority of the superior facet of S1 bilaterally and the superior aspect the S1 lamina. Ligament flavum elevated and resected piecemeal fashion. Decompressive foraminotomies performed on the course exiting L5 and S1 nerve roots bilaterally inaccessible will be required for simple interbody fusion alone. Bilateral discectomies performed at L5-S1. Disc  space distracted to 10 mm. With a 10 mm distractor on the patient's left side disc space was prepared for interbody fusion on the right using various scrapers and curettes. Soft tissue removed from interspace. A 10 mm [employer] expandable cage packed with locally harvested autograft with a 12 lordotic angle was impacted into place. This is then expanded to its full extent. Distractor was removed patient's left side. Disc space. Her prepared for fusion on the left side. Soft tissue removed from the interspace. Interspace packed with morselized autograft. A second cage impacted into place and expanded to its full extent. Pedicles of L5 and S1 identified using surface landmarks and intraoperative fluoroscopy. 2 pressure bone overlying the pedicle removed using a high-speed drill. Each pedicle was then probed using a pedicle awl each pedicle tract was tapped with a 5.25 mm screw. Each screw Hole was probed and found to be solidly within the bone. 6.75 mm radius brand screws from [employer] were placed bilaterally at L5 and S1. Transverse processes and residual facets were decorticated using high-speed drill. Morselized autograft packed posterior laterally for later fusion. Final images revealed good position of the cages and the screws the proper operative level with normal alignment is fine. Short segment titanium rods placed over the screws. Locking caps placed over the screws. Locking caps engaged with the construct under mild compression. The wound irrigated one final time. Gel-filled postoperative hemostasis. Vancomycin powder placed a deep wound space. Wound closed in layers of Vicryl sutures. Steri-Strips and sterile dressing were applied. No apparent complications. Patient tolerated the procedure well and she returns to the recovery room postop.

## 2015-03-19 NOTE — Brief Op Note (Signed)
03/19/2015  3:24 PM  PATIENT:  Nicole Hubbard  50 y.o. female  PRE-OPERATIVE DIAGNOSIS:  HNP  POST-OPERATIVE DIAGNOSIS:  Herniated Nucleous Pulposus  PROCEDURE:  Procedure(s): POSTERIOR LUMBAR INTERBODY FUSION LUMBAR FIVE-SACRAL-ONE (N/A)  SURGEON:  Surgeon(s) and Role:    * Earnie Larsson, MD - Primary    * Kevan Ny Ditty, MD - Assisting  PHYSICIAN ASSISTANT:   ASSISTANTS:    ANESTHESIA:   general  EBL:  Total I/O In: 1900 [I.V.:1900] Out: 285 [Urine:60; Blood:225]  BLOOD ADMINISTERED:none  DRAINS: none   LOCAL MEDICATIONS USED:  MARCAINE     SPECIMEN:  No Specimen  DISPOSITION OF SPECIMEN:  N/A  COUNTS:  YES  TOURNIQUET:  * No tourniquets in log *  DICTATION: .Dragon Dictation  PLAN OF CARE: Admit to inpatient   PATIENT DISPOSITION:  PACU - hemodynamically stable.   Delay start of Pharmacological VTE agent (>24hrs) due to surgical blood loss or risk of bleeding: yes

## 2015-03-19 NOTE — Anesthesia Preprocedure Evaluation (Addendum)
Anesthesia Evaluation  Patient identified by MRN, date of birth, ID band Patient awake    Reviewed: Allergy & Precautions, H&P , Patient's Chart, lab work & pertinent test results, reviewed documented beta blocker date and time   Airway Mallampati: II  TM Distance: >3 FB Neck ROM: full    Dental no notable dental hx. (+) Teeth Intact, Dental Advisory Given   Pulmonary sleep apnea , former smoker,    Pulmonary exam normal breath sounds clear to auscultation       Cardiovascular hypertension, On Medications  Rhythm:regular Rate:Normal     Neuro/Psych    GI/Hepatic   Endo/Other  diabetesMorbid obesity  Renal/GU      Musculoskeletal   Abdominal   Peds  Hematology   Anesthesia Other Findings   Reproductive/Obstetrics                            Anesthesia Physical Anesthesia Plan  ASA: II  Anesthesia Plan: General   Post-op Pain Management:    Induction: Intravenous  Airway Management Planned: Oral ETT  Additional Equipment:   Intra-op Plan:   Post-operative Plan: Extubation in OR  Informed Consent: I have reviewed the patients History and Physical, chart, labs and discussed the procedure including the risks, benefits and alternatives for the proposed anesthesia with the patient or authorized representative who has indicated his/her understanding and acceptance.   Dental Advisory Given and Dental advisory given  Plan Discussed with: CRNA and Surgeon  Anesthesia Plan Comments: (  Discussed general anesthesia, including possible nausea, instrumentation of airway, sore throat,pulmonary aspiration, etc. I asked if the were any outstanding questions, or  concerns before we proceeded. )        Anesthesia Quick Evaluation

## 2015-03-19 NOTE — Anesthesia Procedure Notes (Signed)
Procedure Name: Intubation Date/Time: 03/19/2015 1:39 PM Performed by: Merdis Delay Pre-anesthesia Checklist: Patient identified, Timeout performed, Emergency Drugs available, Suction available and Patient being monitored Patient Re-evaluated:Patient Re-evaluated prior to inductionOxygen Delivery Method: Circle system utilized Intubation Type: IV induction Ventilation: Mask ventilation without difficulty Grade View: Grade II Tube type: Oral Tube size: 7.5 mm Airway Equipment and Method: Video-laryngoscopy Placement Confirmation: ETT inserted through vocal cords under direct vision,  breath sounds checked- equal and bilateral,  positive ETCO2 and CO2 detector Secured at: 21 cm Tube secured with: Tape Dental Injury: Teeth and Oropharynx as per pre-operative assessment  Difficulty Due To: Difficulty was unanticipated and Difficult Airway- due to anterior larynx Comments: Large neck. OSA--> DL with MAC 4 (poor view of arytenoids even with readjustments in positioning). DL then with glidescope 3- anterior airway with extremely small glottic opening. Poor grade 2 view even with glidescope.

## 2015-03-19 NOTE — Transfer of Care (Signed)
Immediate Anesthesia Transfer of Care Note  Patient: Nicole Hubbard  Procedure(s) Performed: Procedure(s): POSTERIOR LUMBAR INTERBODY FUSION LUMBAR FIVE-SACRAL-ONE (N/A)  Patient Location: PACU  Anesthesia Type:General  Level of Consciousness: awake, alert  and patient cooperative  Airway & Oxygen Therapy: Patient Spontanous Breathing and Patient connected to face mask oxygen, nasal trumpet in place  Post-op Assessment: Report given to RN and Post -op Vital signs reviewed and stable  Post vital signs: Reviewed and stable  Last Vitals:  Filed Vitals:   03/19/15 1143  BP: 131/92  Pulse: 106  Temp: 36.6 C  Resp: 20    Complications: No apparent anesthesia complications

## 2015-03-19 NOTE — Progress Notes (Signed)
Orthopedic Tech Progress Note Patient Details:  Nicole Hubbard [birth year] [id number]  Patient ID: Nicole Hubbard, female   DOB: Jan 03, 1966, 50 y.o.   MRN: [id number]   Nicole Hubbard 03/19/2015, 4:48 PMCalled [employer] for lumbar brace.

## 2015-03-19 NOTE — Anesthesia Postprocedure Evaluation (Signed)
Anesthesia Post Note  Patient: Nicole Hubbard  Procedure(s) Performed: Procedure(s) (LRB): POSTERIOR LUMBAR INTERBODY FUSION LUMBAR FIVE-SACRAL-ONE (N/A)  Patient location during evaluation: PACU Anesthesia Type: General Level of consciousness: awake and alert Pain management: pain level controlled Vital Signs Assessment: post-procedure vital signs reviewed and stable Respiratory status: spontaneous breathing, nonlabored ventilation, respiratory function stable and patient connected to nasal cannula oxygen Cardiovascular status: blood pressure returned to baseline and stable Postop Assessment: no signs of nausea or vomiting Anesthetic complications: no    Last Vitals:  Filed Vitals:   03/19/15 1615 03/19/15 1630  BP: 123/82 146/96  Pulse: 110 110  Temp:    Resp: 13 20    Last Pain:  Filed Vitals:   03/19/15 1636  PainSc: 6                  Elenie Coven A

## 2015-03-20 LAB — GLUCOSE, CAPILLARY
GLUCOSE-CAPILLARY: 207 mg/dL — AB (ref 65–99)
GLUCOSE-CAPILLARY: 149 mg/dL — AB (ref 65–99)
Glucose-Capillary: 175 mg/dL — ABNORMAL HIGH (ref 65–99)

## 2015-03-20 MED ORDER — DIAZEPAM 5 MG PO TABS: 5.0000 mg | ORAL_TABLET | Freq: Four times a day (QID) | ORAL | Status: DC | PRN

## 2015-03-20 MED ORDER — OXYCODONE-ACETAMINOPHEN 5-325 MG PO TABS: 1.0000 | ORAL_TABLET | ORAL | Status: DC | PRN

## 2015-03-20 NOTE — Progress Notes (Signed)
Patient alert and oriented, mae's well, voiding adequate amount of urine, swallowing without difficulty, c/o moderate pain and meds given prior to discharged. Patient discharged home with family. Script and discharged instructions given to patient. Patient and family stated understanding of instructions given.  

## 2015-03-20 NOTE — Discharge Summary (Signed)
Physician Discharge Summary  Patient ID: Nicole Hubbard MRN: [id number] DOB/AGE: January 20, 1965 50 y.o.  Admit date: 03/19/2015 Discharge date: 03/20/2015  Admission Diagnoses:  Discharge Diagnoses:  Active Problems:   DDD (degenerative disc disease), lumbar   Discharged Condition: good  Hospital Course: Patient admitted to the hospital where she underwent an uncomplicated J0-D3 decompression infusion. Postoperative she is doing very well. Back and lower trimming pain improved. Wound healing well. Ambulating without difficulty. Ready for discharge home.  Consults:   Significant Diagnostic Studies:   Treatments:   Discharge Exam: Blood pressure 100/48, pulse 107, temperature 98.6 F (37 C), temperature source Oral, resp. rate 18, weight 142.883 kg (315 lb), SpO2 96 %. Awake and alert. Oriented and appropriate. Cranial nerve function intact. Motor and sensory function extremities normal. Wound clean and dry. Chest and abdomen benign.  Disposition: 01-Home or Self Care     Medication List    TAKE these medications        amLODipine 10 MG tablet  Commonly known as:  NORVASC  TAKE 1 TABLET (10 MG TOTAL) BY MOUTH DAILY.     blood glucose meter kit and supplies Kit  Dispense based on patient and insurance preference. Use up to four times daily as directed. (FOR ICD-9 250.00, 250.01). Strips with 11 refills  Lancets with 11 refills     citalopram 40 MG tablet  Commonly known as:  CELEXA  TAKE 1 TABLET (40 MG TOTAL) BY MOUTH DAILY. FOR DEPRESSION     diazepam 5 MG tablet  Commonly known as:  VALIUM  Take 1-2 tablets (5-10 mg total) by mouth every 6 (six) hours as needed for muscle spasms.     Dulaglutide 0.75 MG/0.5ML Sopn  Commonly known as:  TRULICITY  Inject one dose [place] weekly     hydrochlorothiazide 25 MG tablet  Commonly known as:  HYDRODIURIL  TAKE 1 TABLET BY MOUTH DAILY FOR HIGH BLOOD PRESSURE     hydrochlorothiazide 25 MG tablet  Commonly known as:  HYDRODIURIL   TAKE 1 TABLET BY MOUTH DAILY FOR HIGH BLOOD PRESSURE     losartan 100 MG tablet  Commonly known as:  COZAAR  TAKE 1 TABLET BY MOUTH EVERY DAY FOR HIGH BLOOD PRESSURE     metFORMIN 500 MG tablet  Commonly known as:  GLUCOPHAGE  TAKE 1 TABLET BY MOUTH 2 TIMES DAILY WITH A MEAL FOR DIABETES MANAGEMENT     metFORMIN 500 MG tablet  Commonly known as:  GLUCOPHAGE  TAKE 1 TABLET BY MOUTH 2 TIMES DAILY WITH A MEAL FOR DIABETES MANAGEMENT     oxyCODONE-acetaminophen 5-325 MG tablet  Commonly known as:  PERCOCET/ROXICET  Take 1-2 tablets by mouth every 4 (four) hours as needed for moderate pain.     oxymorphone 5 MG 12 hr tablet  Commonly known as:  OPANA ER  Take 5 mg by mouth every 12 (twelve) hours.     pravastatin 40 MG tablet  Commonly known as:  PRAVACHOL  TAKE 1 TABLET (40 MG TOTAL) BY MOUTH DAILY.     traMADol 50 MG tablet  Commonly known as:  ULTRAM  Take 50 mg by mouth every 6 (six) hours as needed for moderate pain.           Follow-up Information    Follow up with Charlie Pitter, MD.   Specialty:  Neurosurgery   Contact information:   [address] [place] [address] [place] [address] [phone]       Signed: Earnie Larsson  A 03/20/2015, 4:48 PM

## 2015-03-20 NOTE — Discharge Instructions (Signed)

## 2015-03-20 NOTE — Evaluation (Signed)
Physical Therapy Evaluation Patient Details Name: Nicole Hubbard MRN: [id number] DOB: Oct 17, 1965 Today's Date: 03/20/2015   History of Present Illness  50 y.o. s/p POSTERIOR LUMBAR INTERBODY FUSION LUMBAR FIVE-SACRAL-ONE. PMH includes depression, DM, HTN, chest pain, migraine, anxiety, knee surgery, and pt with obesity.  Clinical Impression  Pt admitted with above diagnosis. Pt currently with functional limitations due to the deficits listed below (see PT Problem List). At the time of PT eval pt was able to perform transfers and ambulation with min guard to min assist for balance support and safety. Do feel this patient would benefit from another PT session prior to return home. Pt will benefit from skilled PT to increase their independence and safety with mobility to allow discharge to the venue listed below.       Follow Up Recommendations Outpatient PT;Supervision for mobility/OOB    Equipment Recommendations  3in1 (PT);Cane (vs. RW)    Recommendations for Other Services       Precautions / Restrictions Precautions Precautions: Back;Fall Precaution Booklet Issued: Yes (comment) Precaution Comments: Reviewed handout and pt was cued for precautions during functional mobility Required Braces or Orthoses: Spinal Brace Spinal Brace: Lumbar corset;Applied in sitting position Restrictions Weight Bearing Restrictions: No      Mobility  Bed Mobility Overal bed mobility: Needs Assistance Bed Mobility: Rolling;Sidelying to Sit Rolling: Supervision Sidelying to sit: Supervision;HOB elevated       General bed mobility comments: Pt demonstrated log roll well. VC's for proper technique.   Transfers Overall transfer level: Needs assistance Equipment used: Straight cane Transfers: Sit to/from Stand Sit to Stand: Min guard         General transfer comment: Pt reports minor dizziness upon first standing up.  Ambulation/Gait Ambulation/Gait assistance: Min assist Ambulation Distance  (Feet): 150 Feet Assistive device: Straight cane;None Gait Pattern/deviations: Step-to pattern;Decreased stride length;Narrow base of support Gait velocity: decreased Gait velocity interpretation: Below normal speed for age/gender General Gait Details: Initially, pt holding onto wall and railings in the hall for support. Pt was provided with [facility] and was able to improve to min assist. Very slow cadence noted.   Stairs            Wheelchair Mobility    Modified Rankin (Stroke Patients Only)       Balance Overall balance assessment: Needs assistance Sitting-balance support: Feet supported;No upper extremity supported Sitting balance-Leahy Scale: Fair     Standing balance support: No upper extremity supported;During functional activity Standing balance-Leahy Scale: Poor Standing balance comment: Reaching for outside support during dynamic balance activity.                              Pertinent Vitals/Pain Pain Assessment: Faces Pain Score: 10-Worst pain ever Faces Pain Scale: Hurts even more Pain Location: Back Pain Descriptors / Indicators: Operative site guarding;Aching Pain Intervention(s): Limited activity within patient's tolerance;Monitored during session;Repositioned    Home Living Family/patient expects to be discharged to:: Private residence Living Arrangements: Children Available Help at Discharge: Family;Available 24 hours/day Type of Home: House Home Access: Stairs to enter Entrance Stairs-Rails: None Entrance Stairs-Number of Steps: 1 Home Layout: One level Home Equipment: None Additional Comments: please see PT eval for more home set up information.    Prior Function Level of Independence: Needs assistance      ADL's / Homemaking Assistance Needed: assist with cooking and cleaning and dressing        Hand Dominance   Dominant  Hand: Right    Extremity/Trunk Assessment   Upper Extremity Assessment: Defer to OT evaluation            Lower Extremity Assessment: Generalized weakness      Cervical / Trunk Assessment: Normal  Communication   Communication: No difficulties  Cognition Arousal/Alertness: Awake/alert Behavior During Therapy: WFL for tasks assessed/performed Overall Cognitive Status: Within Functional Limits for tasks assessed                      General Comments      Exercises        Assessment/Plan    PT Assessment Patient needs continued PT services  PT Diagnosis Difficulty walking;Acute pain   PT Problem List Decreased strength;Decreased range of motion;Decreased activity tolerance;Decreased balance;Decreased mobility;Decreased knowledge of use of DME;Decreased safety awareness;Decreased knowledge of precautions;Decreased coordination;Pain  PT Treatment Interventions DME instruction;Gait training;Stair training;Functional mobility training;Therapeutic activities;Therapeutic exercise;Neuromuscular re-education;Patient/family education   PT Goals (Current goals can be found in the Care Plan section) Acute Rehab PT Goals Patient Stated Goal: get back to work and normal life PT Goal Formulation: With patient Time For Goal Achievement: 03/27/15 Potential to Achieve Goals: Good    Frequency Min 5X/week   Barriers to discharge        Co-evaluation               End of Session Equipment Utilized During Treatment: Gait belt;Back brace Activity Tolerance: Patient limited by pain;Patient limited by fatigue Patient left: in chair;with call bell/phone within reach Nurse Communication: Mobility status         Time: [id number] PT Time Calculation (min) (ACUTE ONLY): 25 min   Charges:   PT Evaluation $PT Eval Moderate Complexity: 1 Procedure PT Treatments $Gait Training: 8-22 mins   PT G Codes:        Rolinda Roan 2015-04-08, 12:37 PM  Rolinda Roan, PT, DPT Acute Rehabilitation Services Pager: [phone]

## 2015-03-20 NOTE — Evaluation (Signed)
Occupational Therapy Evaluation Patient Details Name: Nicole Hubbard MRN: [id number] DOB: 06-17-1965 Today's Date: 03/20/2015    History of Present Illness 50 y.o. s/p POSTERIOR LUMBAR INTERBODY FUSION LUMBAR FIVE-SACRAL-ONE. PMH includes depression, DM, HTN, chest pain, migraine, anxiety, knee surgery, and pt with obesity.   Clinical Impression   Pt s/p above. Pt getting assist with dressing and IADLs, PTA. Feel pt will benefit from acute OT to increase independence prior to d/c. Do not feel pt will need follow up OT upon d/c.    Follow Up Recommendations  No OT follow up;Supervision - Intermittent    Equipment Recommendations  3 in 1 bedside comode;Other (comment) (AE if wanted)    Recommendations for Other Services       Precautions / Restrictions Precautions Precautions: Back;Fall Precaution Booklet Issued: No Precaution Comments: educated on back precautions Required Braces or Orthoses: Spinal Brace Spinal Brace: Lumbar corset (already on pt in session) Restrictions Weight Bearing Restrictions: No      Mobility Bed Mobility       General bed mobility comments: not assessed  Transfers Overall transfer level: Needs assistance   Transfers: Sit to/from Stand Sit to Stand: Min guard              Balance      Min guard for ambulation and for standing with single leg stance.                                      ADL Overall ADL's : Needs assistance/impaired                     Lower Body Dressing: Maximal assistance;Sit to/from stand   Toilet Transfer: Min guard;Ambulation (sit to stand from chair)           Functional mobility during ADLs: Min guard General ADL Comments: Educated on AE. Discussed incorporating precautions into functional activities. Educated on back brace. Discussed 3 in 1.      Vision     Perception     Praxis      Pertinent Vitals/Pain Pain Assessment: 0-10 Pain Score: 10-Worst pain ever Pain  Location: back Pain Intervention(s): Monitored during session;Other (comment);Limited activity within patient's tolerance (notified nurse)     Hand Dominance     Extremity/Trunk Assessment Upper Extremity Assessment Upper Extremity Assessment: Overall WFL for tasks assessed   Lower Extremity Assessment Lower Extremity Assessment: Defer to PT evaluation       Communication Communication Communication: No difficulties   Cognition Arousal/Alertness: Awake/alert Behavior During Therapy: WFL for tasks assessed/performed Overall Cognitive Status: Within Functional Limits for tasks assessed                     General Comments       Exercises       Shoulder Instructions      Home Living Family/patient expects to be discharged to:: Private residence   Available Help at Discharge: Family               Bathroom Shower/Tub: Teacher, early years/pre: Standard         Additional Comments: please see PT eval for more home set up information.      Prior Functioning/Environment Level of Independence: Needs assistance    ADL's / Homemaking Assistance Needed: assist with cooking and cleaning and dressing  OT Diagnosis: Acute pain   OT Problem List: Decreased range of motion;Pain;Decreased knowledge of use of DME or AE;Decreased knowledge of precautions;Obesity;Impaired balance (sitting and/or standing);Decreased activity tolerance   OT Treatment/Interventions: Self-care/ADL training;DME and/or AE instruction;Therapeutic activities;Patient/family education;Balance training    OT Goals(Current goals can be found in the care plan section) Acute Rehab OT Goals Patient Stated Goal: get back to work and normal life OT Goal Formulation: With patient Time For Goal Achievement: 03/27/15 Potential to Achieve Goals: Good ADL Goals Pt Will Perform Lower Body Dressing: with set-up;sit to/from stand;with adaptive equipment Pt Will Transfer to Toilet:  with supervision;ambulating;with set-up (3 in 1 over commode) Pt Will Perform Toileting - Clothing Manipulation and hygiene: with set-up;sit to/from stand (with or without AE) Pt Will Perform Tub/Shower Transfer: Tub transfer;with supervision;ambulating;rolling walker;with set-up;3 in 1 Additional ADL Goal #1: Pt will independently verbalize 3/3 back precautions and maintain in session. Additional ADL Goal #2: Pt will don/doff back brace with setup assist.  OT Frequency: Min 2X/week   Barriers to D/C:            Co-evaluation              End of Session Equipment Utilized During Treatment: Back brace;Other (comment) (AE) Nurse Communication: Other (comment) (recommending 3 in 1; pain level)  Activity Tolerance: Patient limited by pain Patient left: in chair;with call bell/phone within reach   Time: [id number] OT Time Calculation (min): 15 min Charges:  OT General Charges $OT Visit: 1 Procedure OT Evaluation $OT Eval Moderate Complexity: 1 Procedure G-CodesBenito Mccreedy OTR/L [id number] 03/20/2015, 10:13 AM

## 2015-03-20 NOTE — Progress Notes (Signed)
Occupational Therapy Treatment Patient Details Name: Nicole Hubbard MRN: [id number] DOB: [birth year] Today's Date: 03/20/2015    History of present illness 50 y.o. s/p POSTERIOR LUMBAR INTERBODY FUSION LUMBAR FIVE-SACRAL-ONE. PMH includes depression, DM, HTN, chest pain, migraine, anxiety, knee surgery, and pt with obesity.   OT comments  Plan is for pt to discharge this evening. Education provided to pt and her daughter.  Follow Up Recommendations  No OT follow up;Supervision - Intermittent    Equipment Recommendations  3 in 1 bedside comode; pt already purchased AE   Recommendations for Other Services      Precautions / Restrictions Precautions Precautions: Back;Fall Precaution Booklet Issued: Yes (comment) Precaution Comments: reviewed back precautions Required Braces or Orthoses: Spinal Brace Spinal Brace: Applied in sitting position;Lumbar corset Restrictions Weight Bearing Restrictions: No       Mobility Bed Mobility Overal bed mobility: Needs Assistance Bed Mobility: Rolling;Sidelying to Sit; Sit to sidelying Rolling: Supervision Sidelying to sit: Supervision  Sit to Sidelying: Supervision       General bed mobility comments: cues for technique. HOB flat.   Transfers Overall transfer level: Needs assistance Equipment used: None Transfers: Sit to/from Stand Sit to Stand: Min guard              Balance    Min assist for simulated tub transfer.                                ADL Overall ADL's : Needs assistance/impaired                 Upper Body Dressing : Set up;Supervision/safety;Sitting   Lower Body Dressing: Min guard;With adaptive equipment;Sit to/from stand   Toilet Transfer: Min guard;Ambulation (sit to stand from bed)       Tub/ Shower Transfer: Minimal assistance;Ambulation;Tub transfer   Functional mobility during ADLs: Min guard General ADL Comments: Discussed incorporating precautions into functional activities.  Discussed back brace wear. Educated on AE and pt practiced with reacher and sock aid. Educated on safety such as rugs/items on floor and recommended pt have someone with her for tub transfer. Practiced simulated tub transfer technique. Discussed using 3 in 1 for shower chair and other option for shower chair.       Vision                     Perception     Praxis      Cognition  Awake/Alert Behavior During Therapy: WFL for tasks assessed/performed Overall Cognitive Status: Within Functional Limits for tasks assessed       Memory: Decreased recall of precautions;Decreased short-term memory               Extremity/Trunk Assessment               Exercises     Shoulder Instructions       General Comments      Pertinent Vitals/ Pain       Pain Assessment: 0-10 Pain Score: 10-Worst pain ever Pain Location: back Pain Intervention(s): Monitored during session  Home Living                                          Prior Functioning/Environment              Frequency Min 2X/week  Progress Toward Goals  OT Goals(current goals can now be found in the care plan section)  Progress towards OT goals: Progressing toward goals-adequate for d/c Acute Rehab OT Goals Patient Stated Goal: not stated OT Goal Formulation: With patient Time For Goal Achievement: 03/27/15 Potential to Achieve Goals: Good ADL Goals Pt Will Perform Lower Body Dressing: with set-up;sit to/from stand;with adaptive equipment Pt Will Transfer to Toilet: with supervision;ambulating;with set-up (3 in 1 over commode) Pt Will Perform Toileting - Clothing Manipulation and hygiene: with set-up;sit to/from stand (with or without AE) Pt Will Perform Tub/Shower Transfer: Tub transfer;with supervision;ambulating;rolling walker;with set-up;3 in 1 Additional ADL Goal #1: Pt will independently verbalize 3/3 back precautions and maintain in session. Additional ADL Goal #2: Pt  will don/doff back brace with setup assist.  Plan Discharge plan remains appropriate    Co-evaluation                 End of Session Equipment Utilized During Treatment: Gait belt;Back brace;Other (comment) (AE)   Activity Tolerance Patient tolerated treatment well   Patient Left in bed;with call bell/phone within reach;with family/visitor present   Nurse Communication Mobility status;Other (comment) (pt dressed)        Time: [id number] OT Time Calculation (min): 20 min  Charges: OT General Charges $OT Visit: 1 Procedure OT Treatments $Self Care/Home Management : 8-22 mins   Benito Mccreedy OTR/L [id number] 03/20/2015, 5:09 PM

## 2015-03-20 NOTE — Progress Notes (Signed)
Postoperative day 1. Patient overall doing well. Back pain well controlled. No lower extremity pain. Patient mobilizing reasonably well. Requests one further day of hospitalization prior to discharge home.  On examination she is afebrile. Her vitals are stable. She is awake and alert. Motor and sensory function are intact. Wound is clean and dry.  Doing well following L5-S1 decompression and fusion. Continue efforts at mobilization. Probable discharge tomorrow.

## 2015-03-21 LAB — TYPE AND SCREEN
ABO/RH(D): A POS
Antibody Screen: POSITIVE
DAT, IGG: NEGATIVE
PT AG Type: NEGATIVE
UNIT DIVISION: 0
Unit division: 0
Unit division: 0

## 2015-03-21 MED FILL — Sodium Chloride IV Soln 0.9%: INTRAVENOUS | Qty: 1000 | Status: AC

## 2015-03-21 MED FILL — Heparin Sodium (Porcine) Inj 1000 Unit/ML: INTRAMUSCULAR | Qty: 30 | Status: AC

## 2015-04-18 ENCOUNTER — Other Ambulatory Visit: Payer: Self-pay

## 2015-04-18 MED ORDER — LOSARTAN POTASSIUM 100 MG PO TABS: 100.0000 mg | ORAL_TABLET | Freq: Every day | ORAL | Status: DC

## 2015-06-01 ENCOUNTER — Encounter: Payer: Self-pay | Admitting: Family Medicine

## 2015-06-01 ENCOUNTER — Other Ambulatory Visit: Payer: Self-pay | Admitting: Urgent Care

## 2015-06-04 ENCOUNTER — Other Ambulatory Visit: Payer: Self-pay | Admitting: Emergency Medicine

## 2015-06-04 MED ORDER — LOSARTAN POTASSIUM 100 MG PO TABS: 100.0000 mg | ORAL_TABLET | Freq: Every day | ORAL | Status: DC

## 2015-06-09 ENCOUNTER — Other Ambulatory Visit: Payer: Self-pay | Admitting: Physician Assistant

## 2015-07-07 ENCOUNTER — Other Ambulatory Visit: Payer: Self-pay | Admitting: Physician Assistant

## 2015-07-23 ENCOUNTER — Telehealth: Payer: Self-pay | Admitting: Family Medicine

## 2015-07-23 ENCOUNTER — Encounter: Payer: Self-pay | Admitting: Family Medicine

## 2015-07-23 ENCOUNTER — Ambulatory Visit (INDEPENDENT_AMBULATORY_CARE_PROVIDER_SITE_OTHER): Payer: 59 | Admitting: Family Medicine

## 2015-07-23 VITALS — BP 148/99 | HR 90 | Temp 97.7°F | Ht 68.5 in | Wt 316.6 lb

## 2015-07-23 DIAGNOSIS — Z0000 Encounter for general adult medical examination without abnormal findings: Secondary | ICD-10-CM | POA: Diagnosis not present

## 2015-07-23 DIAGNOSIS — E785 Hyperlipidemia, unspecified: Secondary | ICD-10-CM | POA: Diagnosis not present

## 2015-07-23 DIAGNOSIS — E118 Type 2 diabetes mellitus with unspecified complications: Secondary | ICD-10-CM | POA: Diagnosis not present

## 2015-07-23 DIAGNOSIS — J301 Allergic rhinitis due to pollen: Secondary | ICD-10-CM | POA: Diagnosis not present

## 2015-07-23 DIAGNOSIS — F3340 Major depressive disorder, recurrent, in remission, unspecified: Secondary | ICD-10-CM

## 2015-07-23 DIAGNOSIS — E1165 Type 2 diabetes mellitus with hyperglycemia: Secondary | ICD-10-CM | POA: Diagnosis not present

## 2015-07-23 DIAGNOSIS — I10 Essential (primary) hypertension: Secondary | ICD-10-CM

## 2015-07-23 DIAGNOSIS — M549 Dorsalgia, unspecified: Secondary | ICD-10-CM

## 2015-07-23 DIAGNOSIS — G8929 Other chronic pain: Secondary | ICD-10-CM

## 2015-07-23 DIAGNOSIS — IMO0002 Reserved for concepts with insufficient information to code with codable children: Secondary | ICD-10-CM

## 2015-07-23 LAB — LIPID PANEL
CHOLESTEROL: 246 mg/dL — AB (ref 0–200)
HDL: 47.5 mg/dL (ref >39.00)
NonHDL: 198.88
TRIGLYCERIDES: 206 mg/dL — AB (ref 0.0–149.0)
Total CHOL/HDL Ratio: 5
VLDL: 41.2 mg/dL — AB (ref 0.0–40.0)

## 2015-07-23 LAB — HEMOGLOBIN A1C: Hgb A1c MFr Bld: 7.6 % — ABNORMAL HIGH (ref 4.6–6.5)

## 2015-07-23 LAB — LDL CHOLESTEROL, DIRECT: Direct LDL: 176 mg/dL

## 2015-07-23 MED ORDER — PRAVASTATIN SODIUM 40 MG PO TABS: ORAL_TABLET | ORAL | Status: DC

## 2015-07-23 MED ORDER — MONTELUKAST SODIUM 10 MG PO TABS
10.0000 mg | ORAL_TABLET | Freq: Every day | ORAL | Status: DC
Start: 2015-07-23 — End: 2016-05-05

## 2015-07-23 MED ORDER — HYDROCHLOROTHIAZIDE 25 MG PO TABS: 25.0000 mg | ORAL_TABLET | Freq: Every day | ORAL | Status: DC

## 2015-07-23 MED ORDER — AMLODIPINE BESYLATE 10 MG PO TABS: ORAL_TABLET | ORAL | Status: DC

## 2015-07-23 MED ORDER — LOSARTAN POTASSIUM 100 MG PO TABS: 100.0000 mg | ORAL_TABLET | Freq: Every day | ORAL | Status: DC

## 2015-07-23 MED ORDER — CITALOPRAM HYDROBROMIDE 40 MG PO TABS
ORAL_TABLET | ORAL | Status: DC
Start: 2015-07-23 — End: 2016-05-05

## 2015-07-23 MED ORDER — METFORMIN HCL 500 MG PO TABS
ORAL_TABLET | ORAL | Status: DC
Start: 2015-07-23 — End: 2017-02-18

## 2015-07-23 NOTE — Patient Instructions (Addendum)
Please call your insurnace company and ask them if they prefer a certain medication from the GLP-1 agonist class for diabetes (like trulicity). They may have a cheaper option for you!  I will be in touch with your labs asap  Take 1/2 of your celexa pill for 2 weeks, then go to the full pill For your blood pressure, take 1/2 of your HCTZ and losartan pills for 3-4 days, then go to the whole pill of these 2, then add the norvasc a few days later if your blood pressure is higher than 140/90  Try adding singulair for your allergies. If you are not already, also try an OTC nasal steroid such as flonase.   If the singulair is not helpful you do not have to continue to take it.

## 2015-07-23 NOTE — Progress Notes (Signed)
Pre visit review using our clinic review tool, if applicable. No additional management support is needed unless otherwise documented below in the visit note. 

## 2015-07-23 NOTE — Telephone Encounter (Signed)
Relationship to patient: Self  Can be reached: [phone]  Pharmacy:  [id number] - , [place]. [phone] (Phone) [phone] (Fax)         Reason for call: Patient called stating that all of her current medications were supposed to be called in following her appointment today. States when she got to the pharmacy, only one had been called in. Plse Adv

## 2015-07-23 NOTE — Progress Notes (Signed)
[place] [facility] [address] [place], [place] [address] [phone]  Date:  07/23/2015   Name:  Nicole Hubbard   DOB:  February 25, 1965   MRN:  [id number]  PCP:  Lamar Blinks, MD    Chief Complaint: Annual Exam   History of Present Illness:  Nicole Hubbard is a 50 y.o. very pleasant female patient who presents with the following:  Last seen by myself in September of 2016.  History of DM, obesity, HTN, depression She has a posterior lumbar laminectomy in March of this year.  She feels that she is getting better but she does still have some pain.   However overall she is pleased with her results  She has not been back to work since February- she left work due to her back pain.  She would like to RTW soon and plans to discuss with her NSG.   She does need me to update her FMLA paperwork that she uses for her mental health counseling.    She has been out of ALL of her medications for a little over a month  Lab Results  Component Value Date   HGBA1C 9.3* 03/14/2015   She was not able to afford her trulicity once the insurance year re-started.  However she has now fulfilled her deductible so this might be possible for her to use again  Her daughter Nicole Hubbard was diagnosed with HIV this spring, but she is doing well and Nicole Hubbard is hopeful for her future  She also notes itchy runny eyes, sneezing, runny nose.  She is using some sort of OTC allergy med- like benadryl Eye exam due at the end of next month.    Patient Active Problem List   Diagnosis Date Noted  . DDD (degenerative disc disease), lumbar 03/19/2015  . MDD (major depressive disorder), recurrent severe, without psychosis ([place]) 08/17/2013  . Morbid obesity ([place]) 06/30/2012  . Depression 01/26/2012  . Diabetes mellitus, type 2 ([place]) 01/26/2012  . HTN (hypertension) 01/26/2012    Past Medical History  Diagnosis Date  . Depression   . Diabetes mellitus without complication  ([place])   . Hypertension   . Chest pain   . Migraine   . Anxiety   . Complication of anesthesia     Pt stated "I gagged out after having ablation at [facility]"  . Sleep apnea     does not wear CPAP    Past Surgical History  Procedure Laterality Date  . Knee surgery    . Tonsillectomy    . Cervical ablation      Social History  Substance Use Topics  . Smoking status: Former Smoker    Types: Cigarettes    Quit date: 09/15/2008  . Smokeless tobacco: Never Used  . Alcohol Use: 1.2 oz/week    2 Glasses of wine per week     Comment: occasional    Family History  Problem Relation Age of Onset  . Cancer Father     urithrial    No Known Allergies  Medication list has been reviewed and updated.  Current Outpatient Prescriptions on File Prior to Visit  Medication Sig Dispense Refill  . amLODipine (NORVASC) 10 MG tablet TAKE 1 TABLET (10 MG TOTAL) BY MOUTH DAILY. 90 tablet 1  . blood glucose meter kit and supplies KIT Dispense based on patient and insurance preference. Use up to four times daily as directed. (FOR ICD-9 250.00, 250.01). Strips with  11 refills  Lancets with 11 refills 1 each 0  . citalopram (CELEXA) 40 MG tablet TAKE 1 TABLET BY MOUTH DAILY FOR DEPRESSION 30 tablet 0  . diazepam (VALIUM) 5 MG tablet Take 1-2 tablets (5-10 mg total) by mouth every 6 (six) hours as needed for muscle spasms. 60 tablet 0  . hydrochlorothiazide (HYDRODIURIL) 25 MG tablet TAKE 1 TABLET BY MOUTH DAILY FOR HIGH BLOOD PRESSURE 90 tablet 0  . losartan (COZAAR) 100 MG tablet Take 1 tablet (100 mg total) by mouth daily. for high blood pressure 30 tablet 1  . metFORMIN (GLUCOPHAGE) 500 MG tablet TAKE 1 TABLET BY MOUTH 2 TIMES DAILY WITH A MEAL FOR DIABETES MANAGEMENT 180 tablet 0  . pravastatin (PRAVACHOL) 40 MG tablet TAKE 1 TABLET (40 MG TOTAL) BY MOUTH DAILY. 30 tablet 0  . traMADol (ULTRAM) 50 MG tablet Take 50 mg by mouth every 6 (six) hours as needed for moderate pain. Reported on  07/23/2015     No current facility-administered medications on file prior to visit.    Review of Systems:  As per HPI- otherwise negative.   Physical Examination: Filed Vitals:   07/23/15 1311 07/23/15 1318  BP: 166/55 148/99  Pulse: 106   Temp: 97.7 F (36.5 C)    Filed Vitals:   07/23/15 1311  Height: 5' 8.5" (1.74 m)  Weight: 316 lb 9.6 oz (143.609 kg)   Body mass index is 47.43 kg/(m^2). Ideal Body Weight: Weight in (lb) to have BMI = 25: 166.5  GEN: WDWN, NAD, Non-toxic, A & O x 3, obese, looks well HEENT: Atraumatic, Normocephalic. Neck supple. No masses, No LAD.  Bilateral TM wnl, oropharynx normal.  PEERL,EOMI.   Ears and Nose: No external deformity. CV: RRR, No M/G/R. No JVD. No thrill. No extra heart sounds. PULM: CTA B, no wheezes, crackles, rhonchi. No retractions. No resp. distress. No accessory muscle use. ABD: S, NT, ND, +BS. No rebound. No HSM.  Wearing a back binder.  EXTR: No c/c/e NEURO Normal gait.  PSYCH: Normally interactive. Conversant. Not depressed or anxious appearing.  Calm demeanor.    Assessment and Plan: Physical exam  Allergic rhinitis due to pollen - Plan: montelukast (SINGULAIR) 10 MG tablet  Dyslipidemia - Plan: Lipid panel, pravastatin (PRAVACHOL) 40 MG tablet  Uncontrolled type 2 diabetes mellitus with complication, without long-term current use of insulin (HCC) - Plan: Hemoglobin A1c, metFORMIN (GLUCOPHAGE) 500 MG tablet  Essential hypertension - Plan: amLODipine (NORVASC) 10 MG tablet, hydrochlorothiazide (HYDRODIURIL) 25 MG tablet, losartan (COZAAR) 100 MG tablet  Chronic back pain  Morbid obesity, unspecified obesity type ([place])  Major depressive disorder, recurrent, in remission ([place]) - Plan: citalopram (CELEXA) 40 MG tablet   Here today for a CPE cologuard paperwork completed for her today Try adding sinuglair to her allergy regimen She has been off her medications for about one month- refilled these for her today Will  have her taper back on her BP and depression meds    Signed Lamar Blinks, MD

## 2015-07-23 NOTE — Telephone Encounter (Signed)
Taken care of

## 2015-08-02 ENCOUNTER — Encounter: Payer: Self-pay | Admitting: Family Medicine

## 2015-08-08 ENCOUNTER — Encounter: Payer: Self-pay | Admitting: Family Medicine

## 2015-08-09 ENCOUNTER — Telehealth: Payer: Self-pay

## 2015-08-09 NOTE — Telephone Encounter (Signed)
Cologuard order placed through online portal. Order number: [id number]. Order confirmation sent for scanning. Awaiting results.

## 2015-09-04 NOTE — Telephone Encounter (Signed)
Empty kit received via [employer] on 08/29/2015. They are attempting to call Pt for reorder.

## 2015-09-04 NOTE — Telephone Encounter (Signed)
Noted, thanks!

## 2015-09-26 ENCOUNTER — Encounter: Payer: Self-pay | Admitting: Family Medicine

## 2015-10-08 ENCOUNTER — Encounter: Payer: Self-pay | Admitting: Family Medicine

## 2015-10-11 LAB — COLOGUARD: Cologuard: NEGATIVE

## 2015-10-17 ENCOUNTER — Encounter: Payer: Self-pay | Admitting: Family Medicine

## 2015-10-22 ENCOUNTER — Ambulatory Visit: Payer: Self-pay | Admitting: Family Medicine

## 2015-11-07 ENCOUNTER — Encounter: Payer: Self-pay | Admitting: Family Medicine

## 2015-11-10 ENCOUNTER — Encounter: Payer: Self-pay | Admitting: Family Medicine

## 2015-11-14 ENCOUNTER — Telehealth: Payer: Self-pay | Admitting: Family Medicine

## 2015-11-14 NOTE — Telephone Encounter (Signed)
Pt dropped off a medical evaluation forms for Dr. Lorelei Pont to fill out, pt requesting forms are faxed back but she would also like to pick up a copy, documents placed in tray at front office

## 2015-11-19 ENCOUNTER — Encounter: Payer: Self-pay | Admitting: Family Medicine

## 2015-11-28 ENCOUNTER — Encounter: Payer: Self-pay | Admitting: Family Medicine

## 2015-11-29 ENCOUNTER — Encounter: Payer: Self-pay | Admitting: Family Medicine

## 2015-12-12 ENCOUNTER — Encounter: Payer: Self-pay | Admitting: Family Medicine

## 2016-04-13 NOTE — Progress Notes (Addendum)
[facility] [address] [place], [place] [address] [phone]  Date:  04/14/2016   Name:  Nicole Hubbard   DOB:  Jun 02, 1965   MRN:  [id number]  PCP:  Lamar Blinks, MD    Chief Complaint: Annual Exam   History of Present Illness:  Nicole Hubbard is a 51 y.o. very pleasant female patient who presents with the following:  Here today seeking her annual CPE- last seen by myself in July of last year  History of depression, obesity, HTN, DM Lab Results  Component Value Date   HGBA1C 7.6 (H) 07/23/2015  she had a normal pap with negative HPV in 10/15 Due for a foot exam Last eye exam:  BP Readings from Last 3 Encounters:  04/14/16 (!) 147/95  07/23/15 (!) 148/99  03/20/15 (!) 90/45   Wt Readings from Last 3 Encounters:  04/14/16 (!) 321 lb 6.4 oz (145.8 kg)  07/23/15 (!) 316 lb 9.6 oz (143.6 kg)  03/19/15 (!) 315 lb (142.9 kg)   Today she states that "I'm stressed, not break down stressed.  I'm just tired all the time, I have no energy at all to do anything at all except go to work and then come home. 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  She does snore We did a sleep study for her years ago and they did want to start CPAP but she did not want to do this at that time. However she would think about it now and would like to be tested again She gets her eyes checked annually- will continue to do this  She will need her FMLA paperwork filled out again soon-  She is fasting today for labs    Patient Active Problem List   Diagnosis Date Noted  . DDD (degenerative disc disease), lumbar 03/19/2015  . MDD (major depressive disorder), recurrent severe, without psychosis ([place]) 08/17/2013  . Morbid  obesity ([place]) 06/30/2012  . Depression 01/26/2012  . Diabetes mellitus, type 2 ([place]) 01/26/2012  . HTN (hypertension) 01/26/2012    Past Medical History:  Diagnosis Date  . Anxiety   . Chest pain   . Complication of anesthesia    Pt stated "I gagged out after having ablation at [facility]"  . Depression   . Diabetes mellitus without complication ([place])   . Hypertension   . Migraine   . Sleep apnea    does not wear CPAP    Past Surgical History:  Procedure Laterality Date  . CERVICAL ABLATION    . KNEE SURGERY    . TONSILLECTOMY      Social History  Substance Use Topics  . Smoking status: Former Smoker    Types: Cigarettes    Quit date: 09/15/2008  . Smokeless tobacco: Never Used  . Alcohol use 1.2 oz/week    2 Glasses of wine per week     Comment: occasional    Family History  Problem Relation Age of Onset  . Cancer Father     urithrial    No Known Allergies  Medication list has been reviewed and updated.  Current Outpatient Prescriptions on File Prior to Visit  Medication Sig Dispense Refill  .  amLODipine (NORVASC) 10 MG tablet TAKE 1 TABLET (10 MG TOTAL) BY MOUTH DAILY. 90 tablet 3  . blood glucose meter kit and supplies KIT Dispense based on patient and insurance preference. Use up to four times daily as directed. (FOR ICD-9 250.00, 250.01). Strips with 11 refills  Lancets with 11 refills 1 each 0  . citalopram (CELEXA) 40 MG tablet TAKE 1 TABLET BY MOUTH DAILY FOR DEPRESSION 90 tablet 3  . diazepam (VALIUM) 5 MG tablet Take 1-2 tablets (5-10 mg total) by mouth every 6 (six) hours as needed for muscle spasms. 60 tablet 0  . hydrochlorothiazide (HYDRODIURIL) 25 MG tablet Take 1 tablet (25 mg total) by mouth daily. for high blood pressure 90 tablet 3  . losartan (COZAAR) 100 MG tablet Take 1 tablet (100 mg total) by mouth daily. for high blood pressure 90 tablet 3  . metFORMIN (GLUCOPHAGE) 500 MG tablet TAKE 1 TABLET BY MOUTH 2 TIMES DAILY WITH A MEAL FOR  DIABETES MANAGEMENT 180 tablet 3  . montelukast (SINGULAIR) 10 MG tablet Take 1 tablet (10 mg total) by mouth at bedtime. 30 tablet 5  . pravastatin (PRAVACHOL) 40 MG tablet TAKE 1 TABLET (40 MG TOTAL) BY MOUTH DAILY. 90 tablet 3  . traMADol (ULTRAM) 50 MG tablet Take 50 mg by mouth every 6 (six) hours as needed for moderate pain. Reported on 07/23/2015     No current facility-administered medications on file prior to visit.     Review of Systems:  As per HPI- otherwise negative. Denies any intent of self harm No SE from her meds    Physical Examination: Vitals:   04/14/16 1358  BP: (!) 147/95  Pulse: 89  Temp: 98.9 F (37.2 C)   Vitals:   04/14/16 1358  Weight: (!) 321 lb 6.4 oz (145.8 kg)  Height: 5' 8.5" (1.74 m)   Body mass index is 48.16 kg/m. Ideal Body Weight: Weight in (lb) to have BMI = 25: 166.5  GEN: WDWN, NAD, Non-toxic, A & O x 3, very obese, otherwise looks well HEENT: Atraumatic, Normocephalic. Neck supple. No masses, No LAD.  Bilateral TM wnl, oropharynx normal.  PEERL,EOMI.   Few keloids on her skin Ears and Nose: No external deformity. CV: RRR, No M/G/R. No JVD. No thrill. No extra heart sounds. PULM: CTA B, no wheezes, crackles, rhonchi. No retractions. No resp. distress. No accessory muscle use. ABD: S, NT, ND, +BS. No rebound. No HSM. EXTR: No c/c/e NEURO Normal gait.  PSYCH: Normally interactive. Conversant. Not depressed or anxious appearing.  Calm demeanor.  Normal foot exam  Assessment and Plan: Physical exam  Diabetes mellitus type 2 in obese ([place]) - Plan: Comprehensive metabolic panel, Hemoglobin A1c  Dyslipidemia - Plan: Lipid panel  Morbid obesity, unspecified obesity type ([place])  Screening for deficiency anemia - Plan: CBC  Screening for thyroid disorder - Plan: TSH  Somnolence - Plan: Ambulatory referral to Neurology  Routine screening for STI (sexually transmitted infection) - Plan: HIV antibody, Hepatitis C antibody, RPR  Here  today for a CPE It sounds like she was dx with OSA in the past but has not been treated.  Her sx are now to the point that she would like treatment- will refer to neurology for evaluation Await labs as above and will be in touch with her results   Signed Lamar Blinks, MD  Received labs 4/3  Results for orders placed or performed in visit on 04/14/16  CBC  Result Value Ref Range  WBC 6.6 4.0 - 10.5 K/uL   RBC 4.64 3.87 - 5.11 Mil/uL   Platelets 262.0 150.0 - 400.0 K/uL   Hemoglobin 12.8 12.0 - 15.0 g/dL   HCT 39.3 36.0 - 46.0 %   MCV 84.8 78.0 - 100.0 fl   MCHC 32.5 30.0 - 36.0 g/dL   RDW 15.1 11.5 - 15.5 %  Comprehensive metabolic panel  Result Value Ref Range   Sodium 138 135 - 145 mEq/L   Potassium 3.8 3.5 - 5.1 mEq/L   Chloride 101 96 - 112 mEq/L   CO2 31 19 - 32 mEq/L   Glucose, Bld 254 (H) 70 - 99 mg/dL   BUN 7 6 - 23 mg/dL   Creatinine, Ser 0.79 0.40 - 1.20 mg/dL   Total Bilirubin 0.4 0.2 - 1.2 mg/dL   Alkaline Phosphatase 69 39 - 117 U/L   AST 12 0 - 37 U/L   ALT 14 0 - 35 U/L   Total Protein 8.2 6.0 - 8.3 g/dL   Albumin 4.2 3.5 - 5.2 g/dL   Calcium 9.6 8.4 - 10.5 mg/dL   GFR 98.38 >60.00 mL/min  Lipid panel  Result Value Ref Range   Cholesterol 218 (H) 0 - 200 mg/dL   Triglycerides 99.0 0.0 - 149.0 mg/dL   HDL 42.40 >39.00 mg/dL   VLDL 19.8 0.0 - 40.0 mg/dL   LDL Cholesterol 156 (H) 0 - 99 mg/dL   Total CHOL/HDL Ratio 5    NonHDL 175.92   TSH  Result Value Ref Range   TSH 1.28 0.35 - 4.50 uIU/mL  Hemoglobin A1c  Result Value Ref Range   Hgb A1c MFr Bld 10.9 (H) 4.6 - 6.5 %  HIV antibody  Result Value Ref Range   HIV 1&2 Ab, 4th Generation NONREACTIVE NONREACTIVE  Hepatitis C antibody  Result Value Ref Range   HCV Ab NEGATIVE NEGATIVE  RPR  Result Value Ref Range   RPR Ser Ql NON REAC NON REAC

## 2016-04-14 ENCOUNTER — Ambulatory Visit (INDEPENDENT_AMBULATORY_CARE_PROVIDER_SITE_OTHER): Payer: 59 | Admitting: Family Medicine

## 2016-04-14 VITALS — BP 147/95 | HR 89 | Temp 98.9°F | Ht 68.5 in | Wt 321.4 lb

## 2016-04-14 DIAGNOSIS — Z130 Encounter for screening for diseases of the blood and blood-forming organs and certain disorders involving the immune mechanism: Secondary | ICD-10-CM

## 2016-04-14 DIAGNOSIS — Z113 Encounter for screening for infections with a predominantly sexual mode of transmission: Secondary | ICD-10-CM | POA: Diagnosis not present

## 2016-04-14 DIAGNOSIS — Z0000 Encounter for general adult medical examination without abnormal findings: Secondary | ICD-10-CM | POA: Diagnosis not present

## 2016-04-14 DIAGNOSIS — Z1329 Encounter for screening for other suspected endocrine disorder: Secondary | ICD-10-CM

## 2016-04-14 DIAGNOSIS — E669 Obesity, unspecified: Secondary | ICD-10-CM

## 2016-04-14 DIAGNOSIS — R400 Somnolence: Secondary | ICD-10-CM | POA: Diagnosis not present

## 2016-04-14 DIAGNOSIS — E1169 Type 2 diabetes mellitus with other specified complication: Secondary | ICD-10-CM

## 2016-04-14 DIAGNOSIS — E785 Hyperlipidemia, unspecified: Secondary | ICD-10-CM

## 2016-04-14 LAB — LIPID PANEL
Cholesterol: 218 mg/dL — ABNORMAL HIGH (ref 0–200)
HDL: 42.4 mg/dL (ref >39.00)
LDL Cholesterol: 156 mg/dL — ABNORMAL HIGH (ref 0–99)
NonHDL: 175.92
TRIGLYCERIDES: 99 mg/dL (ref 0.0–149.0)
Total CHOL/HDL Ratio: 5
VLDL: 19.8 mg/dL (ref 0.0–40.0)

## 2016-04-14 LAB — COMPREHENSIVE METABOLIC PANEL
ALT: 14 U/L (ref 0–35)
AST: 12 U/L (ref 0–37)
Albumin: 4.2 g/dL (ref 3.5–5.2)
Alkaline Phosphatase: 69 U/L (ref 39–117)
BUN: 7 mg/dL (ref 6–23)
CALCIUM: 9.6 mg/dL (ref 8.4–10.5)
CHLORIDE: 101 meq/L (ref 96–112)
CO2: 31 meq/L (ref 19–32)
CREATININE: 0.79 mg/dL (ref 0.40–1.20)
GFR: 98.38 mL/min (ref >60.00)
Glucose, Bld: 254 mg/dL — ABNORMAL HIGH (ref 70–99)
Potassium: 3.8 mEq/L (ref 3.5–5.1)
SODIUM: 138 meq/L (ref 135–145)
Total Bilirubin: 0.4 mg/dL (ref 0.2–1.2)
Total Protein: 8.2 g/dL (ref 6.0–8.3)

## 2016-04-14 LAB — CBC
HCT: 39.3 % (ref 36.0–46.0)
Hemoglobin: 12.8 g/dL (ref 12.0–15.0)
MCHC: 32.5 g/dL (ref 30.0–36.0)
MCV: 84.8 fl (ref 78.0–100.0)
Platelets: 262 10*3/uL (ref 150.0–400.0)
RBC: 4.64 Mil/uL (ref 3.87–5.11)
RDW: 15.1 % (ref 11.5–15.5)
WBC: 6.6 10*3/uL (ref 4.0–10.5)

## 2016-04-14 LAB — TSH: TSH: 1.28 u[IU]/mL (ref 0.35–4.50)

## 2016-04-14 LAB — HEMOGLOBIN A1C: HEMOGLOBIN A1C: 10.9 % — AB (ref 4.6–6.5)

## 2016-04-14 NOTE — Patient Instructions (Signed)
It was good to see you today!  Take care and I will be in touch with your labs asap We are going to refer you to neurology to get a sleep evaluation for you; this will help [place] determine if you do have sleep apnea. Treating sleep apnea can be very helpful in improving your mood, energy level, and BP control

## 2016-04-15 ENCOUNTER — Encounter: Payer: Self-pay | Admitting: Family Medicine

## 2016-04-15 DIAGNOSIS — E1165 Type 2 diabetes mellitus with hyperglycemia: Principal | ICD-10-CM

## 2016-04-15 DIAGNOSIS — IMO0001 Reserved for inherently not codable concepts without codable children: Secondary | ICD-10-CM

## 2016-04-15 LAB — RPR

## 2016-04-15 LAB — HIV ANTIBODY (ROUTINE TESTING W REFLEX): HIV 1&2 Ab, 4th Generation: NONREACTIVE

## 2016-04-15 LAB — HEPATITIS C ANTIBODY: HCV Ab: NEGATIVE

## 2016-04-15 MED ORDER — DAPAGLIFLOZIN PROPANEDIOL 5 MG PO TABS: 5.0000 mg | ORAL_TABLET | Freq: Every day | ORAL | 5 refills | Status: DC

## 2016-04-28 ENCOUNTER — Encounter: Payer: Self-pay | Admitting: Family Medicine

## 2016-04-28 MED ORDER — FLUCONAZOLE 150 MG PO TABS: 150.0000 mg | ORAL_TABLET | Freq: Once | ORAL | 0 refills | Status: AC

## 2016-05-01 ENCOUNTER — Encounter: Payer: Self-pay | Admitting: Family Medicine

## 2016-05-05 ENCOUNTER — Encounter: Payer: Self-pay | Admitting: Neurology

## 2016-05-05 ENCOUNTER — Ambulatory Visit (INDEPENDENT_AMBULATORY_CARE_PROVIDER_SITE_OTHER): Payer: 59 | Admitting: Neurology

## 2016-05-05 VITALS — BP 153/89 | HR 92 | Ht 69.0 in | Wt 320.0 lb

## 2016-05-05 DIAGNOSIS — G4733 Obstructive sleep apnea (adult) (pediatric): Secondary | ICD-10-CM | POA: Insufficient documentation

## 2016-05-05 DIAGNOSIS — F5104 Psychophysiologic insomnia: Secondary | ICD-10-CM

## 2016-05-05 DIAGNOSIS — G479 Sleep disorder, unspecified: Secondary | ICD-10-CM

## 2016-05-05 MED ORDER — TRAZODONE HCL 150 MG PO TABS: 150.0000 mg | ORAL_TABLET | Freq: Every day | ORAL | 2 refills | Status: DC

## 2016-05-05 NOTE — Progress Notes (Signed)
Reason for visit: Sleep disorder  Referring physician: Dr. Clois Dupes is a 51 y.o. female  History of present illness:  [name] is a 51 year old right-handed black female with a history of obesity. The patient has a chronic history of fatigue and excessive daytime drowsiness. She indicates that over 10 years ago she had a sleep study that suggested she had sleep apnea. Since that time she has gained over 100 pounds in weight. The patient has had worsening problems with fatigue during the day, drowsiness, and a tendency to fall sleep if she is inactive. She does snore at night, she is not sure whether or not she has true apnea. The patient indicates that she will try to go to bed around 10:30, she may lay awake for an hour or 2, get to sleep and then wake up in the middle the night and cannot get back to sleep. She believes that she is only getting about 3 or 4 hours a sleep. Most of the night she is awake. Occasionally she may wake up with a headache 2 or 3 times a week on average. The patient denies any issues with memory or concentration. She denies any numbness or weakness of the face, arms, or legs. She denies any excessive drowsiness with driving. He denies any balance issues or difficulty controlling the bowels or the bladder. She does have a history of chronic back pain, she has had prior back surgery. The back pain may be a factor in her insomnia. She is sent to this office for an evaluation. In the past, she has taken Ambien which allows her to feel rested the next day when she takes it.  Past Medical History:  Diagnosis Date  . Anxiety   . Chest pain   . Complication of anesthesia    Pt stated "I gagged out after having ablation at [facility]"  . Depression   . Diabetes mellitus without complication ([place])   . Hypertension   . Migraine   . Sleep apnea    does not wear CPAP    Past Surgical History:  Procedure Laterality Date  . CERVICAL ABLATION    . KNEE  SURGERY    . TONSILLECTOMY      Family History  Problem Relation Age of Onset  . Cancer Father     urithrial    Social history:  reports that she quit smoking about 7 years ago. Her smoking use included Cigarettes. She has never used smokeless tobacco. She reports that she drinks about 1.2 oz of alcohol per week . She reports that she does not use drugs.  Medications:  Prior to Admission medications   Medication Sig Start Date End Date Taking? Authorizing Provider  amLODipine (NORVASC) 10 MG tablet TAKE 1 TABLET (10 MG TOTAL) BY MOUTH DAILY. 07/23/15  Yes Gay Filler Copland, MD  blood glucose meter kit and supplies KIT Dispense based on patient and insurance preference. Use up to four times daily as directed. (FOR ICD-9 250.00, 250.01). Strips with 11 refills  Lancets with 11 refills 06/15/14  Yes Shawnee Knapp, MD  citalopram (CELEXA) 40 MG tablet TAKE 1 TABLET BY MOUTH DAILY FOR DEPRESSION 07/23/15  Yes Gay Filler Copland, MD  dapagliflozin propanediol (FARXIGA) 5 MG TABS tablet Take 5 mg by mouth daily. 04/15/16  Yes Gay Filler Copland, MD  hydrochlorothiazide (HYDRODIURIL) 25 MG tablet Take 1 tablet (25 mg total) by mouth daily. for high blood pressure 07/23/15  Yes Janett Billow  C Copland, MD  losartan (COZAAR) 100 MG tablet Take 1 tablet (100 mg total) by mouth daily. for high blood pressure 07/23/15  Yes Jessica C Copland, MD  metFORMIN (GLUCOPHAGE) 500 MG tablet TAKE 1 TABLET BY MOUTH 2 TIMES DAILY WITH A MEAL FOR DIABETES MANAGEMENT 07/23/15  Yes Jessica C Copland, MD  pravastatin (PRAVACHOL) 40 MG tablet TAKE 1 TABLET (40 MG TOTAL) BY MOUTH DAILY. 07/23/15  Yes Gay Filler Copland, MD     No Known Allergies  ROS:  Out of a complete 14 system review of symptoms, the patient complains only of the following symptoms, and all other reviewed systems are negative.  Weight gain, fatigue Snoring Increased thirst Depression, not enough sleep Sleepiness  Blood pressure (!) 153/89, pulse 92, height 5'  9" (1.753 m), weight (!) 320 lb (145.2 kg), last menstrual period 02/03/2014.  Physical Exam  General: The patient is alert and cooperative at the time of the examination. The patient is morbidly obese.  Eyes: Pupils are equal, round, and reactive to light. Discs are flat bilaterally.  Neck: The neck is supple, no carotid bruits are noted.  Respiratory: The respiratory examination is clear.  Cardiovascular: The cardiovascular examination reveals a regular rate and rhythm, no obvious murmurs or rubs are noted.  Skin: Extremities are without significant edema.  Neurologic Exam  Mental status: The patient is alert and oriented x 3 at the time of the examination. The patient has apparent normal recent and remote memory, with an apparently normal attention span and concentration ability.  Cranial nerves: Facial symmetry is present. There is good sensation of the face to pinprick and soft touch bilaterally. The strength of the facial muscles and the muscles to head turning and shoulder shrug are normal bilaterally. Speech is well enunciated, no aphasia or dysarthria is noted. Extraocular movements are full. Visual fields are full. The tongue is midline, and the patient has symmetric elevation of the soft palate. No obvious hearing deficits are noted.  Motor: The motor testing reveals 5 over 5 strength of all 4 extremities. Good symmetric motor tone is noted throughout.  Sensory: Sensory testing is intact to pinprick, soft touch, vibration sensation, and position sense on all 4 extremities. No evidence of extinction is noted.  Coordination: Cerebellar testing reveals good finger-nose-finger and heel-to-shin bilaterally.  Gait and station: Gait is normal. Tandem gait is normal. Romberg is negative. No drift is seen.  Reflexes: Deep tendon reflexes are symmetric and normal bilaterally. Toes are downgoing bilaterally.   Assessment/Plan:  1. Sleep disorder, chronic insomnia  The patient may  snore at night, but it appears that her main issue is that she has chronic insomnia. The patient claims that when she takes Ambien this helps her feel rested the next morning. We will try trazodone starting at 150 mg at night, going up to 300 mg at needed. She will stop the Celexa while on the trazodone. The patient will follow-up in 3 months. If insomnia is the primary issue, a sleep evaluation is not indicated.  Jill Alexanders MD 05/05/2016 2:15 PM  [facility] [address] [place], [place] [address]  Phone [phone] Fax [phone]

## 2016-05-05 NOTE — Patient Instructions (Signed)
   Stop the celexa and go on Trazodone 150 mg at night, call for any dose adjustments.

## 2016-05-06 ENCOUNTER — Encounter: Payer: Self-pay | Admitting: Family Medicine

## 2016-05-16 ENCOUNTER — Encounter: Payer: Self-pay | Admitting: Family Medicine

## 2016-05-16 MED ORDER — ALBUTEROL SULFATE HFA 108 (90 BASE) MCG/ACT IN AERS: 1.0000 | INHALATION_SPRAY | Freq: Four times a day (QID) | RESPIRATORY_TRACT | 5 refills | Status: AC | PRN

## 2016-05-19 ENCOUNTER — Telehealth: Payer: Self-pay | Admitting: Family Medicine

## 2016-05-19 ENCOUNTER — Encounter: Payer: Self-pay | Admitting: Family Medicine

## 2016-05-19 NOTE — Telephone Encounter (Signed)
Pt brought job medical accommodations form for dr copland. Put in front office bin.

## 2016-05-20 NOTE — Telephone Encounter (Signed)
Received FMLA/Job Accommodation Form from [employer], forwarded to provider/SLS 05/08

## 2016-05-23 ENCOUNTER — Encounter: Payer: Self-pay | Admitting: Family Medicine

## 2016-05-23 DIAGNOSIS — IMO0002 Reserved for concepts with insufficient information to code with codable children: Secondary | ICD-10-CM

## 2016-05-23 DIAGNOSIS — E1165 Type 2 diabetes mellitus with hyperglycemia: Secondary | ICD-10-CM

## 2016-05-23 DIAGNOSIS — E118 Type 2 diabetes mellitus with unspecified complications: Principal | ICD-10-CM

## 2016-05-27 ENCOUNTER — Encounter: Payer: Self-pay | Admitting: Family Medicine

## 2016-05-27 ENCOUNTER — Other Ambulatory Visit: Payer: Self-pay | Admitting: Family Medicine

## 2016-05-27 DIAGNOSIS — B3731 Acute candidiasis of vulva and vagina: Secondary | ICD-10-CM

## 2016-05-27 DIAGNOSIS — B373 Candidiasis of vulva and vagina: Secondary | ICD-10-CM

## 2016-05-27 MED ORDER — FLUCONAZOLE 150 MG PO TABS: 150.0000 mg | ORAL_TABLET | Freq: Once | ORAL | 0 refills | Status: AC

## 2016-05-28 MED ORDER — DULAGLUTIDE 0.75 MG/0.5ML ~~LOC~~ SOAJ: SUBCUTANEOUS | 11 refills | Status: DC

## 2016-05-28 NOTE — Telephone Encounter (Signed)
Pt has not been able to tolerate farxiga due to vaginal yeast infections.  She is willing to try trulicity-  4/2 Lab Results  Component Value Date   HGBA1C 10.9 (H) 04/14/2016   She is also on metformin  Will rx trulicity for her

## 2016-05-28 NOTE — Addendum Note (Signed)
Addended by: Lamar Blinks C on: 05/28/2016 04:52 PM   Modules accepted: Orders

## 2016-06-23 ENCOUNTER — Telehealth: Payer: Self-pay | Admitting: *Deleted

## 2016-06-23 NOTE — Telephone Encounter (Signed)
Called and LVM for pt to call. Advised we received refill request for trazodone 150mg  tablet at bed. Wanted to know how she was doing on this dose. Per last office note, Dr Jannifer Franklin discussed going up to 300mg  as needed. Asked her to call back and let me know.

## 2016-07-01 NOTE — Telephone Encounter (Signed)
Called and LVM again for pt to call re refill request for trazodone. Gave [facility] phone number.

## 2016-07-07 MED ORDER — TRAZODONE HCL 150 MG PO TABS: 150.0000 mg | ORAL_TABLET | Freq: Every day | ORAL | 2 refills | Status: DC

## 2016-07-07 NOTE — Telephone Encounter (Signed)
Dr Jannifer Franklin- I tried to contact patient about this. Unable to reach. How would you like to refill medication?

## 2016-07-07 NOTE — Addendum Note (Signed)
Addended by: Kathrynn Ducking on: 07/07/2016 06:09 PM   Modules accepted: Orders

## 2016-07-07 NOTE — Telephone Encounter (Signed)
I called patient, try to find out whether or not the current dose of trazodone is effective, I was able to leave a message, for now I will call in the 150 mg trazodone tablets, if the dose is not effective, she is to call his back and let us know.

## 2016-07-08 ENCOUNTER — Other Ambulatory Visit: Payer: Self-pay | Admitting: Family Medicine

## 2016-07-08 DIAGNOSIS — E785 Hyperlipidemia, unspecified: Secondary | ICD-10-CM

## 2016-07-08 DIAGNOSIS — I10 Essential (primary) hypertension: Secondary | ICD-10-CM

## 2016-07-08 DIAGNOSIS — F3340 Major depressive disorder, recurrent, in remission, unspecified: Secondary | ICD-10-CM

## 2016-07-08 NOTE — Addendum Note (Signed)
Addended by: Kathrynn Ducking on: 07/08/2016 02:23 PM   Modules accepted: Orders

## 2016-07-08 NOTE — Telephone Encounter (Signed)
Patient called office back. She stated she stopping taking trazodone because it was making her too drowsy. 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 She usually does not take it until 10pm and has to get up at 6am. She was unable to tolerate 150mg . She would like to know if there are any other options. Advised I will send request to CW,MD. We will call back to advise.

## 2016-07-08 NOTE — Telephone Encounter (Signed)
Patient states she is not getting Belsomra Rx.

## 2016-07-08 NOTE — Telephone Encounter (Signed)
I called patient. The patient has decided not use anything for sleeping, if she desires, we can cut back to a lower dose taking 50 or 100 mg at night of the trazodone rather than 150.  She will let me know about this.

## 2016-07-08 NOTE — Telephone Encounter (Signed)
I called patient, left a message. She now makes it clear that the trazodone at 150 mg dose was not well-tolerated secondary to excessive drowsiness the next day.  May try Belsomra for sleep if she desires, she will call our office.

## 2016-08-04 ENCOUNTER — Ambulatory Visit: Payer: 59 | Admitting: Adult Health

## 2016-08-19 ENCOUNTER — Encounter: Payer: Self-pay | Admitting: Family Medicine

## 2016-08-19 MED ORDER — CYCLOBENZAPRINE HCL 10 MG PO TABS: 10.0000 mg | ORAL_TABLET | Freq: Two times a day (BID) | ORAL | 0 refills | Status: DC | PRN

## 2016-08-24 MED ORDER — CYCLOBENZAPRINE HCL 10 MG PO TABS: 10.0000 mg | ORAL_TABLET | Freq: Two times a day (BID) | ORAL | 0 refills | Status: DC | PRN

## 2016-08-24 NOTE — Addendum Note (Signed)
Addended by: Lamar Blinks C on: 08/24/2016 03:58 PM   Modules accepted: Orders

## 2016-09-01 ENCOUNTER — Telehealth: Payer: Self-pay | Admitting: Family Medicine

## 2016-09-01 NOTE — Telephone Encounter (Signed)
Pt dropped off document to be filled out by provider (Job Accommodations in a yellow big envelope). Please fill out and when ready to have it faxed and mailed to the pt. Document put at front office tray.

## 2016-09-05 NOTE — Telephone Encounter (Signed)
Forms received.  Filled in as much as possible and forwarded to Dr. Lorelei Pont for review and completion.

## 2016-09-09 ENCOUNTER — Encounter: Payer: Self-pay | Admitting: Family Medicine

## 2016-12-22 ENCOUNTER — Ambulatory Visit: Payer: Self-pay | Admitting: Family Medicine

## 2017-01-30 ENCOUNTER — Other Ambulatory Visit: Payer: Self-pay | Admitting: Family Medicine

## 2017-02-02 MED ORDER — CYCLOBENZAPRINE HCL 10 MG PO TABS: 10.0000 mg | ORAL_TABLET | Freq: Two times a day (BID) | ORAL | 0 refills | Status: DC | PRN

## 2017-02-02 NOTE — Telephone Encounter (Signed)
Request refill on cyclobenzaprine (FLEXERIL) 10 MG tablet. Last office visit 04/14/16 and last refill 08/24/16. Is it ok to refill? Please advise.

## 2017-02-17 NOTE — Progress Notes (Addendum)
[facility] [address], [place], [place] [address] [phone]  Date:  02/18/2017   Name:  Nicole Hubbard   DOB:  1965-06-09   MRN:  [id number]  PCP:  Darreld Mclean, MD    Chief Complaint: Menopause   History of Present Illness:  Nicole Hubbard is a 52 y.o. very pleasant female patient who presents with the following:  Here today with concern of menopausal sx I have not seen her since last April  Also history of obesity, depression, DM, HTN  She went to an UC about 3 weeks ago- she had a cold, but also had vaginal discomfort.  She then went back a few days later as her vaginal sx continued-  did a urine, also also a self wet prep. She was called the next day- was told that she had an STD but they did not tell her what it was- she was given flagyl so ?trich.  However her sx are not any better even after finishing her flagyl course  She feels like she had a yeast infection perhaps- she has a lot of itching She has also noted a thick white discharge, no odor  She is not using her metformin as it bothered her stomach too much- it was making her vomit She stopped taking it a couple of weeks ago   Lab Results  Component Value Date   HGBA1C 10.9 (H) 04/14/2016   A1c is due Flu shot:  Do today  She is due for a pap today  She also has noted some firm and slightly tender lumps in her right upper quadrant- present for 6-8 months. She is a bit worried about this, would like me to look at these today    She also wonders if she is in menopause due to hot flashes No menses due to ablation about 5 years ago She did have a little bleeding about 2 weeks ago however  Patient Active Problem List   Diagnosis Date Noted  . Sleep disorder 05/05/2016  . Chronic insomnia 05/05/2016  . DDD (degenerative disc disease), lumbar 03/19/2015  . MDD (major depressive disorder), recurrent severe, without psychosis ([employer]) 08/17/2013  .  Morbid obesity ([place]) 06/30/2012  . Depression 01/26/2012  . Diabetes mellitus, type 2 ([place]) 01/26/2012  . HTN (hypertension) 01/26/2012    Past Medical History:  Diagnosis Date  . Anxiety   . Chest pain   . Complication of anesthesia    Pt stated "I gagged out after having ablation at [facility]"  . Depression   . Diabetes mellitus without complication ([place])   . Hypertension   . Migraine   . Sleep apnea    does not wear CPAP    Past Surgical History:  Procedure Laterality Date  . CERVICAL ABLATION    . KNEE SURGERY    . TONSILLECTOMY      Social History   Tobacco Use  . Smoking status: Former Smoker    Types: Cigarettes    Last attempt to quit: 09/15/2008    Years since quitting: 8.4  . Smokeless tobacco: Never Used  Substance Use Topics  . Alcohol use: Yes    Alcohol/week: 1.2 oz    Types: 2 Glasses of wine per week    Comment: occasional  . Drug use: No    Family History  Problem Relation Age of Onset  . Cancer Father  urithrial    No Known Allergies  Medication list has been reviewed and updated.  Current Outpatient Medications on File Prior to Visit  Medication Sig Dispense Refill  . albuterol (PROVENTIL HFA;VENTOLIN HFA) 108 (90 Base) MCG/ACT inhaler Inhale 1-2 puffs into the lungs every 6 (six) hours as needed for wheezing or shortness of breath. 18 g 5  . amLODipine (NORVASC) 10 MG tablet TAKE 1 TABLET (10 MG TOTAL) BY MOUTH DAILY. 90 tablet 3  . blood glucose meter kit and supplies KIT Dispense based on patient and insurance preference. Use up to four times daily as directed. (FOR ICD-9 250.00, 250.01). Strips with 11 refills  Lancets with 11 refills 1 each 0  . citalopram (CELEXA) 40 MG tablet TAKE 1 TABLET BY MOUTH DAILY FOR DEPRESSION 90 tablet 3  . cyclobenzaprine (FLEXERIL) 10 MG tablet Take 1 tablet (10 mg total) by mouth 2 (two) times daily as needed for muscle spasms. 30 tablet 0  . Dulaglutide (TRULICITY) 8.31 DV/7.6HY SOPN  Inject 0.75 subcue once a week 4 pen 11  . hydrochlorothiazide (HYDRODIURIL) 25 MG tablet Take 1 tablet (25 mg total) by mouth daily. for high blood pressure 90 tablet 3  . losartan (COZAAR) 100 MG tablet TAKE 1 TABLET (100 MG TOTAL) BY MOUTH DAILY. FOR HIGH BLOOD PRESSURE 90 tablet 3  . pravastatin (PRAVACHOL) 40 MG tablet TAKE 1 TABLET (40 MG TOTAL) BY MOUTH DAILY. 90 tablet 3   No current facility-administered medications on file prior to visit.     Review of Systems:  As per HPI- otherwise negative. No fever or chills   Physical Examination: Vitals:   02/18/17 1503  BP: 138/86  Pulse: 93  Resp: 16  Temp: 97.9 F (36.6 C)  SpO2: 96%   Vitals:   02/18/17 1503  Weight: (!) 305 lb (138.3 kg)  Height: _0  (1.727 m)   Body mass index is 46.38 kg/m. Ideal Body Weight: Weight in (lb) to have BMI = 25: 164.1  GEN: WDWN, NAD, Non-toxic, A & O x 3, obese, otherwise looks well HEENT: Atraumatic, Normocephalic. Neck supple. No masses, No LAD. Ears and Nose: No external deformity. CV: RRR, No M/G/R. No JVD. No thrill. No extra heart sounds. PULM: CTA B, no wheezes, crackles, rhonchi. No retractions. No resp. distress. No accessory muscle use. ABD: S, NT, ND, +BS. No rebound. No HSM.  There are a couple of firm, superficial feeling masses in her RUQ- maybe cysts or lipomas EXTR: No c/c/e NEURO Normal gait.  PSYCH: Normally interactive. Conversant. Not depressed or anxious appearing.  Calm demeanor.  Pelvic: external vulva appears a bit inflamed- ?may be yeast vaginitis. no vaginal lesions or discharge. Uterus normal, no CMT, no adnexal tendereness or masses  Assessment and Plan: Uncontrolled type 2 diabetes mellitus with complication, without long-term current use of insulin ([place]) - Plan: Basic metabolic panel, Hemoglobin A1c  Dyslipidemia  Morbid obesity, unspecified obesity type ([place])  Screening for cervical cancer - Plan: Cytology - PAP  Vaginal itching - Plan: Cytology  - PAP, fluconazole (DIFLUCAN) 150 MG tablet  Hot flashes - Plan: FSH/LH  Right upper quadrant abdominal mass - Plan: US Abdomen Limited RUQ  Needs flu shot - Plan: Flu Vaccine QUAD 6+ mos PF IM (Fluarix Quad PF)  A few concerns today She is not able to take metformin- check a1c, will need to find a new treatment for her Pap and gc/ cmz, yeast, trich, BV all pending Treat for likely yeast infection with  diflucan Flu shot today RUQ [place] ordered for her  Signed Lamar Blinks, MD  2/7- Received her labs so far, await her pap, etc  Still waiting on your pap, etc.  Your FSH is in the middle between non- menopausal and menopausal.   I am a bit concerned about the bleeding that you had a couple of weeks ago, and would like to have you see GYN to make sure all is well.  I will make this referral for you  Your A1c is very, very high.  Are you still using the trulicity?  I think we need to start you on a once a day insulin to try and get this under control. Please let me know if this is ok with you and we can get started JC  Results for orders placed or performed in visit on 02/18/17  FSH/LH  Result Value Ref Range   FSH 20.6 mIU/mL   LH 13.8 mIU/mL  Basic metabolic panel  Result Value Ref Range   Sodium 136 135 - 145 mEq/L   Potassium 4.0 3.5 - 5.1 mEq/L   Chloride 96 96 - 112 mEq/L   CO2 30 19 - 32 mEq/L   Glucose, Bld 346 (H) 70 - 99 mg/dL   BUN 8 6 - 23 mg/dL   Creatinine, Ser 0.80 0.40 - 1.20 mg/dL   Calcium 9.6 8.4 - 10.5 mg/dL   GFR 96.64 >60.00 mL/min  Hemoglobin A1c  Result Value Ref Range   Hgb A1c MFr Bld 14.8 (H) 4.6 - 6.5 %   2/8 Called her to discuss plans for insulin - LMOM

## 2017-02-18 ENCOUNTER — Ambulatory Visit (INDEPENDENT_AMBULATORY_CARE_PROVIDER_SITE_OTHER): Payer: 59 | Admitting: Family Medicine

## 2017-02-18 ENCOUNTER — Encounter: Payer: Self-pay | Admitting: Family Medicine

## 2017-02-18 ENCOUNTER — Other Ambulatory Visit (HOSPITAL_COMMUNITY)
Admission: RE | Admit: 2017-02-18 | Discharge: 2017-02-18 | Disposition: A | Discharge status: Home / Self Care | Payer: 59 | Source: Ambulatory Visit | Attending: Family Medicine | Admitting: Family Medicine

## 2017-02-18 VITALS — BP 138/86 | HR 93 | Temp 97.9°F | Resp 16 | Ht 68.0 in | Wt 305.0 lb

## 2017-02-18 DIAGNOSIS — R232 Flushing: Secondary | ICD-10-CM | POA: Insufficient documentation

## 2017-02-18 DIAGNOSIS — Z124 Encounter for screening for malignant neoplasm of cervix: Secondary | ICD-10-CM | POA: Diagnosis not present

## 2017-02-18 DIAGNOSIS — R1901 Right upper quadrant abdominal swelling, mass and lump: Secondary | ICD-10-CM

## 2017-02-18 DIAGNOSIS — E118 Type 2 diabetes mellitus with unspecified complications: Secondary | ICD-10-CM | POA: Diagnosis not present

## 2017-02-18 DIAGNOSIS — Z23 Encounter for immunization: Secondary | ICD-10-CM | POA: Diagnosis not present

## 2017-02-18 DIAGNOSIS — N950 Postmenopausal bleeding: Secondary | ICD-10-CM | POA: Diagnosis not present

## 2017-02-18 DIAGNOSIS — E1165 Type 2 diabetes mellitus with hyperglycemia: Secondary | ICD-10-CM | POA: Insufficient documentation

## 2017-02-18 DIAGNOSIS — N898 Other specified noninflammatory disorders of vagina: Secondary | ICD-10-CM | POA: Insufficient documentation

## 2017-02-18 DIAGNOSIS — IMO0002 Reserved for concepts with insufficient information to code with codable children: Secondary | ICD-10-CM

## 2017-02-18 DIAGNOSIS — E785 Hyperlipidemia, unspecified: Secondary | ICD-10-CM

## 2017-02-18 MED ORDER — FLUCONAZOLE 150 MG PO TABS: 150.0000 mg | ORAL_TABLET | Freq: Once | ORAL | 0 refills | Status: AC

## 2017-02-18 NOTE — Patient Instructions (Addendum)
It was good to see you again today- we are going to treat you for a likely yeast infection with diflucan by mouth- take one, and repeat in a week if needed.  This medication can interact with your celexa. Take a 1/2 tablet of celexa daily while you are on the diflucan  I will be in touch with your other results asap. We will come up with another plan for treating your diabetes  I will set up an ultrasound of your abdomen to look at the small bumps you have noticed  You got your flu shot today

## 2017-02-19 ENCOUNTER — Encounter: Payer: Self-pay | Admitting: Family Medicine

## 2017-02-19 LAB — BASIC METABOLIC PANEL
BUN: 8 mg/dL (ref 6–23)
CALCIUM: 9.6 mg/dL (ref 8.4–10.5)
CO2: 30 meq/L (ref 19–32)
Chloride: 96 mEq/L (ref 96–112)
Creatinine, Ser: 0.8 mg/dL (ref 0.40–1.20)
GFR: 96.64 mL/min (ref >60.00)
Glucose, Bld: 346 mg/dL — ABNORMAL HIGH (ref 70–99)
Potassium: 4 mEq/L (ref 3.5–5.1)
SODIUM: 136 meq/L (ref 135–145)

## 2017-02-19 LAB — HEMOGLOBIN A1C: Hgb A1c MFr Bld: 14.8 % — ABNORMAL HIGH (ref 4.6–6.5)

## 2017-02-19 LAB — FSH/LH
FSH: 20.6 m[IU]/mL
LH: 13.8 m[IU]/mL

## 2017-02-19 NOTE — Addendum Note (Signed)
Addended by: Lamar Blinks C on: 02/19/2017 12:47 PM   Modules accepted: Orders

## 2017-02-21 LAB — CYTOLOGY - PAP
BACTERIAL VAGINITIS: NEGATIVE
CANDIDA VAGINITIS: POSITIVE — AB
Chlamydia: NEGATIVE
DIAGNOSIS: REACTIVE
Diagnosis: NEGATIVE
HPV (WINDOPATH): NOT DETECTED
NEISSERIA GONORRHEA: NEGATIVE
Trichomonas: NEGATIVE

## 2017-02-22 ENCOUNTER — Ambulatory Visit (HOSPITAL_BASED_OUTPATIENT_CLINIC_OR_DEPARTMENT_OTHER)
Admission: RE | Admit: 2017-02-22 | Discharge: 2017-02-22 | Disposition: A | Discharge status: Home / Self Care | Payer: 59 | Source: Ambulatory Visit | Attending: Family Medicine | Admitting: Family Medicine

## 2017-02-22 ENCOUNTER — Other Ambulatory Visit: Payer: Self-pay | Admitting: Family Medicine

## 2017-02-22 DIAGNOSIS — R1901 Right upper quadrant abdominal swelling, mass and lump: Secondary | ICD-10-CM

## 2017-02-22 DIAGNOSIS — I10 Essential (primary) hypertension: Secondary | ICD-10-CM

## 2017-02-23 MED ORDER — BASAGLAR KWIKPEN 100 UNIT/ML ~~LOC~~ SOPN: PEN_INJECTOR | SUBCUTANEOUS | 6 refills | Status: DC

## 2017-02-23 NOTE — Addendum Note (Signed)
Addended by: Lamar Blinks C on: 02/23/2017 08:51 AM   Modules accepted: Orders

## 2017-02-24 NOTE — Progress Notes (Signed)
[place] [facility] [address] [place], [place] [address] [phone]  Date:  02/25/2017   Name:  Nicole SHINGLEDECKER   DOB:  Jul 08, 1965   MRN:  [id number]  PCP:  Darreld Mclean, MD    Chief Complaint: Diabetic Information (medication instruction)   History of Present Illness:  Nicole Hubbard is a 52 y.o. very pleasant female patient who presents with the following:  Nicole Hubbard is here today to go over starting once a day insulin glargine.  She has not used this in the past.  Her A1c is quite high and we had to stop metformin due to persistent GI side effects Lab Results  Component Value Date   HGBA1C 14.8 (H) 02/18/2017   She is also using trulicity Other health concerns include obesity, HTN, depression  She has changed her diet and lost 2 lbs so far!    She is no longer taking trulicity due to cost May add some glipizide once her sugars are reasonable   Patient Active Problem List   Diagnosis Date Noted  . Sleep disorder 05/05/2016  . Chronic insomnia 05/05/2016  . DDD (degenerative disc disease), lumbar 03/19/2015  . MDD (major depressive disorder), recurrent severe, without psychosis ([name]) 08/17/2013  . Morbid obesity ([name]) 06/30/2012  . Depression 01/26/2012  . Diabetes mellitus, type 2 ([name]) 01/26/2012  . HTN (hypertension) 01/26/2012    Past Medical History:  Diagnosis Date  . Anxiety   . Chest pain   . Complication of anesthesia    Pt stated "I gagged out after having ablation at [facility]"  . Depression   . Diabetes mellitus without complication ([name])   . Hypertension   . Migraine   . Sleep apnea    does not wear CPAP    Past Surgical History:  Procedure Laterality Date  . CERVICAL ABLATION    . KNEE SURGERY    . TONSILLECTOMY      Social History   Tobacco Use  . Smoking status: Former Smoker    Types: Cigarettes    Last attempt to quit: 09/15/2008    Years since quitting: 8.4  . Smokeless  tobacco: Never Used  Substance Use Topics  . Alcohol use: Yes    Alcohol/week: 1.2 oz    Types: 2 Glasses of wine per week    Comment: occasional  . Drug use: No    Family History  Problem Relation Age of Onset  . Cancer Father        urithrial    Allergies  Allergen Reactions  . Metformin And Related     Pt stopped due to persistent vomiting    Medication list has been reviewed and updated.  Current Outpatient Medications on File Prior to Visit  Medication Sig Dispense Refill  . albuterol (PROVENTIL HFA;VENTOLIN HFA) 108 (90 Base) MCG/ACT inhaler Inhale 1-2 puffs into the lungs every 6 (six) hours as needed for wheezing or shortness of breath. 18 g 5  . amLODipine (NORVASC) 10 MG tablet TAKE 1 TABLET (10 MG TOTAL) BY MOUTH DAILY. 90 tablet 3  . blood glucose meter kit and supplies KIT Dispense based on patient and insurance preference. Use up to four times daily as directed. (FOR ICD-9 250.00, 250.01). Strips with 11 refills  Lancets with 11 refills 1 each 0  . citalopram (CELEXA) 40 MG tablet TAKE 1 TABLET BY MOUTH DAILY FOR DEPRESSION 90 tablet 3  . cyclobenzaprine (FLEXERIL)  10 MG tablet Take 1 tablet (10 mg total) by mouth 2 (two) times daily as needed for muscle spasms. 30 tablet 0  . hydrochlorothiazide (HYDRODIURIL) 25 MG tablet TAKE 1 TABLET (25 MG TOTAL) BY MOUTH DAILY. FOR HIGH BLOOD PRESSURE 90 tablet 2  . Insulin Glargine (BASAGLAR KWIKPEN) 100 UNIT/ML SOPN Start with 10 units daily and titrate as directed 15 mL 6  . losartan (COZAAR) 100 MG tablet TAKE 1 TABLET (100 MG TOTAL) BY MOUTH DAILY. FOR HIGH BLOOD PRESSURE 90 tablet 3  . pravastatin (PRAVACHOL) 40 MG tablet TAKE 1 TABLET (40 MG TOTAL) BY MOUTH DAILY. 90 tablet 3   No current facility-administered medications on file prior to visit.     Review of Systems:  As per HPI- otherwise negative.   Physical Examination: Vitals:   02/25/17 1433  BP: 122/80  Pulse: 97  Resp: 16  Temp: 98.4 F (36.9 C)   SpO2: 95%   Vitals:   02/25/17 1433  Weight: (!) 303 lb 12.8 oz (137.8 kg)  Height: _0  (1.727 m)   Body mass index is 46.19 kg/m. Ideal Body Weight: Weight in (lb) to have BMI = 25: 164.1  GEN: WDWN, NAD, Non-toxic, A & O x 3 HEENT: Atraumatic, Normocephalic. Neck supple. No masses, No LAD. Ears and Nose: No external deformity. CV: RRR, No M/G/R. No JVD. No thrill. No extra heart sounds. PULM: CTA B, no wheezes, crackles, rhonchi. No retractions. No resp. distress. No accessory muscle use. EXTR: No c/c/e NEURO Normal gait.  PSYCH: Normally interactive. Conversant. Not depressed or anxious appearing.  Calm demeanor.    Assessment and Plan: Uncontrolled type 2 diabetes mellitus without complication, with long-term current use of insulin ([place]) - Plan: Insulin Pen Needle (PEN NEEDLES) 31G X 5 MM MISC, POCT glucose (manual entry)  Right upper quadrant abdominal mass   Went over how to use pen, and pt gave herself her first injection of 10 units of basaglar in the office today.  Discussed titration. She will update me when she reaches 20 units. See pt instructions for more details.    She also did have a couple of tiny, subcutaneous feeling masses in her RUQ that we addressed at our last visit.  I sent her for an [place] as below- they were not able to see these spots on imaging.  Suspect these are benign findings in the soft tissue.  Discussed a CT- she would like to observe for now which is reasonalbe Signed Lamar Blinks, MD  US Abdomen Limited Ruq  Result Date: 02/22/2017 CLINICAL DATA:  Superficial RIGHT upper quadrant mass. Mild upper abdominal pain. EXAM: ULTRASOUND ABDOMEN LIMITED RIGHT UPPER QUADRANT COMPARISON:  None. FINDINGS: Gallbladder: No gallstones or wall thickening visualized. No sonographic Murphy sign noted by sonographer. Common bile duct: Diameter: Normal 3 mm Liver: No focal lesion identified. Within normal limits in parenchymal echogenicity. Portal vein is  patent on color Doppler imaging with normal direction of blood flow towards the liver. Evaluation of the subcutaneous tissue in the RIGHT upper quadrant palpable region demonstrates no discrete measurable lesion. IMPRESSION: 1. No discrete measurable lesion in the RIGHT upper quadrant at site of palpable abnormality. 2. Normal liver and gallbladder. Electronically Signed   By: Suzy Bouchard M.D.   On: 02/22/2017 12:01

## 2017-02-25 ENCOUNTER — Ambulatory Visit (INDEPENDENT_AMBULATORY_CARE_PROVIDER_SITE_OTHER): Payer: 59 | Admitting: Family Medicine

## 2017-02-25 ENCOUNTER — Encounter: Payer: Self-pay | Admitting: Family Medicine

## 2017-02-25 VITALS — BP 122/80 | HR 97 | Temp 98.4°F | Resp 16 | Ht 68.0 in | Wt 303.8 lb

## 2017-02-25 DIAGNOSIS — IMO0001 Reserved for inherently not codable concepts without codable children: Secondary | ICD-10-CM

## 2017-02-25 DIAGNOSIS — R1901 Right upper quadrant abdominal swelling, mass and lump: Secondary | ICD-10-CM | POA: Diagnosis not present

## 2017-02-25 DIAGNOSIS — E1165 Type 2 diabetes mellitus with hyperglycemia: Secondary | ICD-10-CM | POA: Diagnosis not present

## 2017-02-25 DIAGNOSIS — Z794 Long term (current) use of insulin: Secondary | ICD-10-CM | POA: Diagnosis not present

## 2017-02-25 LAB — GLUCOSE, POCT (MANUAL RESULT ENTRY): POC Glucose: 289 mg/dl — AB (ref 70–99)

## 2017-02-25 MED ORDER — PEN NEEDLES 31G X 5 MM MISC: 1.0000 "pen " | Freq: Every day | 99 refills | Status: DC

## 2017-02-25 NOTE — Patient Instructions (Addendum)
Please take your fasting glucose every morning. Increase your basaglar by 2 units every 2 days, until your fasting sugar is about 150  Please send me a message when you get to about 20 units We may want to add an inexpensive oral medication like glipizide as a helper medication once your sugars start to come down some   Please let me know if any questions!     Please see me in 3 months for an A1c check   US Abdomen Limited Ruq  Result Date: 02/22/2017 CLINICAL DATA:  Superficial RIGHT upper quadrant mass. Mild upper abdominal pain. EXAM: ULTRASOUND ABDOMEN LIMITED RIGHT UPPER QUADRANT COMPARISON:  None. FINDINGS: Gallbladder: No gallstones or wall thickening visualized. No sonographic Murphy sign noted by sonographer. Common bile duct: Diameter: Normal 3 mm Liver: No focal lesion identified. Within normal limits in parenchymal echogenicity. Portal vein is patent on color Doppler imaging with normal direction of blood flow towards the liver. Evaluation of the subcutaneous tissue in the RIGHT upper quadrant palpable region demonstrates no discrete measurable lesion. IMPRESSION: 1. No discrete measurable lesion in the RIGHT upper quadrant at site of palpable abnormality. 2. Normal liver and gallbladder. Electronically Signed   By: Suzy Bouchard M.D.   On: 02/22/2017 12:01

## 2017-02-28 ENCOUNTER — Encounter: Payer: Self-pay | Admitting: Family Medicine

## 2017-03-02 MED ORDER — GLUCOSE BLOOD VI STRP: ORAL_STRIP | 1 refills | Status: DC

## 2017-03-09 ENCOUNTER — Ambulatory Visit (HOSPITAL_BASED_OUTPATIENT_CLINIC_OR_DEPARTMENT_OTHER)
Admission: RE | Admit: 2017-03-09 | Discharge: 2017-03-09 | Disposition: A | Discharge status: Home / Self Care | Payer: 59 | Source: Ambulatory Visit | Attending: Family Medicine | Admitting: Family Medicine

## 2017-03-09 ENCOUNTER — Ambulatory Visit (INDEPENDENT_AMBULATORY_CARE_PROVIDER_SITE_OTHER): Payer: 59 | Admitting: Family Medicine

## 2017-03-09 ENCOUNTER — Encounter (HOSPITAL_BASED_OUTPATIENT_CLINIC_OR_DEPARTMENT_OTHER): Payer: Self-pay

## 2017-03-09 ENCOUNTER — Encounter: Payer: Self-pay | Admitting: Family Medicine

## 2017-03-09 VITALS — BP 135/98 | HR 101 | Resp 16 | Ht 68.0 in | Wt 308.0 lb

## 2017-03-09 DIAGNOSIS — B373 Candidiasis of vulva and vagina: Secondary | ICD-10-CM

## 2017-03-09 DIAGNOSIS — N950 Postmenopausal bleeding: Secondary | ICD-10-CM | POA: Insufficient documentation

## 2017-03-09 DIAGNOSIS — R9389 Abnormal findings on diagnostic imaging of other specified body structures: Secondary | ICD-10-CM | POA: Insufficient documentation

## 2017-03-09 DIAGNOSIS — E119 Type 2 diabetes mellitus without complications: Secondary | ICD-10-CM

## 2017-03-09 DIAGNOSIS — B3731 Acute candidiasis of vulva and vagina: Secondary | ICD-10-CM

## 2017-03-09 MED ORDER — FLUCONAZOLE 150 MG PO TABS: 150.0000 mg | ORAL_TABLET | ORAL | 0 refills | Status: AC

## 2017-03-09 NOTE — Patient Instructions (Signed)

## 2017-03-09 NOTE — Progress Notes (Signed)
Subjective:    Patient ID: Nicole Hubbard, female    DOB: Mar 14, 1965, 52 y.o.   MRN: [id number]  HPI Patient seen for abnormal uterine bleeding.  Over the past several months, the patient has had vaginal spotting 2-3 times a week.  Mainly notices a spotting when she wipes.  No palliating or provoking factors.  Did have endometrial ablation approximately 7 years ago.  Is having some hot flashes etc.  Also has increased vaginal itching clumpy white discharge.  She was seen by PCP a couple weeks ago, received 2 doses of Diflucan, which improved her symptoms but did not resolve.  She does have diabetes with hemoglobin A1c of 14.  This was last checked on 02/18/17.  This is not a new diagnosis for her.   Review of Systems  All other systems reviewed and are negative.  I have reviewed the patients past medical, family, and social history.  I have reviewed the patient's medication list and allergies.      Objective:   Physical Exam  Constitutional: She is oriented to person, place, and time.  HENT:  Head: Normocephalic and atraumatic.  Right Ear: External ear normal.  Left Ear: External ear normal.  Eyes: Pupils are equal, round, and reactive to light.  Neck: Normal range of motion. Neck supple.  Cardiovascular: Normal rate, regular rhythm and normal heart sounds.  Pulmonary/Chest: Effort normal and breath sounds normal.  Abdominal: Soft. Bowel sounds are normal. She exhibits no distension and no mass. There is no tenderness. There is no rebound and no guarding. Hernia confirmed negative in the right inguinal area and confirmed negative in the left inguinal area.  Genitourinary: There is rash (mild vulvovaginitis) on the right labia. There is no tenderness, lesion or injury on the right labia. There is rash (mild vulvovaginitis) on the left labia. Right adnexum displays no mass, no tenderness and no fullness. Left adnexum displays no mass, no tenderness and no fullness. No erythema, tenderness or  bleeding in the vagina. No foreign body in the vagina. No signs of injury around the vagina. No vaginal discharge found.  Lymphadenopathy:       Right: No inguinal adenopathy present.       Left: No inguinal adenopathy present.  Neurological: She is alert and oriented to person, place, and time.  Skin: Skin is warm and dry.  Psychiatric: She has a normal mood and affect. Her behavior is normal. Judgment and thought content normal.    ENDOMETRIAL BIOPSY     The indications for endometrial biopsy were reviewed.   Risks of the biopsy including cramping, bleeding, infection, uterine perforation, inadequate specimen and need for additional procedures  were discussed. The patient states she understands and agrees to undergo procedure today. Consent was signed. Time out was performed. Urine HCG was negative. A sterile speculum was placed in the patient's vagina and the cervix was prepped with Betadine. A single-toothed tenaculum was placed on the anterior lip of the cervix to stabilize it. The 3 mm pipelle was introduced into the endometrial cavity without difficulty to a depth of 6-7cm, and a moderate amount of tissue was obtained and sent to pathology. The instruments were removed from the patient's vagina. Minimal bleeding from the cervix was noted. The patient tolerated the procedure well. Routine post-procedure instructions were given to the patient. The patient will follow up to review the results and for further management.       Assessment & Plan:  1. Postmenopausal bleeding Endometrial biopsy  done today.  Will check ultrasound.  With endometrial ablation, may need further evaluation. - US PELVIS (TRANSABDOMINAL ONLY); Future - US PELVIS TRANSVANGINAL NON-OB (TV ONLY); Future - Surgical pathology  2. Vulvovaginitis due to yeast Secondary to uncontrolled DM2.  Discussed improving glycemic control will help with her symptoms.  Will prescribe Diflucan every 3 days for 2 weeks.  3. Type 2  diabetes mellitus without complication, without long-term current use of insulin ([place])   4. Morbid obesity ([place])

## 2017-03-11 ENCOUNTER — Ambulatory Visit: Payer: Self-pay | Admitting: Family

## 2017-03-11 ENCOUNTER — Ambulatory Visit (INDEPENDENT_AMBULATORY_CARE_PROVIDER_SITE_OTHER): Payer: 59 | Admitting: Medical

## 2017-03-11 ENCOUNTER — Telehealth: Payer: Self-pay | Admitting: Medical

## 2017-03-11 ENCOUNTER — Encounter: Payer: Self-pay | Admitting: Medical

## 2017-03-11 ENCOUNTER — Ambulatory Visit: Payer: Self-pay | Admitting: *Deleted

## 2017-03-11 VITALS — BP 140/95 | HR 90 | Temp 97.6°F | Resp 16 | Ht 68.0 in | Wt 306.6 lb

## 2017-03-11 DIAGNOSIS — R51 Headache: Secondary | ICD-10-CM | POA: Diagnosis not present

## 2017-03-11 DIAGNOSIS — I10 Essential (primary) hypertension: Secondary | ICD-10-CM | POA: Diagnosis not present

## 2017-03-11 DIAGNOSIS — H538 Other visual disturbances: Secondary | ICD-10-CM

## 2017-03-11 DIAGNOSIS — R110 Nausea: Secondary | ICD-10-CM

## 2017-03-11 DIAGNOSIS — R519 Headache, unspecified: Secondary | ICD-10-CM

## 2017-03-11 LAB — POC URINALSYSI DIPSTICK (AUTOMATED)
Bilirubin, UA: NEGATIVE
GLUCOSE UA: NEGATIVE
KETONES UA: NEGATIVE
Leukocytes, UA: NEGATIVE
Nitrite, UA: NEGATIVE
Protein, UA: NEGATIVE
SPEC GRAV UA: 1.02 (ref 1.010–1.025)
UROBILINOGEN UA: 0.2 U/dL
pH, UA: 6 (ref 5.0–8.0)

## 2017-03-11 MED FILL — BUTALBITAL-ACETAMINOPHN 50-: 50-325 | 4 days supply | Qty: 16 | Fill #0

## 2017-03-11 NOTE — Progress Notes (Signed)
Subjective:    Patient ID: Nicole Hubbard, female    DOB: 03-17-1965, 52 y.o.   MRN: [id number]  HPI  Pt in states her vision has been some blurry recently. Pt has some ha and known high blood pressure. Pt not sure why blurred vision. She thinks maybe blood sugars or high bp. Recent sugars have been in range 250-330 appproximate since starting basaglar.    She initially states blurred vision even with her glasses. She states while waiting for me she tried reading snellen chart and best vision with both eyes was 20/70. Gradual worse vision over past one year despite getting new glasses/prescription.  Pt has been diabetic for years. Just recently started basaglar 2 weeks ago.  Pt a1-c 3 weeks ago was  14.8.  [facility] sugar was 294 about 30 minutes ago.   Pt highest states this morning sugar was 311. Pt states in past had been on metformin. But that was dc'd when switched to basaglar.   Pt also was on trulicity in past but her insurance would not cover.  Pt states when she got to 20 units Dr. Lorelei Pont had planned to add other med but I could not find that in Epic?  Pt does not have blood pressure cough at home.    Review of Systems  Constitutional: Negative for chills, diaphoresis, fatigue and fever.  HENT: Negative for dental problem and drooling.   Eyes: Positive for visual disturbance. Negative for pain and itching.  Respiratory: Negative for cough, chest tightness, wheezing and stridor.   Cardiovascular: Negative for chest pain and palpitations.  Gastrointestinal: Positive for nausea. Negative for abdominal distention, abdominal pain, blood in stool, diarrhea and vomiting.  Genitourinary: Negative for dysuria, enuresis and flank pain.  Musculoskeletal: Negative for back pain.  Skin: Negative for pallor and wound.  Neurological: Positive for headaches. Negative for dizziness, syncope, facial asymmetry, speech difficulty, weakness, light-headedness and numbness.         7-8 level head last 3 days. Not as bad as former migraine ha.  Hematological: Negative for adenopathy. Does not bruise/bleed easily.  Psychiatric/Behavioral: Negative for behavioral problems, decreased concentration, hallucinations and sleep disturbance. The patient is not nervous/anxious.    Past Medical History:  Diagnosis Date  . Abnormal Pap smear of cervix   . Anxiety   . Chest pain   . Complication of anesthesia    Pt stated "I gagged out after having ablation at [facility]"  . Depression   . Diabetes mellitus without complication ([place])   . Hypertension   . Migraine   . Sleep apnea    does not wear CPAP     Social History   Socioeconomic History  . Marital status: Divorced    Spouse name: Not on file  . Number of children: 1  . Years of education: College  . Highest education level: Not on file  Social Needs  . Financial resource strain: Not on file  . Food insecurity - worry: Not on file  . Food insecurity - inability: Not on file  . Transportation needs - medical: Not on file  . Transportation needs - non-medical: Not on file  Occupational History  . Occupation: [employer]   Tobacco Use  . Smoking status: Former Smoker    Types: Cigarettes    Last attempt to quit: 09/15/2008    Years since quitting: 8.4  . Smokeless tobacco: Never Used  Substance and Sexual Activity  . Alcohol  use: Yes    Alcohol/week: 1.2 oz    Types: 2 Glasses of wine per week    Comment: occasional  . Drug use: No  . Sexual activity: Yes    Partners: Male    Birth control/protection: None  Other Topics Concern  . Not on file  Social History Narrative   Lives with mother   Caffeine use: soda sometimes, drinks hot tea sometimes   Right- handed    Past Surgical History:  Procedure Laterality Date  . BACK SURGERY  2017  . KNEE SURGERY Right    Torn meniscus  . TONSILLECTOMY    . uterine ablation      Family History  Problem Relation Age of Onset  . Cancer Father         urethea  . Diabetes Mother   . Hypertension Mother     Allergies  Allergen Reactions  . Metformin And Related     Pt stopped due to persistent vomiting    Current Outpatient Medications on File Prior to Visit  Medication Sig Dispense Refill  . albuterol (PROVENTIL HFA;VENTOLIN HFA) 108 (90 Base) MCG/ACT inhaler Inhale 1-2 puffs into the lungs every 6 (six) hours as needed for wheezing or shortness of breath. 18 g 5  . amLODipine (NORVASC) 10 MG tablet TAKE 1 TABLET (10 MG TOTAL) BY MOUTH DAILY. 90 tablet 3  . blood glucose meter kit and supplies KIT Dispense based on patient and insurance preference. Use up to four times daily as directed. (FOR ICD-9 250.00, 250.01). Strips with 11 refills  Lancets with 11 refills 1 each 0  . citalopram (CELEXA) 40 MG tablet TAKE 1 TABLET BY MOUTH DAILY FOR DEPRESSION 90 tablet 3  . cyclobenzaprine (FLEXERIL) 10 MG tablet Take 1 tablet (10 mg total) by mouth 2 (two) times daily as needed for muscle spasms. 30 tablet 0  . fluconazole (DIFLUCAN) 150 MG tablet Take 1 tablet (150 mg total) by mouth every 3 (three) days for 6 doses. 6 tablet 0  . glucose blood (ACCU-CHEK AVIVA) test strip Use as instructed to check blood sugar up to four times daily.  DX  E11.9 400 each 1  . hydrochlorothiazide (HYDRODIURIL) 25 MG tablet TAKE 1 TABLET (25 MG TOTAL) BY MOUTH DAILY. FOR HIGH BLOOD PRESSURE 90 tablet 2  . Insulin Glargine (BASAGLAR KWIKPEN) 100 UNIT/ML SOPN Start with 10 units daily and titrate as directed 15 mL 6  . Insulin Pen Needle (PEN NEEDLES) 31G X 5 MM MISC 1 pen by Does not apply route daily. 100 each prn  . losartan (COZAAR) 100 MG tablet TAKE 1 TABLET (100 MG TOTAL) BY MOUTH DAILY. FOR HIGH BLOOD PRESSURE 90 tablet 3  . pravastatin (PRAVACHOL) 40 MG tablet TAKE 1 TABLET (40 MG TOTAL) BY MOUTH DAILY. 90 tablet 3   No current facility-administered medications on file prior to visit.     BP (!) 140/95   Pulse 90   Temp 97.6 F (36.4 C) (Oral)    Resp 16   Ht _0  (1.727 m)   Wt (!) 306 lb 9.6 oz (139.1 kg)   LMP 02/03/2014   SpO2 96%   BMI 46.62 kg/m       Objective:   Physical Exam   General Mental Status- Alert. General Appearance- Not in acute distress.   Skin General: Color- Normal Color. Moisture- Normal Moisture.  Neck Carotid Arteries- Normal color. Moisture- Normal Moisture. No carotid bruits. No JVD.  Chest and Lung Exam Auscultation:  Breath Sounds:-Normal.  Cardiovascular Auscultation:Rythm- Regular. Murmurs & Other Heart Sounds:Auscultation of the heart reveals- No Murmurs.  Abdomen Inspection:-Inspeection Normal. Palpation/Percussion:Note:No mass. Palpation and Percussion of the abdomen reveal- Non Tender, Non Distended + BS, no rebound or guarding.    Neurologic Cranial Nerve exam:- CN III-XII intact(No nystagmus), symmetric smile. Drift Test:- No drift. Romberg Exam:- Negative.  Heal to Toe Gait exam:-Normal. Finger to Nose:- Normal/Intact Strength:- 5/5 equal and symmetric strength both upper and lower extremities.     Assessment & Plan:  For recent severe ha and history of migraine will rx fiorinal with acetomenophin. Will see if this stops your HA. I wanted to get CT today but you declined since daughter has appointment at 5 pm. If your ha worsens or changes then ED evaluation. I am trying to get ct head scheduled for tomorrow in the morning.   For htn, continue current meds. rx bp cuff given. Please get and check bp daily. If bp higher than 140/90 need to make changes to regimen.  For diabetes increase basglar by 2 units tomorrow am. I will try to get answer by Dr. Lorelei Pont on what med she had planned to add on.a  Referred to optomettrist for blurred vision.   Follow up in 2-5 days or as needed  Over 40 minutes spent with pt. 50% of time spent discussing features of ha(differential dx,  htn management(checking bp at home), recent diabetic treatment changes. Counseling on plan going  forward and then arranged referral to optometrist and CT scan.  Mackie Pai, PA-C

## 2017-03-11 NOTE — Telephone Encounter (Signed)
Dr. Lorelei Pont,  I saw Nicole Hubbard today. Her sugars have been between 250-320 recently. Not consistently trending downward. Today sugar was 311 in am. She is currently on basaglar 20 units. I advised increased to 22 units tomorrow am. She had mentioned that when she got to 20 units you were going to add other med. I tried to find that in epic and could not. Sorry to have to send message to you while you are off.   Thanks, Mackie Pai, PA-C

## 2017-03-11 NOTE — Telephone Encounter (Signed)
Pt called because on Monday her blood pressure was 185/113 but her blood pressure always runs high; she also says that her vision has been blurred since she started a new insulin pen on 02/27/17 (basaglar [place]) and she is now at 22 units; she states that her fasting blood sugar was 284 this morning at 0800, and 364 yesterday; pt also states that she has frequent urination, thirst, and a headache;  recommendations made per nurse triage protocol; pt is normally seen by Dr Janett Billow Copland at [facility] but no availability noted for that office;spoke with Gareth Eagle at [facility] and  pt offered and accepted appointment with Jodi Mourning at [facility] at 1540; pt verbalizes understanding and says that her mom will take her to her appointment; will route to [facility] for notification of this encounter.  Reason for Disposition . [1] Blood glucose > 300 mg/dl (16.5 mmol/l) AND [2] two or more times in a row  Answer Assessment - Initial Assessment Questions 1. BLOOD GLUCOSE: "What is your blood glucose level?"      284 2. ONSET: "When did you check the blood glucose?"     0800 03/11/17 3. USUAL RANGE: "What is your glucose level usually?" (e.g., usual fasting morning value, usual evening value)     Upper 200's to 364 fasting 4. KETONES: "Do you check for ketones (urine or blood test strips)?" If yes, ask: "What does the test show now?"      no 5. TYPE 1 or 2:  "Do you know what type of diabetes you have?"  (e.g., Type 1, Type 2, Gestational; doesn't know)      Type 2 6. INSULIN: "Do you take insulin?" If yes, ask: "Have you missed any shots recently?"     yes 7. DIABETES PILLS: "Do you take any pills for your diabetes?" If yes, ask: "Have you missed taking any pills recently?"     no 8. OTHER SYMPTOMS: "Do you have any symptoms?" (e.g., fever, frequent urination, difficulty breathing, dizziness, weakness, vomiting)     Blurred vision, thirsty, frequent urination, headache 9. PREGNANCY: "Is there any chance you  are pregnant?" "When was your last menstrual period?" No LMP seeing GYN for spotting  Protocols used: DIABETES - HIGH BLOOD SUGAR-A-AH

## 2017-03-11 NOTE — Patient Instructions (Addendum)
For recent severe ha and history of migraine will rx fiorinal with acetomenophin. Will see if this stops your HA. I wanted to get CT today but you declined since daughter has appointment at 5 pm. If your ha worsens or changes then ED evaluation. I am trying to get ct head scheduled for tomorrow in the morning. Eventually was scheduled at 9 am. Gwenn notified pt.  For htn, continue current meds. rx bp cuff given. Please get and check bp daily. If bp higher than 140/90 need to make changes to regimen.  For diabetes increase basglar by 2 units tomorrow am(tomorrow am would be 22 units). I will try to get answer by Dr. Lorelei Pont on what med she had planned to add on to your current regimen.  Referred to optometrist for blurred vision.   Follow up in 2-5 days or as needed  Please update [place] by tomorrow morning on level of ha. Repeat urine on follow up since some blood in urine today.

## 2017-03-11 NOTE — Telephone Encounter (Signed)
Spoke with Shaquita at [facility] regarding pt appointment; able to offer pt appointment with Mackie Pai today at 1400; contacted pt and she accepts this pt and verbalizes understanding; also notified Carson at [facility] of change of appointment time and location

## 2017-03-12 ENCOUNTER — Ambulatory Visit (HOSPITAL_BASED_OUTPATIENT_CLINIC_OR_DEPARTMENT_OTHER)
Admission: RE | Admit: 2017-03-12 | Discharge: 2017-03-12 | Disposition: A | Discharge status: Home / Self Care | Payer: 59 | Source: Ambulatory Visit | Attending: Medical | Admitting: Medical

## 2017-03-12 DIAGNOSIS — I10 Essential (primary) hypertension: Secondary | ICD-10-CM

## 2017-03-12 DIAGNOSIS — R51 Headache: Secondary | ICD-10-CM | POA: Insufficient documentation

## 2017-03-12 DIAGNOSIS — G319 Degenerative disease of nervous system, unspecified: Secondary | ICD-10-CM | POA: Diagnosis not present

## 2017-03-12 DIAGNOSIS — R519 Headache, unspecified: Secondary | ICD-10-CM

## 2017-03-12 DIAGNOSIS — R110 Nausea: Secondary | ICD-10-CM | POA: Diagnosis present

## 2017-03-12 LAB — COMPREHENSIVE METABOLIC PANEL
ALBUMIN: 3.9 g/dL (ref 3.5–5.2)
ALK PHOS: 68 U/L (ref 39–117)
ALT: 9 U/L (ref 0–35)
AST: 12 U/L (ref 0–37)
BILIRUBIN TOTAL: 0.4 mg/dL (ref 0.2–1.2)
BUN: 6 mg/dL (ref 6–23)
CALCIUM: 9.9 mg/dL (ref 8.4–10.5)
CO2: 31 mEq/L (ref 19–32)
Chloride: 97 mEq/L (ref 96–112)
Creatinine, Ser: 0.71 mg/dL (ref 0.40–1.20)
GFR: 110.89 mL/min (ref >60.00)
Glucose, Bld: 207 mg/dL — ABNORMAL HIGH (ref 70–99)
Potassium: 3.5 mEq/L (ref 3.5–5.1)
Sodium: 136 mEq/L (ref 135–145)
TOTAL PROTEIN: 8.5 g/dL — AB (ref 6.0–8.3)

## 2017-03-12 LAB — CBC WITH DIFFERENTIAL/PLATELET
BASOS ABS: 0.1 10*3/uL (ref 0.0–0.1)
Basophils Relative: 1.2 % (ref 0.0–3.0)
Eosinophils Absolute: 0.1 10*3/uL (ref 0.0–0.7)
Eosinophils Relative: 1.6 % (ref 0.0–5.0)
HEMATOCRIT: 43.3 % (ref 36.0–46.0)
Hemoglobin: 14 g/dL (ref 12.0–15.0)
LYMPHS ABS: 3.1 10*3/uL (ref 0.7–4.0)
LYMPHS PCT: 45.3 % (ref 12.0–46.0)
MCHC: 32.4 g/dL (ref 30.0–36.0)
MCV: 86.2 fl (ref 78.0–100.0)
MONOS PCT: 8.6 % (ref 3.0–12.0)
Monocytes Absolute: 0.6 10*3/uL (ref 0.1–1.0)
NEUTROS PCT: 43.3 % (ref 43.0–77.0)
Neutro Abs: 3 10*3/uL (ref 1.4–7.7)
Platelets: 248 10*3/uL (ref 150.0–400.0)
RBC: 5.02 Mil/uL (ref 3.87–5.11)
RDW: 14.7 % (ref 11.5–15.5)
WBC: 6.9 10*3/uL (ref 4.0–10.5)

## 2017-03-13 ENCOUNTER — Encounter: Payer: Self-pay | Admitting: Family Medicine

## 2017-03-13 ENCOUNTER — Encounter: Payer: Self-pay | Admitting: Medical

## 2017-03-13 ENCOUNTER — Telehealth: Payer: Self-pay | Admitting: Medical

## 2017-03-13 MED ORDER — METOPROLOL SUCCINATE ER 25 MG PO TB24: 25.0000 mg | ORAL_TABLET | Freq: Every day | ORAL | 0 refills | Status: DC

## 2017-03-13 NOTE — Telephone Encounter (Signed)
Prescription of metoprolol sent to patient's pharmacy.  Wanted to patient's my chart message.

## 2017-03-13 NOTE — Telephone Encounter (Signed)
Duplicate messages- sent to Percell Miller since PCP is out of office.

## 2017-03-15 ENCOUNTER — Encounter: Payer: Self-pay | Admitting: Family Medicine

## 2017-03-15 NOTE — Telephone Encounter (Signed)
Hi Edward- no problem, thanks for much for taking care of her in my absence.  Her A1c last month was 14.5, I think she may not understand just how high her glucose has been here recently!   I have contacted her about the next step  Thanks again for taking such good care of her JC

## 2017-03-15 NOTE — Progress Notes (Signed)
[place] at [employer] [place], [place], [place] [address] [phone]  Date:  03/16/2017   Name:  Nicole Hubbard   DOB:  28-Aug-1965   MRN:  [id number]  PCP:  Darreld Mclean, MD    Chief Complaint: Follow-up   History of Present Illness:  Nicole Hubbard is a 52 y.o. very pleasant female patient who presents with the following:  Rechecking on her DM today- she was in and saw Percell Miller while I was away last week, appreciate his help with this pt  Lab Results  Component Value Date   HGBA1C 14.8 (H) 02/18/2017   I saw her in April of 18, and then not again until 2/18 In that interim her A1c went from 10.9 to 14.8- this is why we started insulin  I last saw her 2/13 to start on once a day insulin glargine as her A1c was very high She was in again last week with concern of her glucose running high still She is on 22 units of glargine now, was not sure if she should continue to go up  She is also here today as her vision is causing difficulty for her at work- she spends all day on a computer.  Straining to see the computer seems to be causing headaches She also cannot really watch TV As long as she does not use her computer her HA are much better Her glasses help but still do not give her clear vision She has noted blurry vision for over a year now She last saw her opomorist about one year ago, and all seemed to be ok  She has been OOW since this past Wednesday 2/27 due to her vision concerns  She has been to optometry a few times, but not to ophthalmology ever.    [facility] has her on their schedule but not until May- we will try to move this up  She has been concerned as her glucose readings are still in the 200s and sometimes in the 300s.  Explained that her recent A1c of 14.8 is consistent with average sugar of 380, so her glucose has likely been very high for some time, she was just not aware.  She is  making progress   She is not able to tolerate metformin  For BP she is on amlodipine 10 Losartan 100 hctz 25 toprol xo 25 which Edward added just recently  However her home BP are still running quite high- she brings in a list of readings above goal.  As her pulse is still near 100 can increase her BB dose     BP Readings from Last 3 Encounters:  03/16/17 (!) 144/86  03/11/17 (!) 140/95  03/09/17 (!) 135/98   Pulse Readings from Last 3 Encounters:  03/16/17 98  03/11/17 90  03/09/17 (!) 101   Wt Readings from Last 3 Encounters:  03/16/17 (!) 303 lb 3.2 oz (137.5 kg)  03/11/17 (!) 306 lb 9.6 oz (139.1 kg)  03/09/17 (!) 308 lb (139.7 kg)   She is trying to lose weight !  Down 5 lbs so far- praised her efforts  Patient Active Problem List   Diagnosis Date Noted  . Sleep disorder 05/05/2016  . Chronic insomnia 05/05/2016  . DDD (degenerative disc disease), lumbar 03/19/2015  . MDD (major depressive disorder), recurrent severe, without psychosis ([place]) 08/17/2013  . Morbid obesity ([place]) 06/30/2012  . Depression 01/26/2012  .  Diabetes mellitus, type 2 ([place]) 01/26/2012  . HTN (hypertension) 01/26/2012    Past Medical History:  Diagnosis Date  . Abnormal Pap smear of cervix   . Anxiety   . Chest pain   . Complication of anesthesia    Pt stated "I gagged out after having ablation at [facility]"  . Depression   . Diabetes mellitus without complication ([place])   . Hypertension   . Migraine   . Sleep apnea    does not wear CPAP    Past Surgical History:  Procedure Laterality Date  . BACK SURGERY  2017  . KNEE SURGERY Right    Torn meniscus  . TONSILLECTOMY    . uterine ablation      Social History   Tobacco Use  . Smoking status: Former Smoker    Types: Cigarettes    Last attempt to quit: 09/15/2008    Years since quitting: 8.5  . Smokeless tobacco: Never Used  Substance Use Topics  . Alcohol use: Yes    Alcohol/week: 1.2 oz    Types: 2 Glasses of wine  per week    Comment: occasional  . Drug use: No    Family History  Problem Relation Age of Onset  . Cancer Father        urethea  . Diabetes Mother   . Hypertension Mother     Allergies  Allergen Reactions  . Metformin And Related     Pt stopped due to persistent vomiting    Medication list has been reviewed and updated.  Current Outpatient Medications on File Prior to Visit  Medication Sig Dispense Refill  . albuterol (PROVENTIL HFA;VENTOLIN HFA) 108 (90 Base) MCG/ACT inhaler Inhale 1-2 puffs into the lungs every 6 (six) hours as needed for wheezing or shortness of breath. 18 g 5  . amLODipine (NORVASC) 10 MG tablet TAKE 1 TABLET (10 MG TOTAL) BY MOUTH DAILY. 90 tablet 3  . blood glucose meter kit and supplies KIT Dispense based on patient and insurance preference. Use up to four times daily as directed. (FOR ICD-9 250.00, 250.01). Strips with 11 refills  Lancets with 11 refills 1 each 0  . citalopram (CELEXA) 40 MG tablet TAKE 1 TABLET BY MOUTH DAILY FOR DEPRESSION 90 tablet 3  . cyclobenzaprine (FLEXERIL) 10 MG tablet Take 1 tablet (10 mg total) by mouth 2 (two) times daily as needed for muscle spasms. 30 tablet 0  . fluconazole (DIFLUCAN) 150 MG tablet Take 1 tablet (150 mg total) by mouth every 3 (three) days for 6 doses. 6 tablet 0  . glucose blood (ACCU-CHEK AVIVA) test strip Use as instructed to check blood sugar up to four times daily.  DX  E11.9 400 each 1  . hydrochlorothiazide (HYDRODIURIL) 25 MG tablet TAKE 1 TABLET (25 MG TOTAL) BY MOUTH DAILY. FOR HIGH BLOOD PRESSURE 90 tablet 2  . Insulin Glargine (BASAGLAR KWIKPEN) 100 UNIT/ML SOPN Start with 10 units daily and titrate as directed 15 mL 6  . Insulin Pen Needle (PEN NEEDLES) 31G X 5 MM MISC 1 pen by Does not apply route daily. 100 each prn  . losartan (COZAAR) 100 MG tablet TAKE 1 TABLET (100 MG TOTAL) BY MOUTH DAILY. FOR HIGH BLOOD PRESSURE 90 tablet 3  . metoprolol succinate (TOPROL-XL) 25 MG 24 hr tablet Take 1  tablet (25 mg total) by mouth daily. 30 tablet 0  . pravastatin (PRAVACHOL) 40 MG tablet TAKE 1 TABLET (40 MG TOTAL) BY MOUTH DAILY. 90 tablet 3  No current facility-administered medications on file prior to visit.     Review of Systems:  As per HPI- otherwise negative. No fever or chills No unintended weight loss No sudden vision change- this is all long standing   Physical Examination: Vitals:   03/16/17 1428  BP: (!) 144/86  Pulse: 98  Resp: 16  Temp: 98.4 F (36.9 C)  SpO2: 96%   Vitals:   03/16/17 1428  Weight: (!) 303 lb 3.2 oz (137.5 kg)   Body mass index is 46.1 kg/m. Ideal Body Weight:    GEN: WDWN, NAD, Non-toxic, A & O x 3, obese, looks well  HEENT: Atraumatic, Normocephalic. Neck supple. No masses, No LAD. Ears and Nose: No external deformity. CV: RRR, No M/G/R. No JVD. No thrill. No extra heart sounds. PULM: CTA B, no wheezes, crackles, rhonchi. No retractions. No resp. distress. No accessory muscle use. ABD: S, NT, ND, +BS. No rebound. No HSM. EXTR: No c/c/e NEURO Normal gait.  PSYCH: Normally interactive. Conversant. Not depressed or anxious appearing.  Calm demeanor.    Assessment and Plan: Uncontrolled type 2 diabetes mellitus without complication, with long-term current use of insulin (HCC) - Plan: glimepiride (AMARYL) 2 MG tablet  Essential hypertension - Plan: metoprolol succinate (TOPROL-XL) 50 MG 24 hr tablet  Blurred vision  Post-menopausal bleeding  Morbid obesity, unspecified obesity type ([place])  She is undergoing work up of PMB per DR. Stinson currently Will add amaryl 2 mg to her diabetes regimen.  She will hold at her current dose for a few days to allow for this addition, and then will continue to increase her glargine by 2 units every 2 days until her FBG is about 150 Increase her metoprolol to 50 mg   Signed Lamar Blinks, MD

## 2017-03-16 ENCOUNTER — Encounter: Payer: Self-pay | Admitting: Family Medicine

## 2017-03-16 ENCOUNTER — Ambulatory Visit (INDEPENDENT_AMBULATORY_CARE_PROVIDER_SITE_OTHER): Payer: 59 | Admitting: Family Medicine

## 2017-03-16 VITALS — BP 144/86 | HR 98 | Temp 98.4°F | Resp 16 | Wt 303.2 lb

## 2017-03-16 DIAGNOSIS — I10 Essential (primary) hypertension: Secondary | ICD-10-CM

## 2017-03-16 DIAGNOSIS — IMO0001 Reserved for inherently not codable concepts without codable children: Secondary | ICD-10-CM

## 2017-03-16 DIAGNOSIS — Z794 Long term (current) use of insulin: Secondary | ICD-10-CM

## 2017-03-16 DIAGNOSIS — E1165 Type 2 diabetes mellitus with hyperglycemia: Secondary | ICD-10-CM | POA: Diagnosis not present

## 2017-03-16 DIAGNOSIS — N950 Postmenopausal bleeding: Secondary | ICD-10-CM

## 2017-03-16 DIAGNOSIS — H538 Other visual disturbances: Secondary | ICD-10-CM

## 2017-03-16 MED ORDER — GLIMEPIRIDE 2 MG PO TABS: 2.0000 mg | ORAL_TABLET | Freq: Every day | ORAL | 3 refills | Status: DC

## 2017-03-16 MED ORDER — METOPROLOL SUCCINATE ER 50 MG PO TB24: 50.0000 mg | ORAL_TABLET | Freq: Every day | ORAL | 11 refills | Status: DC

## 2017-03-16 NOTE — Patient Instructions (Addendum)
We are going to add amaryl 2 mg to your diabetes regimen.  Stick with your current insulin dose for a few days, and then you can continue to increase by 2 units every 2 days  We will increase your metoprolol to 50 mg once a day We will get you set up with ophthalmology sooner!

## 2017-03-17 ENCOUNTER — Telehealth: Payer: Self-pay | Admitting: Family Medicine

## 2017-03-17 NOTE — Telephone Encounter (Signed)
Patient dropped off Paperwork, Place in front office tray.

## 2017-03-19 ENCOUNTER — Encounter: Payer: Self-pay | Admitting: Family Medicine

## 2017-03-19 NOTE — Telephone Encounter (Signed)
Forwarded paperwork to provider with OV notes/SLS 03/07

## 2017-03-19 NOTE — Telephone Encounter (Signed)
Copied from [place] [phone]. Topic: Quick Communication - See Telephone Encounter >> Mar 19, 2017  4:36 PM Rosalin Hawking wrote: CRM for notification. See Telephone encounter for:  03/19/17.   Pt came in office and dropped off her paperwork to be filled out by provider. (Document Short term disability- 3 pages with front and back in a small white envelope). Pt states needs paperwork done by 03-24-2017 (pt was informed about 5-7 business day) and would like to be called at [phone] when ready. Document put at front office tray under providers name.

## 2017-03-23 LAB — HM DIABETES EYE EXAM

## 2017-03-24 NOTE — Telephone Encounter (Signed)
Caller Name: Minna Antis w/[employer] Phone: [phone] Claim # [id number]  Reason for CRM: Minna Antis is requesting OV notes 03/16/17 and CT report 03/12/17. He is also needing the form for return to work status updated to notify if the pt is being kept out of work until the next [place] 03/30/17.

## 2017-03-25 ENCOUNTER — Encounter: Payer: Self-pay | Admitting: Family Medicine

## 2017-03-29 NOTE — Progress Notes (Addendum)
[place] [facility] [address] [place], [place] [address] [phone]  Date:  03/30/2017   Name:  Nicole Hubbard   DOB:  12/01/1965   MRN:  [id number]  PCP:  Darreld Mclean, MD    Chief Complaint: Follow-up (Pt here for f/u visit ) and Medication Refill (Pt would like refill on cyclobenzaprine. )   History of Present Illness:  Nicole Hubbard is a 52 y.o. very pleasant female patient who presents with the following:  Following up on her DM today See last couple of visits From our last visit on 3/4:  I saw her in April of 18, and then not again until 2/18 In that interim her A1c went from 10.9 to 14.8- this is why we started insulin  I last saw her 2/13 to start on once a day insulin glargine as her A1c was very high She was in again last week with concern of her glucose running high still She is on 22 units of glargine now, was not sure if she should continue to go up  She is also here today as her vision is causing difficulty for her at work- she spends all day on a computer.  Straining to see the computer seems to be causing headaches She has been OOW since this past Wednesday 2/27 due to her vision concerns She has been to optometry a few times, but not to ophthalmology ever.   [facility] has her on their schedule but not until May- we will try to move this up She has been concerned as her glucose readings are still in the 200s and sometimes in the 300s.  Explained that her recent A1c of 14.8 is consistent with average sugar of 380, so her glucose has likely been very high for some time, she was just not aware.  She is making progress  She is not able to tolerate metformin For BP she is on amlodipine 10 Losartan 100 hctz 25 toprol xo 25 which Edward added just recently However her home BP are still running quite high- she brings in a list of readings above goal.  As her pulse is still near 100 can  increase her BB dose  - increased to 50 mg  She is tolerating the glimepiride well Her glucose is still in the 200s generally - no lows noted She is now on 26 units of insulin glargine This am her glucose was 236  She saw her optho- she does have bilateral cataracts but they do not plan to repair in the immediate near future No glaucoma They also think that her glucose is contributing to her blurred vision  Her new glasses rx is pending- she should be able to pick up later this week or next week  She is not ready to RTW as of yet as her vision is still not adequate for her to do her job- we discussed and by next Wednesday she should have her glasses, plus we are adjusting her glucose treatment again.  Will plan to have her RTW on 3/27.  This is her return to work date   She is trying to exercise as best as she is able    Lab Results  Component Value Date   HGBA1C 14.8 (H) 02/18/2017    Patient Active Problem List   Diagnosis Date Noted  . Sleep disorder 05/05/2016  . Chronic insomnia 05/05/2016  . DDD (degenerative disc  disease), lumbar 03/19/2015  . MDD (major depressive disorder), recurrent severe, without psychosis ([place]) 08/17/2013  . Morbid obesity ([place]) 06/30/2012  . Depression 01/26/2012  . Diabetes mellitus, type 2 ([place]) 01/26/2012  . HTN (hypertension) 01/26/2012    Past Medical History:  Diagnosis Date  . Abnormal Pap smear of cervix   . Anxiety   . Chest pain   . Complication of anesthesia    Pt stated "I gagged out after having ablation at [facility]"  . Depression   . Diabetes mellitus without complication ([place])   . Hypertension   . Migraine   . Sleep apnea    does not wear CPAP    Past Surgical History:  Procedure Laterality Date  . BACK SURGERY  2017  . KNEE SURGERY Right    Torn meniscus  . TONSILLECTOMY    . uterine ablation      Social History   Tobacco Use  . Smoking status: Former Smoker    Types: Cigarettes    Last attempt to  quit: 09/15/2008    Years since quitting: 8.5  . Smokeless tobacco: Never Used  Substance Use Topics  . Alcohol use: Yes    Alcohol/week: 1.2 oz    Types: 2 Glasses of wine per week    Comment: occasional  . Drug use: No    Family History  Problem Relation Age of Onset  . Cancer Father        urethea  . Diabetes Mother   . Hypertension Mother     Allergies  Allergen Reactions  . Metformin And Related     Pt stopped due to persistent vomiting    Medication list has been reviewed and updated.  Current Outpatient Medications on File Prior to Visit  Medication Sig Dispense Refill  . albuterol (PROVENTIL HFA;VENTOLIN HFA) 108 (90 Base) MCG/ACT inhaler Inhale 1-2 puffs into the lungs every 6 (six) hours as needed for wheezing or shortness of breath. 18 g 5  . amLODipine (NORVASC) 10 MG tablet TAKE 1 TABLET (10 MG TOTAL) BY MOUTH DAILY. 90 tablet 3  . blood glucose meter kit and supplies KIT Dispense based on patient and insurance preference. Use up to four times daily as directed. (FOR ICD-9 250.00, 250.01). Strips with 11 refills  Lancets with 11 refills 1 each 0  . citalopram (CELEXA) 40 MG tablet TAKE 1 TABLET BY MOUTH DAILY FOR DEPRESSION 90 tablet 3  . cyclobenzaprine (FLEXERIL) 10 MG tablet Take 1 tablet (10 mg total) by mouth 2 (two) times daily as needed for muscle spasms. 30 tablet 0  . glimepiride (AMARYL) 2 MG tablet Take 1 tablet (2 mg total) by mouth daily before breakfast. 30 tablet 3  . glucose blood (ACCU-CHEK AVIVA) test strip Use as instructed to check blood sugar up to four times daily.  DX  E11.9 400 each 1  . hydrochlorothiazide (HYDRODIURIL) 25 MG tablet TAKE 1 TABLET (25 MG TOTAL) BY MOUTH DAILY. FOR HIGH BLOOD PRESSURE 90 tablet 2  . Insulin Glargine (BASAGLAR KWIKPEN) 100 UNIT/ML SOPN Start with 10 units daily and titrate as directed 15 mL 6  . Insulin Pen Needle (PEN NEEDLES) 31G X 5 MM MISC 1 pen by Does not apply route daily. 100 each prn  . losartan  (COZAAR) 100 MG tablet TAKE 1 TABLET (100 MG TOTAL) BY MOUTH DAILY. FOR HIGH BLOOD PRESSURE 90 tablet 3  . metoprolol succinate (TOPROL-XL) 50 MG 24 hr tablet Take 1 tablet (50 mg total) by mouth  daily. 30 tablet 11  . pravastatin (PRAVACHOL) 40 MG tablet TAKE 1 TABLET (40 MG TOTAL) BY MOUTH DAILY. 90 tablet 3   No current facility-administered medications on file prior to visit.     Review of Systems:  As per HPI- otherwise negative.   Physical Examination: Vitals:   03/30/17 1115  BP: 124/84  Pulse: 95  Temp: 97.9 F (36.6 C)  SpO2: 95%   Vitals:   03/30/17 1115  Weight: (!) 305 lb 6.4 oz (138.5 kg)  Height: '5\' 8"'  (1.727 m)   Body mass index is 46.44 kg/m. Ideal Body Weight: Weight in (lb) to have BMI = 25: 164.1  GEN: WDWN, NAD, Non-toxic, A & O x 3, looks well, obese HEENT: Atraumatic, Normocephalic. Neck supple. No masses, No LAD. Ears and Nose: No external deformity. CV: RRR, No M/G/R. No JVD. No thrill. No extra heart sounds. PULM: CTA B, no wheezes, crackles, rhonchi. No retractions. No resp. distress. No accessory muscle use. ABD: S, NT, ND, +BS. No rebound. No HSM. EXTR: No c/c/e NEURO Normal gait.  PSYCH: Normally interactive. Conversant. Not depressed or anxious appearing.  Calm demeanor.    Assessment and Plan: Uncontrolled type 2 diabetes mellitus without complication, with long-term current use of insulin (HCC) - Plan: glimepiride (AMARYL) 4 MG tablet  Muscle spasm - Plan: cyclobenzaprine (FLEXERIL) 10 MG tablet  Blurred vision  Essential hypertension  Here today for a recheck-  Increase her glimepiride to 77m, she will continue to titrate her insulin glargine Note for her to RTW on 3/27- next wed  She will see me in 3-4 weeks for an A1c check Encouraged continued exercise    Signed JLamar Blinks MD

## 2017-03-30 ENCOUNTER — Ambulatory Visit (INDEPENDENT_AMBULATORY_CARE_PROVIDER_SITE_OTHER): Payer: 59 | Admitting: Family Medicine

## 2017-03-30 ENCOUNTER — Encounter: Payer: Self-pay | Admitting: Family Medicine

## 2017-03-30 VITALS — BP 124/84 | HR 95 | Temp 97.9°F | Ht 68.0 in | Wt 305.4 lb

## 2017-03-30 DIAGNOSIS — Z794 Long term (current) use of insulin: Secondary | ICD-10-CM

## 2017-03-30 DIAGNOSIS — I10 Essential (primary) hypertension: Secondary | ICD-10-CM | POA: Diagnosis not present

## 2017-03-30 DIAGNOSIS — E1165 Type 2 diabetes mellitus with hyperglycemia: Secondary | ICD-10-CM | POA: Diagnosis not present

## 2017-03-30 DIAGNOSIS — H538 Other visual disturbances: Secondary | ICD-10-CM | POA: Diagnosis not present

## 2017-03-30 DIAGNOSIS — M62838 Other muscle spasm: Secondary | ICD-10-CM | POA: Diagnosis not present

## 2017-03-30 DIAGNOSIS — IMO0001 Reserved for inherently not codable concepts without codable children: Secondary | ICD-10-CM

## 2017-03-30 MED ORDER — GLIMEPIRIDE 4 MG PO TABS: 4.0000 mg | ORAL_TABLET | Freq: Every day | ORAL | 3 refills | Status: DC

## 2017-03-30 MED ORDER — CYCLOBENZAPRINE HCL 10 MG PO TABS: 10.0000 mg | ORAL_TABLET | Freq: Two times a day (BID) | ORAL | 0 refills | Status: DC | PRN

## 2017-03-30 NOTE — Patient Instructions (Addendum)
Increase your glimepiride to 4 mg daily- you can take 2 of the 2mg  tabs until gone We will plan to have you return to work next Wednesday if you are able It seems that you are making good progress!   Please keep me posted about your blood sugars and see me in 3-4 weeks

## 2017-03-31 NOTE — Telephone Encounter (Signed)
Office visit noted have been faxed.

## 2017-04-01 ENCOUNTER — Encounter: Payer: Self-pay | Admitting: Obstetrics & Gynecology

## 2017-04-01 ENCOUNTER — Ambulatory Visit (INDEPENDENT_AMBULATORY_CARE_PROVIDER_SITE_OTHER): Payer: 59 | Admitting: Obstetrics & Gynecology

## 2017-04-01 VITALS — BP 131/89 | HR 91 | Ht 68.0 in | Wt 310.0 lb

## 2017-04-01 DIAGNOSIS — N950 Postmenopausal bleeding: Secondary | ICD-10-CM

## 2017-04-01 NOTE — Progress Notes (Signed)
Patient reports here for follow up from [facility].

## 2017-04-01 NOTE — Patient Instructions (Signed)
Hysteroscopy  Hysteroscopy is a procedure used for looking inside the womb (uterus). It may be done for various reasons, including:  · To evaluate abnormal bleeding, fibroid (benign, noncancerous) tumors, polyps, scar tissue (adhesions), and possibly cancer of the uterus.  · To look for lumps (tumors) and other uterine growths.  · To look for causes of why a woman cannot get pregnant (infertility), causes of recurrent loss of pregnancy (miscarriages), or a lost intrauterine device (IUD).  · To perform a sterilization by blocking the fallopian tubes from inside the uterus.    In this procedure, a thin, flexible tube with a tiny light and camera on the end of it (hysteroscope) is used to look inside the uterus. A hysteroscopy should be done right after a menstrual period to be sure you are not pregnant.  LET YOUR HEALTH CARE PROVIDER KNOW ABOUT:  · Any allergies you have.  · All medicines you are taking, including vitamins, herbs, eye drops, creams, and over-the-counter medicines.  · Previous problems you or members of your family have had with the use of anesthetics.  · Any blood disorders you have.  · Previous surgeries you have had.  · Medical conditions you have.  RISKS AND COMPLICATIONS  Generally, this is a safe procedure. However, as with any procedure, complications can occur. Possible complications include:  · Putting a hole in the uterus.  · Excessive bleeding.  · Infection.  · Damage to the cervix.  · Injury to other organs.  · Allergic reaction to medicines.  · Too much fluid used in the uterus for the procedure.    BEFORE THE PROCEDURE  · Ask your health care provider about changing or stopping any regular medicines.  · Do not take aspirin or blood thinners for 1 week before the procedure, or as directed by your health care provider. These can cause bleeding.  · If you smoke, do not smoke for 2 weeks before the procedure.  · In some cases, a medicine is placed in the cervix the day before the procedure.  This medicine makes the cervix have a larger opening (dilate). This makes it easier for the instrument to be inserted into the uterus during the procedure.  · Do not eat or drink anything for at least 8 hours before the surgery.  · Arrange for someone to take you home after the procedure.  PROCEDURE  · You may be given a medicine to relax you (sedative). You may also be given one of the following:  ? A medicine that numbs the area around the cervix (local anesthetic).  ? A medicine that makes you sleep through the procedure (general anesthetic).  · The hysteroscope is inserted through the vagina into the uterus. The camera on the hysteroscope sends a picture to a TV screen. This gives the surgeon a good view inside the uterus.  · During the procedure, air or a liquid is put into the uterus, which allows the surgeon to see better.  · Sometimes, tissue is gently scraped from inside the uterus. These tissue samples are sent to a lab for testing.  What to expect after the procedure  · If you had a general anesthetic, you may be groggy for a couple hours after the procedure.  · If you had a local anesthetic, you will be able to go home as soon as you are stable and feel ready.  · You may have some cramping. This normally lasts for a couple days.  · You may   have bleeding, which varies from light spotting for a few days to menstrual-like bleeding for 3-7 days. This is normal.  · If your test results are not back during the visit, make an appointment with your health care provider to find out the results.  This information is not intended to replace advice given to you by your health care provider. Make sure you discuss any questions you have with your health care provider.  Document Released: 04/07/2000 Document Revised: 06/07/2015 Document Reviewed: 07/29/2012  Elsevier Interactive Patient Education © 2017 Elsevier Inc.

## 2017-04-03 ENCOUNTER — Telehealth: Payer: Self-pay | Admitting: Family Medicine

## 2017-04-03 ENCOUNTER — Encounter: Payer: Self-pay | Admitting: Obstetrics & Gynecology

## 2017-04-03 NOTE — H&P (View-Only) (Signed)
GYNECOLOGY OFFICE VISIT NOTE  History:  52 y.o. G2P1011 here today for follow up after evaluation for postmenopausal bleeding s/p endometrial ablation. She had an ultrasound (see findings below) and inadequate endometrial biopsy (results below).  No current bleeding. She denies any abnormal vaginal discharge, pelvic pain or other concerns.   Past Medical History:  Diagnosis Date  . Abnormal Pap smear of cervix   . Anxiety   . Chest pain   . Complication of anesthesia    Pt stated "I gagged out after having ablation at [facility]"  . Depression   . Diabetes mellitus without complication ([place])   . Hypertension   . Migraine   . Sleep apnea    does not wear CPAP    Past Surgical History:  Procedure Laterality Date  . BACK SURGERY  2017  . KNEE SURGERY Right    Torn meniscus  . TONSILLECTOMY    . uterine ablation      The following portions of the patient's history were reviewed and updated as appropriate: allergies, current medications, past family history, past medical history, past social history, past surgical history and problem list.   Health Maintenance:  Normal pap and negative HRHPV on 02/18/2017.  Normal mammogram on 07/11/2015.   Review of Systems:  Pertinent items noted in HPI and remainder of comprehensive ROS otherwise negative.  Objective:  Physical Exam BP 131/89   Pulse 91   Ht 5\' 8"  (1.727 m)   Wt (!) 310 lb (140.6 kg)   LMP 02/03/2014   BMI 47.14 kg/m  CONSTITUTIONAL: Well-developed, well-nourished female in no acute distress.  HENT:  Normocephalic, atraumatic. External right and left ear normal. Oropharynx is clear and moist EYES: Conjunctivae and EOM are normal. Pupils are equal, round, and reactive to light. No scleral icterus.  NECK: Normal range of motion, supple, no masses SKIN: Skin is warm and dry. No rash noted. Not diaphoretic. No erythema. No pallor. NEUROLOGIC: Alert and oriented to person, place, and time. Normal reflexes, muscle tone  coordination. No cranial nerve deficit noted. PSYCHIATRIC: Normal mood and affect. Normal behavior. Normal judgment and thought content. CARDIOVASCULAR: Normal heart rate noted RESPIRATORY: Effort and breath sounds normal, no problems with respiration noted ABDOMEN: Soft, no distention noted.   PELVIC: Deferred MUSCULOSKELETAL: Normal range of motion. No edema noted.  Labs and Imaging Ct Head Wo Contrast  Result Date: 03/12/2017 CLINICAL DATA:  Frontal headache and blurred vision EXAM: CT HEAD WITHOUT CONTRAST TECHNIQUE: Contiguous axial images were obtained from the base of the skull through the vertex without intravenous contrast. COMPARISON:  None. FINDINGS: Brain: The ventricles are normal in size and configuration. There is mild cerebellar atrophy. There is a cavum septum pellucidum, an anatomic variant. There is no intracranial mass, hemorrhage, extra-axial fluid collection, or midline shift. Gray-white compartments appear normal. No evident acute infarct. Vascular: No hyperdense vessel. No appreciable vascular calcification. Skull: The bony calvarium appears intact. Sinuses/Orbits: Visualized paranasal sinuses are clear. Visualized orbits appear symmetric bilaterally. Other: Visualized mastoid air cells are clear. IMPRESSION: Mild cerebellar atrophy.  Study otherwise unremarkable. Electronically Signed   By: Lowella Grip III M.D.   On: 03/12/2017 10:00   US Pelvis Transvanginal Non-ob (tv Only)  Result Date: 03/09/2017 CLINICAL DATA:  Postmenopausal bleeding for 2 years, prior endometrial ablation EXAM: TRANSABDOMINAL AND TRANSVAGINAL ULTRASOUND OF PELVIS TECHNIQUE: Both transabdominal and transvaginal ultrasound examinations of the pelvis were performed. Transabdominal technique was performed for global imaging of the pelvis including uterus, ovaries, adnexal  regions, and pelvic cul-de-sac. It was necessary to proceed with endovaginal exam following the transabdominal exam to visualize  the endometrium and ovaries. COMPARISON:  11/18/2006 FINDINGS: Uterus Measurements: 8.6 x 3.8 x 4.1 cm. Mildly heterogeneous myometrial echogenicity. Complicated nabothian cysts at cervix. Subtle hypoechoic nodule at anterior LEFT uterus 10 x 7 x 7 mm. No additional uterine masses. Endometrium Thickness: 11 mm thick. Suspected hypoechoic nodule within endometrium at upper uterine segment 12 x 7 x 10 mm. No endometrial fluid. Right ovary Measurements: 1.6 x 2.9 x 2.3 cm.  Normal morphology without mass Left ovary Measurements: 2.4 x 1.1 x 2.1 cm.  Normal morphology without mass Other findings Trace free pelvic fluid.  No adnexal masses. IMPRESSION: Suspected small submucosal leiomyoma at the upper LEFT uterus 10 mm diameter. Abnormal thickening of the endometrial complex 11 mm thick with potential 12 x 7 x 10 mm endometrial nodule at upper uterine segment; in the setting of post-menopausal bleeding, endometrial sampling is indicated to exclude carcinoma. If results are benign, sonohysterogram should be considered for focal lesion work-up. (Ref: Radiological Reasoning: Algorithmic Workup of Abnormal Vaginal Bleeding with Endovaginal Sonography and Sonohysterography. AJR 2008; [id number]:F81-01) Electronically Signed   By: Lavonia Dana M.D.   On: 03/09/2017 16:12   US Pelvis (transabdominal Only)  Result Date: 03/09/2017 CLINICAL DATA:  Postmenopausal bleeding for 2 years, prior endometrial ablation EXAM: TRANSABDOMINAL AND TRANSVAGINAL ULTRASOUND OF PELVIS TECHNIQUE: Both transabdominal and transvaginal ultrasound examinations of the pelvis were performed. Transabdominal technique was performed for global imaging of the pelvis including uterus, ovaries, adnexal regions, and pelvic cul-de-sac. It was necessary to proceed with endovaginal exam following the transabdominal exam to visualize the endometrium and ovaries. COMPARISON:  11/18/2006 FINDINGS: Uterus Measurements: 8.6 x 3.8 x 4.1 cm. Mildly heterogeneous myometrial  echogenicity. Complicated nabothian cysts at cervix. Subtle hypoechoic nodule at anterior LEFT uterus 10 x 7 x 7 mm. No additional uterine masses. Endometrium Thickness: 11 mm thick. Suspected hypoechoic nodule within endometrium at upper uterine segment 12 x 7 x 10 mm. No endometrial fluid. Right ovary Measurements: 1.6 x 2.9 x 2.3 cm.  Normal morphology without mass Left ovary Measurements: 2.4 x 1.1 x 2.1 cm.  Normal morphology without mass Other findings Trace free pelvic fluid.  No adnexal masses. IMPRESSION: Suspected small submucosal leiomyoma at the upper LEFT uterus 10 mm diameter. Abnormal thickening of the endometrial complex 11 mm thick with potential 12 x 7 x 10 mm endometrial nodule at upper uterine segment; in the setting of post-menopausal bleeding, endometrial sampling is indicated to exclude carcinoma. If results are benign, sonohysterogram should be considered for focal lesion work-up. (Ref: Radiological Reasoning: Algorithmic Workup of Abnormal Vaginal Bleeding with Endovaginal Sonography and Sonohysterography. AJR 2008; [id number]:[id number]) Electronically Signed   By: Lavonia Dana M.D.   On: 03/09/2017 16:12    Assessment & Plan:  1. Postmenopausal bleeding Patient had inadequate endometrial biopsy; history of endometrial ablation. Needs endometrial sampling. Discussed need for hysteroscopy, dilation and curettage as further evaluation.  Recommend ultrasound guidance for procedure given endometrial ablation and risk of false passage.  The risks of surgery were discussed in detail with the patient including but not limited to: bleeding which may require transfusion; infection which may require antibiotics; injury to uterus or surrounding organs; intrauterine scarring; need for additional procedures including laparotomy or laparoscopy; and other postoperative/anesthesia complications.  All questions were answered.  She was told that she will be contacted by our surgical scheduler regarding the time and  date of  her surgery; routine preoperative instructions will be given to her  She was told she may be called for a preoperative appointment about a week prior to surgery and will be given further preoperative instructions at that visit. Printed patient education handouts about the procedure were given to the patient to review at home.  Routine preventative health maintenance measures emphasized. Please refer to After Visit Summary for other counseling recommendations.   Return if symptoms worsen or fail to improve.   Total face-to-face time with patient: 25 minutes.  Over 50% of encounter was spent on counseling and coordination of care.   Verita Schneiders, MD, [place] for [employer], [place]

## 2017-04-03 NOTE — Progress Notes (Signed)
GYNECOLOGY OFFICE VISIT NOTE  History:  52 y.o. G2P1011 here today for follow up after evaluation for postmenopausal bleeding s/p endometrial ablation. She had an ultrasound (see findings below) and inadequate endometrial biopsy (results below).  No current bleeding. She denies any abnormal vaginal discharge, pelvic pain or other concerns.   Past Medical History:  Diagnosis Date  . Abnormal Pap smear of cervix   . Anxiety   . Chest pain   . Complication of anesthesia    Pt stated "I gagged out after having ablation at [facility]"  . Depression   . Diabetes mellitus without complication ([place])   . Hypertension   . Migraine   . Sleep apnea    does not wear CPAP    Past Surgical History:  Procedure Laterality Date  . BACK SURGERY  2017  . KNEE SURGERY Right    Torn meniscus  . TONSILLECTOMY    . uterine ablation      The following portions of the patient's history were reviewed and updated as appropriate: allergies, current medications, past family history, past medical history, past social history, past surgical history and problem list.   Health Maintenance:  Normal pap and negative HRHPV on 02/18/2017.  Normal mammogram on 07/11/2015.   Review of Systems:  Pertinent items noted in HPI and remainder of comprehensive ROS otherwise negative.  Objective:  Physical Exam BP 131/89   Pulse 91   Ht 5\' 8"  (1.727 m)   Wt (!) 310 lb (140.6 kg)   LMP 02/03/2014   BMI 47.14 kg/m  CONSTITUTIONAL: Well-developed, well-nourished female in no acute distress.  HENT:  Normocephalic, atraumatic. External right and left ear normal. Oropharynx is clear and moist EYES: Conjunctivae and EOM are normal. Pupils are equal, round, and reactive to light. No scleral icterus.  NECK: Normal range of motion, supple, no masses SKIN: Skin is warm and dry. No rash noted. Not diaphoretic. No erythema. No pallor. NEUROLOGIC: Alert and oriented to person, place, and time. Normal reflexes, muscle tone  coordination. No cranial nerve deficit noted. PSYCHIATRIC: Normal mood and affect. Normal behavior. Normal judgment and thought content. CARDIOVASCULAR: Normal heart rate noted RESPIRATORY: Effort and breath sounds normal, no problems with respiration noted ABDOMEN: Soft, no distention noted.   PELVIC: Deferred MUSCULOSKELETAL: Normal range of motion. No edema noted.  Labs and Imaging Ct Head Wo Contrast  Result Date: 03/12/2017 CLINICAL DATA:  Frontal headache and blurred vision EXAM: CT HEAD WITHOUT CONTRAST TECHNIQUE: Contiguous axial images were obtained from the base of the skull through the vertex without intravenous contrast. COMPARISON:  None. FINDINGS: Brain: The ventricles are normal in size and configuration. There is mild cerebellar atrophy. There is a cavum septum pellucidum, an anatomic variant. There is no intracranial mass, hemorrhage, extra-axial fluid collection, or midline shift. Gray-white compartments appear normal. No evident acute infarct. Vascular: No hyperdense vessel. No appreciable vascular calcification. Skull: The bony calvarium appears intact. Sinuses/Orbits: Visualized paranasal sinuses are clear. Visualized orbits appear symmetric bilaterally. Other: Visualized mastoid air cells are clear. IMPRESSION: Mild cerebellar atrophy.  Study otherwise unremarkable. Electronically Signed   By: Lowella Grip III M.D.   On: 03/12/2017 10:00   US Pelvis Transvanginal Non-ob (tv Only)  Result Date: 03/09/2017 CLINICAL DATA:  Postmenopausal bleeding for 2 years, prior endometrial ablation EXAM: TRANSABDOMINAL AND TRANSVAGINAL ULTRASOUND OF PELVIS TECHNIQUE: Both transabdominal and transvaginal ultrasound examinations of the pelvis were performed. Transabdominal technique was performed for global imaging of the pelvis including uterus, ovaries, adnexal  regions, and pelvic cul-de-sac. It was necessary to proceed with endovaginal exam following the transabdominal exam to visualize  the endometrium and ovaries. COMPARISON:  11/18/2006 FINDINGS: Uterus Measurements: 8.6 x 3.8 x 4.1 cm. Mildly heterogeneous myometrial echogenicity. Complicated nabothian cysts at cervix. Subtle hypoechoic nodule at anterior LEFT uterus 10 x 7 x 7 mm. No additional uterine masses. Endometrium Thickness: 11 mm thick. Suspected hypoechoic nodule within endometrium at upper uterine segment 12 x 7 x 10 mm. No endometrial fluid. Right ovary Measurements: 1.6 x 2.9 x 2.3 cm.  Normal morphology without mass Left ovary Measurements: 2.4 x 1.1 x 2.1 cm.  Normal morphology without mass Other findings Trace free pelvic fluid.  No adnexal masses. IMPRESSION: Suspected small submucosal leiomyoma at the upper LEFT uterus 10 mm diameter. Abnormal thickening of the endometrial complex 11 mm thick with potential 12 x 7 x 10 mm endometrial nodule at upper uterine segment; in the setting of post-menopausal bleeding, endometrial sampling is indicated to exclude carcinoma. If results are benign, sonohysterogram should be considered for focal lesion work-up. (Ref: Radiological Reasoning: Algorithmic Workup of Abnormal Vaginal Bleeding with Endovaginal Sonography and Sonohysterography. AJR 2008; 182:X93-71) Electronically Signed   By: Lavonia Dana M.D.   On: 03/09/2017 16:12   US Pelvis (transabdominal Only)  Result Date: 03/09/2017 CLINICAL DATA:  Postmenopausal bleeding for 2 years, prior endometrial ablation EXAM: TRANSABDOMINAL AND TRANSVAGINAL ULTRASOUND OF PELVIS TECHNIQUE: Both transabdominal and transvaginal ultrasound examinations of the pelvis were performed. Transabdominal technique was performed for global imaging of the pelvis including uterus, ovaries, adnexal regions, and pelvic cul-de-sac. It was necessary to proceed with endovaginal exam following the transabdominal exam to visualize the endometrium and ovaries. COMPARISON:  11/18/2006 FINDINGS: Uterus Measurements: 8.6 x 3.8 x 4.1 cm. Mildly heterogeneous myometrial  echogenicity. Complicated nabothian cysts at cervix. Subtle hypoechoic nodule at anterior LEFT uterus 10 x 7 x 7 mm. No additional uterine masses. Endometrium Thickness: 11 mm thick. Suspected hypoechoic nodule within endometrium at upper uterine segment 12 x 7 x 10 mm. No endometrial fluid. Right ovary Measurements: 1.6 x 2.9 x 2.3 cm.  Normal morphology without mass Left ovary Measurements: 2.4 x 1.1 x 2.1 cm.  Normal morphology without mass Other findings Trace free pelvic fluid.  No adnexal masses. IMPRESSION: Suspected small submucosal leiomyoma at the upper LEFT uterus 10 mm diameter. Abnormal thickening of the endometrial complex 11 mm thick with potential 12 x 7 x 10 mm endometrial nodule at upper uterine segment; in the setting of post-menopausal bleeding, endometrial sampling is indicated to exclude carcinoma. If results are benign, sonohysterogram should be considered for focal lesion work-up. (Ref: Radiological Reasoning: Algorithmic Workup of Abnormal Vaginal Bleeding with Endovaginal Sonography and Sonohysterography. AJR 2008; 696:V89-38) Electronically Signed   By: Lavonia Dana M.D.   On: 03/09/2017 16:12    Assessment & Plan:  1. Postmenopausal bleeding Patient had inadequate endometrial biopsy; history of endometrial ablation. Needs endometrial sampling. Discussed need for hysteroscopy, dilation and curettage as further evaluation.  Recommend ultrasound guidance for procedure given endometrial ablation and risk of false passage.  The risks of surgery were discussed in detail with the patient including but not limited to: bleeding which may require transfusion; infection which may require antibiotics; injury to uterus or surrounding organs; intrauterine scarring; need for additional procedures including laparotomy or laparoscopy; and other postoperative/anesthesia complications.  All questions were answered.  She was told that she will be contacted by our surgical scheduler regarding the time and  date of  her surgery; routine preoperative instructions will be given to her  She was told she may be called for a preoperative appointment about a week prior to surgery and will be given further preoperative instructions at that visit. Printed patient education handouts about the procedure were given to the patient to review at home.  Routine preventative health maintenance measures emphasized. Please refer to After Visit Summary for other counseling recommendations.   Return if symptoms worsen or fail to improve.   Total face-to-face time with patient: 25 minutes.  Over 50% of encounter was spent on counseling and coordination of care.   Verita Schneiders, MD, [place] for [employer], [place]

## 2017-04-03 NOTE — Telephone Encounter (Signed)
Paperwork refaxed to [phone] today 04/03/17, fax confirmation received. Originally faxed on 03/26/17.

## 2017-04-03 NOTE — Telephone Encounter (Signed)
Copied from [place]. Topic: Quick Communication - See Telephone Encounter >> Apr 03, 2017 10:54 AM Lolita Rieger, RMA wrote: CRM for notification. See Telephone encounter for: 04/03/17.pt needs office to refax info pertaining a recent claim #[id number] fax to [phone] they stated that they never received fax from the office and pt would like for it to be re-faxed

## 2017-04-06 ENCOUNTER — Encounter (HOSPITAL_COMMUNITY): Payer: Self-pay

## 2017-04-07 ENCOUNTER — Encounter: Payer: Self-pay | Admitting: Family Medicine

## 2017-04-08 ENCOUNTER — Encounter: Payer: Self-pay | Admitting: Family Medicine

## 2017-04-09 ENCOUNTER — Encounter: Payer: Self-pay | Admitting: Family Medicine

## 2017-04-09 DIAGNOSIS — H538 Other visual disturbances: Secondary | ICD-10-CM

## 2017-04-09 NOTE — Addendum Note (Signed)
Addended by: Lamar Blinks C on: 04/09/2017 09:50 PM   Modules accepted: Orders

## 2017-04-14 ENCOUNTER — Other Ambulatory Visit (HOSPITAL_COMMUNITY): Payer: Self-pay

## 2017-04-14 DIAGNOSIS — Z419 Encounter for procedure for purposes other than remedying health state, unspecified: Secondary | ICD-10-CM

## 2017-04-14 DIAGNOSIS — N950 Postmenopausal bleeding: Secondary | ICD-10-CM

## 2017-04-14 NOTE — Telephone Encounter (Signed)
Office Notes and Imaging results requested have been faxed to [facility] via [employer]. I do not have any other papers [ie: form for RTW status updated]; will forward to provider/SLS 04/02

## 2017-04-21 NOTE — Progress Notes (Addendum)
Nicole Hubbard at [facility] [address] [place], [place] [address] [phone]  Date:  04/22/2017   Name:  Nicole Hubbard   DOB:  [birth year]   MRN:  [id number]  PCP:  Darreld Mclean, MD    Chief Complaint: Follow-up (Pt here for 3-4 week follow up visit. ) and Medication Refill (Refill needed on [facility]. Pt states that she is now using 30 units daily. )   History of Present Illness:  Nicole Hubbard is a 52 y.o. very pleasant female patient who presents with the following:  Short term follow-up today History of depression, obesity, DM, HTN  Lab Results  Component Value Date   HGBA1C 12.4 (H) 04/22/2017   Recently her DM has been under poor control, we added glimepiride to her regimen.  She has ben experiencing reduced vision we think due to her high blood sugars From our last visit on 3/18: She is tolerating the glimepiride well Her glucose is still in the 200s generally - no lows noted She is now on 26 units of insulin glargine This am her glucose was 236  She did go back to her eye doctor (Dr .Satira Sark with [facility] ophthalmology)- given omega 3 and eye drops.  They otherwise did not see any specific treatment that would help at this time.  we hope that her vision will improve as we continue to get her glucose under control. She is worried about her vision and is having trouble seeing, she is back at work but this is a challenge due to her vision. She works on a computer all day and cannot work at her normal pace due to vision change She was out of work on 4/4 and 4/5 due to especially bad vision and needs me to update her FMLA Her grandfather lost his sight, and she is not sure why - she is really afraid that she will go blind as well She is starting to think about going on permanent disability; she is worried about trying to support herself, and she is frustrated by not being able to do her job as she would like. She also finds  that her old depression sx are becoming more problematic due to this stress.  She is not struggling with any "thoughts of killing myself or anything like that," but wonders if the stress of trying to keep working is damaging her mental health   She is on glimepiride 77m once a day She is on 30 units of glargine now She has not had any low blood sugars She brings in a few readings- running 200- 270 still   She wants to go back on STD- claim number is [id number]  I will need to fax note from today to [phone]  BP Readings from Last 3 Encounters:  04/22/17 124/84  04/01/17 131/89  03/30/17 124/84    Patient Active Problem List   Diagnosis Date Noted  . Sleep disorder 05/05/2016  . Chronic insomnia 05/05/2016  . DDD (degenerative disc disease), lumbar 03/19/2015  . MDD (major depressive disorder), recurrent severe, without psychosis ([place] 08/17/2013  . Morbid obesity ([place] 06/30/2012  . Depression 01/26/2012  . Diabetes mellitus, type 2 ([facility] 01/26/2012  . HTN (hypertension) 01/26/2012    Past Medical History:  Diagnosis Date  . Abnormal Pap smear of cervix   . Anxiety   . Asthma   . Chest pain   . Complication of anesthesia  Pt stated "I gagged out after having ablation at [facility]"  . Depression   . Diabetes mellitus without complication ([place])   . Hypertension   . Migraine   . Sleep apnea    does not wear CPAP    Past Surgical History:  Procedure Laterality Date  . BACK SURGERY  2017  . KNEE SURGERY Right    Torn meniscus  . TONSILLECTOMY    . uterine ablation      Social History   Tobacco Use  . Smoking status: Former Smoker    Types: Cigarettes    Last attempt to quit: 09/15/2008    Years since quitting: 8.6  . Smokeless tobacco: Never Used  Substance Use Topics  . Alcohol use: Yes    Alcohol/week: 1.2 oz    Types: 2 Glasses of wine per week    Comment: occasional  . Drug use: No    Family History  Problem Relation Age of Onset  .  Cancer Father        urethea  . Diabetes Mother   . Hypertension Mother     Allergies  Allergen Reactions  . Metformin And Related     Pt stopped due to persistent vomiting    Medication list has been reviewed and updated.  Current Outpatient Medications on File Prior to Visit  Medication Sig Dispense Refill  . albuterol (PROVENTIL HFA;VENTOLIN HFA) 108 (90 Base) MCG/ACT inhaler Inhale 1-2 puffs into the lungs every 6 (six) hours as needed for wheezing or shortness of breath. 18 g 5  . amLODipine (NORVASC) 10 MG tablet TAKE 1 TABLET (10 MG TOTAL) BY MOUTH DAILY. 90 tablet 3  . blood glucose meter kit and supplies KIT Dispense based on patient and insurance preference. Use up to four times daily as directed. (FOR ICD-9 250.00, 250.01). Strips with 11 refills  Lancets with 11 refills 1 each 0  . citalopram (CELEXA) 40 MG tablet TAKE 1 TABLET BY MOUTH DAILY FOR DEPRESSION 90 tablet 3  . cyclobenzaprine (FLEXERIL) 10 MG tablet Take 1 tablet (10 mg total) by mouth 2 (two) times daily as needed for muscle spasms. 30 tablet 0  . glucose blood (ACCU-CHEK AVIVA) test strip Use as instructed to check blood sugar up to four times daily.  DX  E11.9 400 each 1  . hydrochlorothiazide (HYDRODIURIL) 25 MG tablet TAKE 1 TABLET (25 MG TOTAL) BY MOUTH DAILY. FOR HIGH BLOOD PRESSURE 90 tablet 2  . Insulin Pen Needle (PEN NEEDLES) 31G X 5 MM MISC 1 pen by Does not apply route daily. 100 each prn  . losartan (COZAAR) 100 MG tablet TAKE 1 TABLET (100 MG TOTAL) BY MOUTH DAILY. FOR HIGH BLOOD PRESSURE 90 tablet 3  . metoprolol succinate (TOPROL-XL) 50 MG 24 hr tablet Take 1 tablet (50 mg total) by mouth daily. 30 tablet 11  . pravastatin (PRAVACHOL) 40 MG tablet TAKE 1 TABLET (40 MG TOTAL) BY MOUTH DAILY. 90 tablet 3   No current facility-administered medications on file prior to visit.     Review of Systems:  As per HPI- otherwise negative. No fever or chills   Physical Examination: Vitals:    04/22/17 1214  BP: 124/84  Pulse: 99  Temp: 97.8 F (36.6 C)  SpO2: 94%   Vitals:   04/22/17 1214  Weight: (!) 312 lb 6.4 oz (141.7 kg)  Height: _0  (1.727 m)   Body mass index is 47.5 kg/m. Ideal Body Weight: Weight in (lb) to have BMI =  25: 164.1  GEN: WDWN, NAD, Non-toxic, A & O x 3, obese, otherwise looks well  HEENT: Atraumatic, Normocephalic. Neck supple. No masses, No LAD. Ears and Nose: No external deformity. CV: RRR, No M/G/R. No JVD. No thrill. No extra heart sounds. PULM: CTA B, no wheezes, crackles, rhonchi. No retractions. No resp. distress. No accessory muscle use. EXTR: No c/c/e NEURO Normal gait.  PSYCH: Normally interactive. Conversant. Not depressed or anxious appearing.  Calm demeanor.    Assessment and Plan: Uncontrolled type 2 diabetes mellitus without complication, with long-term current use of insulin (HCC) - Plan: Hemoglobin Y3K, Basic metabolic panel, glimepiride (AMARYL) 4 MG tablet, Insulin Glargine (BASAGLAR KWIKPEN) 100 UNIT/ML SOPN  Blurred vision  MDD (major depressive disorder), recurrent severe, without psychosis ([place])  Essential hypertension  Nicole Hubbard has a few concerns today Her blood sugar is still high, but improving.  We will check an A1c for her today- it has not been 3 months yet but we would like to see what progress she has made.  Will increase her glimepiride to 6 mg.  After adjusting to this increase she will continue to titrate up her glargine by 2 units every 2 days as long as her FBG is over 150 Her BP is under ok control, continue amlodipine, losartan, toprol  Her vision continues to be an issue.  She is seeing ophthalmology.  Encouraged her that I hope this will improve as we continue to treat her glucose.  However for now her vision is making it hard for her to do her job.  Also, she notes that her depression, which has been severe in the past, seems to be coming back. She is on celexa.  She is not worried about self harm at this  time, but would like to go back on short term disability while we continue to get her vision back to normal.  Struggling to see at work all day seems to be exacerbating her mood sx.   Need to fax in a copy of todays note to her disability provider as well  Will plan further follow- up pending labs. We plan to see her back in the office on 05/28/17 and will plan her exact RTW date at that time, expect she will be out of work for about 6 weeks    Signed Lamar Blinks, MD  Results for orders placed or performed in visit on 04/22/17  Hemoglobin A1c  Result Value Ref Range   Hgb A1c MFr Bld 12.4 (H) 4.6 - 6.5 %  Basic metabolic panel  Result Value Ref Range   Sodium 137 135 - 145 mEq/L   Potassium 3.6 3.5 - 5.1 mEq/L   Chloride 99 96 - 112 mEq/L   CO2 31 19 - 32 mEq/L   Glucose, Bld 216 (H) 70 - 99 mg/dL   BUN 11 6 - 23 mg/dL   Creatinine, Ser 0.77 0.40 - 1.20 mg/dL   Calcium 9.7 8.4 - 10.5 mg/dL   GFR 100.94 >60.00 mL/min   Message to pt with labs 4/11  Your A1c is moving!  This is good news- down by 2 points already.  Metabolic profile is ok Please increase your glimepiride and then continue to titrate up your insulin as we discussed.  Please send me an update regarding how blood sugar and how much insulin you are taking in 1 week.   Let's visit in one month- 6 weeks I have your FMLA papers ready- did you want to pick them up?  Will fax in a copy of your note from yesterday as you requested

## 2017-04-22 ENCOUNTER — Encounter: Payer: Self-pay | Admitting: Family Medicine

## 2017-04-22 ENCOUNTER — Other Ambulatory Visit: Payer: Self-pay

## 2017-04-22 ENCOUNTER — Encounter (HOSPITAL_BASED_OUTPATIENT_CLINIC_OR_DEPARTMENT_OTHER): Payer: Self-pay | Admitting: *Deleted

## 2017-04-22 ENCOUNTER — Encounter (HOSPITAL_BASED_OUTPATIENT_CLINIC_OR_DEPARTMENT_OTHER): Payer: Self-pay

## 2017-04-22 ENCOUNTER — Ambulatory Visit (INDEPENDENT_AMBULATORY_CARE_PROVIDER_SITE_OTHER): Payer: 59 | Admitting: Family Medicine

## 2017-04-22 ENCOUNTER — Encounter (HOSPITAL_BASED_OUTPATIENT_CLINIC_OR_DEPARTMENT_OTHER)
Admission: RE | Admit: 2017-04-22 | Discharge: 2017-04-22 | Disposition: A | Discharge status: Home / Self Care | Payer: 59 | Source: Ambulatory Visit | Attending: Neurology | Admitting: Neurology

## 2017-04-22 VITALS — BP 124/84 | HR 99 | Temp 97.8°F | Ht 68.0 in | Wt 312.4 lb

## 2017-04-22 DIAGNOSIS — I10 Essential (primary) hypertension: Secondary | ICD-10-CM | POA: Insufficient documentation

## 2017-04-22 DIAGNOSIS — F332 Major depressive disorder, recurrent severe without psychotic features: Secondary | ICD-10-CM | POA: Diagnosis not present

## 2017-04-22 DIAGNOSIS — E1165 Type 2 diabetes mellitus with hyperglycemia: Secondary | ICD-10-CM

## 2017-04-22 DIAGNOSIS — Z01812 Encounter for preprocedural laboratory examination: Secondary | ICD-10-CM | POA: Diagnosis not present

## 2017-04-22 DIAGNOSIS — Z01810 Encounter for preprocedural cardiovascular examination: Secondary | ICD-10-CM | POA: Insufficient documentation

## 2017-04-22 DIAGNOSIS — Z794 Long term (current) use of insulin: Secondary | ICD-10-CM | POA: Diagnosis not present

## 2017-04-22 DIAGNOSIS — H538 Other visual disturbances: Secondary | ICD-10-CM

## 2017-04-22 DIAGNOSIS — R9431 Abnormal electrocardiogram [ECG] [EKG]: Secondary | ICD-10-CM | POA: Insufficient documentation

## 2017-04-22 DIAGNOSIS — IMO0001 Reserved for inherently not codable concepts without codable children: Secondary | ICD-10-CM

## 2017-04-22 LAB — BASIC METABOLIC PANEL
BUN: 11 mg/dL (ref 6–23)
CHLORIDE: 99 meq/L (ref 96–112)
CO2: 31 mEq/L (ref 19–32)
CREATININE: 0.77 mg/dL (ref 0.40–1.20)
Calcium: 9.7 mg/dL (ref 8.4–10.5)
GFR: 100.94 mL/min (ref >60.00)
Glucose, Bld: 216 mg/dL — ABNORMAL HIGH (ref 70–99)
Potassium: 3.6 mEq/L (ref 3.5–5.1)
Sodium: 137 mEq/L (ref 135–145)

## 2017-04-22 LAB — COMPREHENSIVE METABOLIC PANEL
ALBUMIN: 3.7 g/dL (ref 3.5–5.0)
ALT: 14 U/L (ref 14–54)
AST: 16 U/L (ref 15–41)
Alkaline Phosphatase: 76 U/L (ref 38–126)
Anion gap: 11 (ref 5–15)
BILIRUBIN TOTAL: 0.5 mg/dL (ref 0.3–1.2)
BUN: 9 mg/dL (ref 6–20)
CHLORIDE: 101 mmol/L (ref 101–111)
CO2: 27 mmol/L (ref 22–32)
CREATININE: 0.76 mg/dL (ref 0.44–1.00)
Calcium: 9.3 mg/dL (ref 8.9–10.3)
GFR calc Af Amer: >60 mL/min (ref >60)
GLUCOSE: 202 mg/dL — AB (ref 65–99)
Potassium: 3.6 mmol/L (ref 3.5–5.1)
Sodium: 139 mmol/L (ref 135–145)
TOTAL PROTEIN: 7.9 g/dL (ref 6.5–8.1)

## 2017-04-22 LAB — CBC
HEMATOCRIT: 39.4 % (ref 36.0–46.0)
HEMOGLOBIN: 12.5 g/dL (ref 12.0–15.0)
MCH: 27.9 pg (ref 26.0–34.0)
MCHC: 31.7 g/dL (ref 30.0–36.0)
MCV: 87.9 fL (ref 78.0–100.0)
Platelets: 225 10*3/uL (ref 150–400)
RBC: 4.48 MIL/uL (ref 3.87–5.11)
RDW: 13.8 % (ref 11.5–15.5)
WBC: 6.9 10*3/uL (ref 4.0–10.5)

## 2017-04-22 LAB — HEMOGLOBIN A1C: Hgb A1c MFr Bld: 12.4 % — ABNORMAL HIGH (ref 4.6–6.5)

## 2017-04-22 MED ORDER — GLIMEPIRIDE 4 MG PO TABS: 6.0000 mg | ORAL_TABLET | Freq: Every day | ORAL | 3 refills | Status: DC

## 2017-04-22 MED ORDER — BASAGLAR KWIKPEN 100 UNIT/ML ~~LOC~~ SOPN: PEN_INJECTOR | SUBCUTANEOUS | 6 refills | Status: DC

## 2017-04-22 NOTE — Patient Instructions (Addendum)
Increase your glimepiride to 1.5 pills (6 mg total) a day.  After 3-4 days you can continue to titrate up your insulin glargine by 2 units every 2 days as long as your fasting sugar is over 150   I will be in touch with your labs asap  Certainly if you wish you can consult with a disability lawyer, but I am hopeful that we will be able to get your vision back up to speed!

## 2017-04-22 NOTE — Progress Notes (Signed)
EKG reviewed by Dr. Royce Macadamia, anesthesia consult done, will proceed with surgery as scheduled.

## 2017-04-22 NOTE — Anesthesia Preprocedure Evaluation (Addendum)
Anesthesia Evaluation  Patient identified by MRN, date of birth, ID band Patient awake    Reviewed: Allergy & Precautions, NPO status , Patient's Chart, lab work & pertinent test results, reviewed documented beta blocker date and time   History of Anesthesia Complications (+) history of anesthetic complications  Airway Mallampati: III  TM Distance: >3 FB Neck ROM: Full    Dental no notable dental hx. (+) Teeth Intact, Dental Advisory Given   Pulmonary asthma , sleep apnea , former smoker,    Pulmonary exam normal breath sounds clear to auscultation       Cardiovascular hypertension, Pt. on medications and Pt. on home beta blockers Normal cardiovascular exam Rhythm:Regular Rate:Normal     Neuro/Psych  Headaches, PSYCHIATRIC DISORDERS Anxiety Depression    GI/Hepatic negative GI ROS, Neg liver ROS,   Endo/Other  diabetes, Poorly Controlled, Type 2, Insulin DependentMorbid obesity  Renal/GU negative Renal ROS  negative genitourinary   Musculoskeletal  (+) Arthritis , Osteoarthritis,    Abdominal (+) + obese,   Peds  Hematology   Anesthesia Other Findings   Reproductive/Obstetrics PMB                            Anesthesia Physical Anesthesia Plan  ASA: III  Anesthesia Plan: General   Post-op Pain Management:    Induction: Intravenous  PONV Risk Score and Plan: 4 or greater and Midazolam, Ondansetron, Treatment may vary due to age or medical condition and Scopolamine patch - Pre-op  Airway Management Planned: Oral ETT  Additional Equipment: None  Intra-op Plan:   Post-operative Plan: Extubation in OR  Informed Consent:   Dental advisory given  Plan Discussed with: CRNA and Anesthesiologist  Anesthesia Plan Comments:       Anesthesia Quick Evaluation

## 2017-04-23 ENCOUNTER — Encounter: Payer: Self-pay | Admitting: Family Medicine

## 2017-04-27 ENCOUNTER — Telehealth: Payer: Self-pay

## 2017-04-27 NOTE — Telephone Encounter (Signed)
Patient scheduled for [facility] on 04-29-17 with Dr. Harolyn Rutherford. Kathrene Alu RN

## 2017-04-27 NOTE — Telephone Encounter (Signed)
-----   Message from Truett Mainland, DO sent at 03/16/2017 11:47 AM EST ----- Results reviewed with patient. Please schedule appt with Dr Ihor Dow for consult for hysteroscopy with D&C.

## 2017-04-29 ENCOUNTER — Encounter (HOSPITAL_BASED_OUTPATIENT_CLINIC_OR_DEPARTMENT_OTHER)
Admission: RE | Disposition: A | Discharge status: Home / Self Care | Payer: Self-pay | Source: Ambulatory Visit | Attending: Obstetrics & Gynecology

## 2017-04-29 ENCOUNTER — Encounter (HOSPITAL_BASED_OUTPATIENT_CLINIC_OR_DEPARTMENT_OTHER): Payer: Self-pay | Admitting: *Deleted

## 2017-04-29 ENCOUNTER — Ambulatory Visit (HOSPITAL_BASED_OUTPATIENT_CLINIC_OR_DEPARTMENT_OTHER)
Admission: RE | Admit: 2017-04-29 | Discharge: 2017-04-29 | Disposition: A | Discharge status: Home / Self Care | Payer: 59 | Source: Ambulatory Visit | Attending: Obstetrics & Gynecology | Admitting: Obstetrics & Gynecology

## 2017-04-29 ENCOUNTER — Other Ambulatory Visit: Payer: Self-pay

## 2017-04-29 ENCOUNTER — Ambulatory Visit (HOSPITAL_COMMUNITY)
Admission: RE | Admit: 2017-04-29 | Discharge: 2017-04-29 | Disposition: A | Discharge status: Home / Self Care | Payer: 59 | Source: Ambulatory Visit | Attending: Obstetrics & Gynecology | Admitting: Obstetrics & Gynecology

## 2017-04-29 ENCOUNTER — Ambulatory Visit (HOSPITAL_BASED_OUTPATIENT_CLINIC_OR_DEPARTMENT_OTHER): Payer: 59 | Admitting: Certified Registered"

## 2017-04-29 ENCOUNTER — Ambulatory Visit (HOSPITAL_BASED_OUTPATIENT_CLINIC_OR_DEPARTMENT_OTHER): Payer: 59 | Admitting: Anesthesiology

## 2017-04-29 DIAGNOSIS — I10 Essential (primary) hypertension: Secondary | ICD-10-CM | POA: Insufficient documentation

## 2017-04-29 DIAGNOSIS — N950 Postmenopausal bleeding: Secondary | ICD-10-CM | POA: Insufficient documentation

## 2017-04-29 DIAGNOSIS — Z6841 Body Mass Index (BMI) 40.0 and over, adult: Secondary | ICD-10-CM | POA: Insufficient documentation

## 2017-04-29 DIAGNOSIS — Z79899 Other long term (current) drug therapy: Secondary | ICD-10-CM | POA: Diagnosis not present

## 2017-04-29 DIAGNOSIS — F419 Anxiety disorder, unspecified: Secondary | ICD-10-CM | POA: Diagnosis not present

## 2017-04-29 DIAGNOSIS — E119 Type 2 diabetes mellitus without complications: Secondary | ICD-10-CM | POA: Diagnosis not present

## 2017-04-29 DIAGNOSIS — G4730 Sleep apnea, unspecified: Secondary | ICD-10-CM | POA: Diagnosis not present

## 2017-04-29 DIAGNOSIS — Z794 Long term (current) use of insulin: Secondary | ICD-10-CM | POA: Diagnosis not present

## 2017-04-29 DIAGNOSIS — Z87891 Personal history of nicotine dependence: Secondary | ICD-10-CM | POA: Insufficient documentation

## 2017-04-29 DIAGNOSIS — F329 Major depressive disorder, single episode, unspecified: Secondary | ICD-10-CM | POA: Insufficient documentation

## 2017-04-29 HISTORY — DX: Unspecified asthma, uncomplicated: J45.909

## 2017-04-29 HISTORY — PX: HYSTEROSCOPY WITH D & C: SHX1775

## 2017-04-29 HISTORY — DX: Failed or difficult intubation, initial encounter: T88.4XXA

## 2017-04-29 LAB — GLUCOSE, CAPILLARY
GLUCOSE-CAPILLARY: 235 mg/dL — AB (ref 65–99)
GLUCOSE-CAPILLARY: 200 mg/dL — AB (ref 65–99)
GLUCOSE-CAPILLARY: 220 mg/dL — AB (ref 65–99)

## 2017-04-29 SURGERY — DILATATION AND CURETTAGE /HYSTEROSCOPY
Anesthesia: General | Site: Vagina

## 2017-04-29 MED ORDER — LIDOCAINE HCL (CARDIAC) PF 100 MG/5ML IV SOSY
PREFILLED_SYRINGE | INTRAVENOUS | Status: DC | PRN
  Administered 2017-04-29: 100 mg via INTRAVENOUS

## 2017-04-29 MED ORDER — FENTANYL CITRATE (PF) 100 MCG/2ML IJ SOLN
INTRAMUSCULAR | Status: AC
  Filled 2017-04-29: qty 2

## 2017-04-29 MED ORDER — SUGAMMADEX SODIUM 200 MG/2ML IV SOLN
INTRAVENOUS | Status: DC | PRN
  Administered 2017-04-29: 200 mg via INTRAVENOUS

## 2017-04-29 MED ORDER — BUPIVACAINE HCL 0.5 % IJ SOLN
INTRAMUSCULAR | Status: DC | PRN
  Administered 2017-04-29: 30 mL

## 2017-04-29 MED ORDER — OXYCODONE HCL 5 MG PO TABS
5.0000 mg | ORAL_TABLET | Freq: Once | ORAL | Status: AC | PRN
  Administered 2017-04-29: 5 mg via ORAL

## 2017-04-29 MED ORDER — PHENYLEPHRINE HCL 10 MG/ML IJ SOLN
INTRAMUSCULAR | Status: DC | PRN
  Administered 2017-04-29 (×3): 120 ug via INTRAVENOUS

## 2017-04-29 MED ORDER — EPHEDRINE SULFATE 50 MG/ML IJ SOLN
INTRAMUSCULAR | Status: DC | PRN
  Administered 2017-04-29: 10 mg via INTRAVENOUS
  Administered 2017-04-29: 5 mg via INTRAVENOUS

## 2017-04-29 MED ORDER — IBUPROFEN 600 MG PO TABS: 600.0000 mg | ORAL_TABLET | Freq: Three times a day (TID) | ORAL | 2 refills | Status: DC | PRN

## 2017-04-29 MED ORDER — OXYCODONE HCL 5 MG PO TABS
ORAL_TABLET | ORAL | Status: AC
  Filled 2017-04-29: qty 1

## 2017-04-29 MED ORDER — ONDANSETRON HCL 4 MG/2ML IJ SOLN
INTRAMUSCULAR | Status: DC | PRN
  Administered 2017-04-29: 4 mg via INTRAVENOUS

## 2017-04-29 MED ORDER — ONDANSETRON HCL 4 MG/2ML IJ SOLN
INTRAMUSCULAR | Status: AC
  Filled 2017-04-29: qty 2

## 2017-04-29 MED ORDER — SUCCINYLCHOLINE CHLORIDE 20 MG/ML IJ SOLN
INTRAMUSCULAR | Status: DC | PRN
  Administered 2017-04-29: 160 mg via INTRAVENOUS

## 2017-04-29 MED ORDER — LACTATED RINGERS IV SOLN: INTRAVENOUS | Status: DC

## 2017-04-29 MED ORDER — DEXAMETHASONE SODIUM PHOSPHATE 10 MG/ML IJ SOLN
INTRAMUSCULAR | Status: AC
  Filled 2017-04-29: qty 1

## 2017-04-29 MED ORDER — PROMETHAZINE HCL 25 MG/ML IJ SOLN: 6.2500 mg | INTRAMUSCULAR | Status: DC | PRN

## 2017-04-29 MED ORDER — INSULIN ASPART 100 UNIT/ML ~~LOC~~ SOLN
5.0000 [IU] | Freq: Once | SUBCUTANEOUS | Status: AC
  Administered 2017-04-29: 5 [IU] via SUBCUTANEOUS

## 2017-04-29 MED ORDER — PROPOFOL 10 MG/ML IV BOLUS
INTRAVENOUS | Status: AC
  Filled 2017-04-29: qty 40

## 2017-04-29 MED ORDER — MIDAZOLAM HCL 2 MG/2ML IJ SOLN
1.0000 mg | INTRAMUSCULAR | Status: DC | PRN
  Administered 2017-04-29: 2 mg via INTRAVENOUS

## 2017-04-29 MED ORDER — ROCURONIUM BROMIDE 100 MG/10ML IV SOLN
INTRAVENOUS | Status: DC | PRN
  Administered 2017-04-29: 40 mg via INTRAVENOUS

## 2017-04-29 MED ORDER — GLYCOPYRROLATE 0.2 MG/ML IJ SOLN
INTRAMUSCULAR | Status: DC | PRN
  Administered 2017-04-29: 0.2 mg via INTRAVENOUS
  Administered 2017-04-29: 0.6 mg via INTRAVENOUS

## 2017-04-29 MED ORDER — LIDOCAINE HCL (CARDIAC) 20 MG/ML IV SOLN
INTRAVENOUS | Status: AC
  Filled 2017-04-29: qty 5

## 2017-04-29 MED ORDER — ALBUTEROL SULFATE HFA 108 (90 BASE) MCG/ACT IN AERS
INHALATION_SPRAY | RESPIRATORY_TRACT | Status: DC | PRN
  Administered 2017-04-29: 4 via RESPIRATORY_TRACT

## 2017-04-29 MED ORDER — ROCURONIUM BROMIDE 10 MG/ML (PF) SYRINGE
PREFILLED_SYRINGE | INTRAVENOUS | Status: AC
  Filled 2017-04-29: qty 5

## 2017-04-29 MED ORDER — DOCUSATE SODIUM 100 MG PO CAPS: 100.0000 mg | ORAL_CAPSULE | Freq: Two times a day (BID) | ORAL | 2 refills | Status: AC | PRN

## 2017-04-29 MED ORDER — SCOPOLAMINE 1 MG/3DAYS TD PT72: 1.0000 | MEDICATED_PATCH | Freq: Once | TRANSDERMAL | Status: DC | PRN

## 2017-04-29 MED ORDER — FENTANYL CITRATE (PF) 100 MCG/2ML IJ SOLN: 25.0000 ug | INTRAMUSCULAR | Status: DC | PRN

## 2017-04-29 MED ORDER — NEOSTIGMINE METHYLSULFATE 10 MG/10ML IV SOLN
INTRAVENOUS | Status: DC | PRN
  Administered 2017-04-29: 4 mg via INTRAVENOUS

## 2017-04-29 MED ORDER — OXYCODONE HCL 5 MG/5ML PO SOLN: 5.0000 mg | Freq: Once | ORAL | Status: AC | PRN

## 2017-04-29 MED ORDER — OXYCODONE-ACETAMINOPHEN 5-325 MG PO TABS: 1.0000 | ORAL_TABLET | ORAL | 0 refills | Status: DC | PRN

## 2017-04-29 MED ORDER — LACTATED RINGERS IV SOLN
INTRAVENOUS | Status: DC
  Administered 2017-04-29 (×2): via INTRAVENOUS

## 2017-04-29 MED ORDER — FENTANYL CITRATE (PF) 100 MCG/2ML IJ SOLN
50.0000 ug | INTRAMUSCULAR | Status: DC | PRN
  Administered 2017-04-29: 100 ug via INTRAVENOUS

## 2017-04-29 MED ORDER — MIDAZOLAM HCL 2 MG/2ML IJ SOLN
INTRAMUSCULAR | Status: AC
  Filled 2017-04-29: qty 2

## 2017-04-29 SURGICAL SUPPLY — 18 items
BIPOLAR CUTTING LOOP 21FR (ELECTRODE)
BRIEF STRETCH FOR OB PAD XXL (UNDERPADS AND DIAPERS) ×2 IMPLANT
CANISTER AQUILEX FLUID KIT (CANNISTER) ×2 IMPLANT
CATH ROBINSON RED A/P 16FR (CATHETERS) ×1 IMPLANT
CLOTH BEACON ORANGE TIMEOUT ST (SAFETY) ×2 IMPLANT
CONTAINER PREFILL 10% NBF 60ML (FORM) ×4 IMPLANT
DRAPE HALF SHEET 40X57 (DRAPES) ×1 IMPLANT
GLOVE BIOGEL PI IND STRL 7.0 (GLOVE) ×1 IMPLANT
GLOVE BIOGEL PI INDICATOR 7.0 (GLOVE) ×1
GLOVE ECLIPSE 7.0 STRL STRAW (GLOVE) ×2 IMPLANT
GOWN STRL REUS W/TWL LRG LVL3 (GOWN DISPOSABLE) ×4 IMPLANT
LOOP CUTTING BIPOLAR 21FR (ELECTRODE) IMPLANT
PACK VAGINAL MINOR WOMEN LF (CUSTOM PROCEDURE TRAY) ×2 IMPLANT
PAD OB MATERNITY 4.3X12.25 (PERSONAL CARE ITEMS) ×2 IMPLANT
PAD PREP 24X48 CUFFED NSTRL (MISCELLANEOUS) ×2 IMPLANT
TOWEL OR 17X24 6PK STRL BLUE (TOWEL DISPOSABLE) ×4 IMPLANT
TUBING AQUILEX INFLOW (TUBING) ×2 IMPLANT
TUBING AQUILEX OUTFLOW (TUBING) ×2 IMPLANT

## 2017-04-29 NOTE — Op Note (Signed)
PREOPERATIVE DIAGNOSIS: Postmenopausal bleeding, endometrial nodule, inadequate office endometrial sampling, history of endometrial ablation POSTOPERATIVE DIAGNOSES: The same PROCEDURE: Hysteroscopy, Dilation and Curettage under Ultrasound Guidance SURGEON:  Dr. Verita Schneiders  INDICATIONS: 52 y.o. G2P1011  here for scheduled surgery for the aforementioned diagnoses.   Risks of surgery were discussed with the patient including but not limited to: bleeding which may require transfusion; infection which may require antibiotics; injury to uterus or surrounding organs; intrauterine scarring which may impair future fertility; need for additional procedures including laparotomy or laparoscopy; and other postoperative/anesthesia complications. Written informed consent was obtained.    FINDINGS:  A 8 week size uterus. Dilation and curettage done under ultrasound guidance.  Distorted/ratty endometium noted on hysteroscopy (was done after curettage). No discrete nodules were seen. Unable to visualize ostia.  ANESTHESIA:   General, paracervical block with 30 ml of 0.5% Marcaine INTRAVENOUS FLUIDS:  950 ml of LR FLUID DEFICITS:  65 ml of NS ESTIMATED BLOOD LOSS:  25 ml SPECIMENS: Endometrial curettings sent to pathology COMPLICATIONS:  None immediate.  PROCEDURE DETAILS:  The patient was then taken to the operating room where general anesthesia was administered and was found to be adequate.  After an adequate timeout was performed, she was placed in the dorsal lithotomy position and examined; then prepped and draped in the sterile manner.   A speculum was then placed in the patient's vagina and a single tooth tenaculum was applied to the anterior lip of the cervix.   A paracervical block using 30 ml of 0.5% Marcaine was administered.  Under transabdominal ultrasound guidance, the uterus was sounded to 8 cm and the cervix was dilated manually with metal dilators to accommodate the smallest serrated curette.  Detailed curettage was then performed under ultrasound guidance; a moderate amount of tissue was obtained for pathology analysis.  The 3 mm diagnostic hysteroscope was then inserted under direct visualization using NS as a suspension medium.  The uterine cavity was carefully examined with the findings as noted above.   After further careful visualization of the uterine cavity, the hysteroscope was removed under direct visualization.  The tenaculum was removed from the anterior lip of the cervix and the vaginal speculum was removed after noting good hemostasis.  The patient tolerated the procedure well and was taken to the recovery area awake, extubated and in stable condition.  The patient will be discharged to home as per PACU criteria.  Routine postoperative instructions given.  She was prescribed Percocet, Ibuprofen and Colace.  She will follow up in the clinic on 05/13/17  for postoperative evaluation.   Verita Schneiders, MD, [place] for [employer], [place]

## 2017-04-29 NOTE — Telephone Encounter (Signed)
Patient dropped off STD paperwork for [facility], Attending Physician's Statement, for [employer] [place]. Unsure if this is new, as it has RTW status & Functional Limitations; forwarded to provider/SLS 04/17

## 2017-04-29 NOTE — Anesthesia Postprocedure Evaluation (Signed)
Anesthesia Post Note  Patient: Nicole Hubbard  Procedure(s) Performed: DILATATION AND CURETTAGE /HYSTEROSCOPY WITH ULRASOUND GUIDANCE (N/A Vagina )     Patient location during evaluation: PACU Anesthesia Type: General Level of consciousness: awake and alert Pain management: pain level controlled Vital Signs Assessment: post-procedure vital signs reviewed and stable Respiratory status: spontaneous breathing and nonlabored ventilation Cardiovascular status: blood pressure returned to baseline and stable Postop Assessment: no apparent nausea or vomiting Anesthetic complications: no Comments: Patient with SaO2 88-92 on room air, consistent with preop SaO2. Patient instructed to follow up with her PCP to discuss her low O2 saturations.    Last Vitals:  Vitals:   04/29/17 1215 04/29/17 1230  BP: 121/85 112/77  Pulse: (!) 102 (!) 101  Resp: (!) 31 (!) 24  Temp:    SpO2: 92% 90%    Last Pain:  Vitals:   04/29/17 1230  TempSrc:   PainSc: [place]

## 2017-04-29 NOTE — Transfer of Care (Signed)
Immediate Anesthesia Transfer of Care Note  Patient: Nicole Hubbard  Procedure(s) Performed: DILATATION AND CURETTAGE /HYSTEROSCOPY WITH ULRASOUND GUIDANCE (N/A Vagina )  Patient Location: PACU  Anesthesia Type:General  Level of Consciousness: awake, alert  and oriented  Airway & Oxygen Therapy: Patient Spontanous Breathing and Patient connected to face mask oxygen  Post-op Assessment: Report given to RN and Post -op Vital signs reviewed and unstable, Anesthesiologist notified  Post vital signs: Reviewed and stable  Last Vitals:  Vitals Value Taken Time  BP 142/93 04/29/2017 11:45 AM  Temp    Pulse 109 04/29/2017 11:51 AM  Resp 17 04/29/2017 11:51 AM  SpO2 96 % 04/29/2017 11:51 AM  Vitals shown include unvalidated device data.  Last Pain:  Vitals:   04/29/17 1002  TempSrc: Oral  PainSc: 0-No pain      Patients Stated Pain Goal: 0 (78/58/85 0277)  Complications: No apparent anesthesia complications

## 2017-04-29 NOTE — Interval H&P Note (Signed)
History and Physical Interval Note 04/29/2017 9:44 AM  Nicole Hubbard  has presented today for surgery, with the diagnosis of Postmenopausal bleeding and endometrial nodule, s/p inadequate endometrial biopsy on 03/09/17. Patient also had an endometrial ablation.  03/09/2017 Endometrium, biopsy - FRAGMENTS OF BENIGN ENDOCERVICAL GLANDULAR TISSUE. - NO ENDOMETRIAL TISSUE IDENTIFIED. - NO MALIGNANCY IDENTIFIED.  03/09/2017  TRANSABDOMINAL AND TRANSVAGINAL ULTRASOUND OF PELVIS  CLINICAL DATA:  Postmenopausal bleeding for 2 years, prior endometrial ablation  FINDINGS: Uterus Measurements: 8.6 x 3.8 x 4.1 cm. Mildly heterogeneous myometrial echogenicity. Complicated nabothian cysts at cervix. Subtle hypoechoic nodule at anterior LEFT uterus 10 x 7 x 7 mm. No additional uterine masses. Endometrium Thickness: 11 mm thick. Suspected hypoechoic nodule within endometrium at upper uterine segment 12 x 7 x 10 mm. No endometrial fluid. Right ovary Measurements: 1.6 x 2.9 x 2.3 cm.  Normal morphology without mass Left ovary Measurements: 2.4 x 1.1 x 2.1 cm.  Normal morphology without mass Other findings Trace free pelvic fluid.  No adnexal masses.  IMPRESSION: Suspected small submucosal leiomyoma at the upper LEFT uterus 10 mm diameter. Abnormal thickening of the endometrial complex 11 mm thick with potential 12 x 7 x 10 mm endometrial nodule at upper uterine segment; in the setting of post-menopausal bleeding, endometrial sampling is indicated to exclude carcinoma. If results are benign, sonohysterogram should be considered for focal lesion work-up  The various methods of treatment have been discussed with the patient and family.  Re-discussed risks and benefits of procedure in detail.  After consideration of risks, benefits and other options for treatment, the patient has consented to  [place] as a surgical intervention.  The patient's history has been reviewed,  patient examined, no change in status, stable for surgery.  I have reviewed the patient's chart and labs.  Questions were answered to the patient's satisfaction.     Verita Schneiders, MD, [place] for [employer], [place], [place]

## 2017-04-29 NOTE — Discharge Instructions (Signed)
Hysteroscopy, Care After Refer to this sheet in the next few weeks. These instructions provide you with information on caring for yourself after your procedure. Your health care provider may also give you more specific instructions. Your treatment has been planned according to current medical practices, but problems sometimes occur. Call your health care provider if you have any problems or questions after your procedure. What can I expect after the procedure? After your procedure, it is typical to have the following:  You may have some cramping. This normally lasts for a couple days.  You may have bleeding. This can vary from light spotting for a few days to menstrual-like bleeding for 3-7 days.  Follow these instructions at home:  Rest for the first 1-2 days after the procedure.  Only take over-the-counter or prescription medicines as directed by your health care provider. Do not take aspirin. It can increase the chances of bleeding.  Take showers instead of baths for 2 weeks or as directed by your health care provider.  Do not drive for 24 hours or as directed.  Do not drink alcohol while taking pain medicine.  Do not use tampons, douche, or have sexual intercourse for 2 weeks or until your health care provider says it is okay.  Take your temperature twice a day for 4-5 days. Write it down each time.  Follow your health care provider's advice about diet, exercise, and lifting.  If you develop constipation, you may: ? Take a mild laxative if your health care provider approves. ? Add bran foods to your diet. ? Drink enough fluids to keep your urine clear or pale yellow.  Try to have someone with you or available to you for the first 24-48 hours, especially if you were given a general anesthetic.  Follow up with your health care provider as directed. Contact a health care provider if:  You feel dizzy or lightheaded.  You feel sick to your stomach (nauseous).  You have  abnormal vaginal discharge.  You have a rash.  You have pain that is not controlled with medicine. Get help right away if:  You have bleeding that is heavier than a normal menstrual period.  You have a fever.  You have increasing cramps or pain, not controlled with medicine.  You have new belly (abdominal) pain.  You pass out.  You have pain in the tops of your shoulders (shoulder strap areas).  You have shortness of breath. This information is not intended to replace advice given to you by your health care provider. Make sure you discuss any questions you have with your health care provider. Document Released: 10/20/2012 Document Revised: 06/07/2015 Document Reviewed: 07/29/2012 Elsevier Interactive Patient Education  2017 [place] [facility] Home Care Instructions  Activity: Get plenty of rest for the remainder of the day. A responsible individual must stay with you for 24 hours following the procedure.  For the next 24 hours, DO NOT: -Drive a car -Paediatric nurse -Drink alcoholic beverages -Take any medication unless instructed by your physician -Make any legal decisions or sign important papers.  Meals: Start with liquid foods such as gelatin or soup. Progress to regular foods as tolerated. Avoid greasy, spicy, heavy foods. If nausea and/or vomiting occur, drink only clear liquids until the nausea and/or vomiting subsides. Call your physician if vomiting continues.  Special Instructions/Symptoms: Your throat may feel dry or sore from the anesthesia or the breathing tube placed in your throat during surgery. If this causes discomfort, gargle  with warm salt water. The discomfort should disappear within 24 hours. ° °If you had a scopolamine patch placed behind your ear for the management of post- operative nausea and/or vomiting: ° °1. The medication in the patch is effective for 72 hours, after which it should be removed.  Wrap patch in a tissue and discard in  the trash. Wash hands thoroughly with soap and water. °2. You may remove the patch earlier than 72 hours if you experience unpleasant side effects which may include dry mouth, dizziness or visual disturbances. °3. Avoid touching the patch. Wash your hands with soap and water after contact with the patch. °  ° ° °

## 2017-04-29 NOTE — Anesthesia Procedure Notes (Addendum)
Procedure Name: Intubation Performed by: Verita Lamb, CRNA Pre-anesthesia Checklist: Patient identified, Emergency Drugs available, Suction available, Patient being monitored and Timeout performed Patient Re-evaluated:Patient Re-evaluated prior to induction Oxygen Delivery Method: Circle system utilized Preoxygenation: Pre-oxygenation with 100% oxygen Induction Type: IV induction Ventilation: Two handed mask ventilation required and Oral airway inserted - appropriate to patient size Laryngoscope Size: Mac, 3, Miller, 2 and Glidescope Grade View: Grade III Tube type: Oral Tube size: 7.0 mm Number of attempts: 4 Airway Equipment and Method: Stylet Placement Confirmation: ETT inserted through vocal cords under direct vision,  positive ETCO2,  CO2 detector and breath sounds checked- equal and bilateral Secured at: 22 cm Tube secured with: Tape Dental Injury: Teeth and Oropharynx as per pre-operative assessment  Difficulty Due To: Difficult Airway- due to large tongue, Difficult Airway- due to anterior larynx and Difficulty was anticipated Future Recommendations: Recommend- induction with short-acting agent, and alternative techniques readily available Comments: Pt had a smooth iv induction, moderately difficult mask required oral airway.  Unable to mask without oral airway.  We used rsi succ.  We attempted mac 3, grade IV view, then anesthesiologist attempted miller 2 achieved grade 3 view. Patient was mask ventilated prior to attempt with glidescope.  With firm pressure we could achieve a grade II view with the glidesope, but required two looks due to redundant tissue and anterior larynx.  Her jaw also was very stiff making a jaw lift difficult. Would recommend glidescope for future intubations. Difficult airway.  Intubated by anesthesiologist

## 2017-04-30 ENCOUNTER — Encounter (HOSPITAL_BASED_OUTPATIENT_CLINIC_OR_DEPARTMENT_OTHER): Payer: Self-pay | Admitting: Obstetrics & Gynecology

## 2017-04-30 NOTE — Addendum Note (Signed)
Addendum  created 04/30/17 1010 by Tawni Millers, CRNA   Charge Capture section accepted

## 2017-05-02 ENCOUNTER — Other Ambulatory Visit: Payer: Self-pay | Admitting: Family Medicine

## 2017-05-02 DIAGNOSIS — M62838 Other muscle spasm: Secondary | ICD-10-CM

## 2017-05-04 ENCOUNTER — Encounter: Payer: Self-pay | Admitting: Family Medicine

## 2017-05-04 ENCOUNTER — Telehealth: Payer: Self-pay

## 2017-05-04 NOTE — Telephone Encounter (Signed)
-----   Message from Osborne Oman, MD sent at 05/04/2017 10:03 AM EDT ----- 04/29/17 Endometrium, curettage - BENIGN SQUAMOUS AND ENDOCERVICAL MUCOSA. - NO DEFINITIVE ENDOMETRIAL TISSUE. Gross: Received in formalin are tan, hemorrhagic soft tissue fragments that are entirely submitted. Volume: 2.1 x 1.6 x 0.4 cm. Number of blocks: 2  Endometrium was curettaged under ultrasound guidance. The tissue obtained must have been just scar tissue from the ablation, since no endometrial tissue was seen even after extensive curettage.  If bleeding continues, may need definitive surgical management with hysterectomy given inability to do endometrial sampling s/p ablation.  Please call to inform patient of results and recommendations.  Nicole Schneiders, MD, [place] for [employer], [place]

## 2017-05-04 NOTE — Telephone Encounter (Signed)
Left message for patient to return call to office. Jennifer Howard  RN 

## 2017-05-04 NOTE — Telephone Encounter (Signed)
Patient returned call to office. Made aware of pathology findings and need to notified us if she has continued bleeding. Patient states she is still bleeding from procedure.  Has follow up for post op 05-13-17 with Dr. Nehemiah Settle. Kathrene Alu RN

## 2017-05-04 NOTE — Telephone Encounter (Signed)
Received refill request for cyclobenzaprine (FLEXERIL) 10 MG tablet. Last office visit 04/22/17 and last refill 03/30/17.

## 2017-05-13 ENCOUNTER — Encounter: Payer: Self-pay | Admitting: Family Medicine

## 2017-05-13 ENCOUNTER — Ambulatory Visit (INDEPENDENT_AMBULATORY_CARE_PROVIDER_SITE_OTHER): Payer: 59 | Admitting: Family Medicine

## 2017-05-13 VITALS — BP 122/89 | HR 108 | Ht 68.0 in | Wt 304.0 lb

## 2017-05-13 DIAGNOSIS — N950 Postmenopausal bleeding: Secondary | ICD-10-CM

## 2017-05-13 DIAGNOSIS — Z9889 Other specified postprocedural states: Secondary | ICD-10-CM

## 2017-05-13 DIAGNOSIS — E119 Type 2 diabetes mellitus without complications: Secondary | ICD-10-CM

## 2017-05-13 NOTE — Progress Notes (Signed)
Patient still having some light bleeding. Patient is considering hysterectomy. Kathrene Alu RN

## 2017-05-13 NOTE — Progress Notes (Signed)
   Subjective:    Patient ID: Nicole Hubbard, female    DOB: 06/15/1965, 52 y.o.   MRN: [id number]  HPI  Seen for postop visit after hysteroscopy/D&C. Pathology showed endocervical cells. Patient continues to have some bleeding. She would like hysterectomy. Denies pain, discharge.  Review of Systems     Objective:   Physical Exam  Constitutional: She appears well-developed and well-nourished.  HENT:  Head: Normocephalic and atraumatic.  Cardiovascular: Normal rate.  Pulmonary/Chest: Effort normal.  Abdominal: Soft. She exhibits no mass. There is no tenderness. There is no guarding.      Assessment & Plan:  1. Postmenopausal bleeding Will arrange consult with gyn surgeon. F/u prn in the interim.  2. Type 2 diabetes mellitus without complication, without long-term current use of insulin ([place])  3. Morbid obesity ([place])

## 2017-05-18 ENCOUNTER — Encounter: Payer: Self-pay | Admitting: Family Medicine

## 2017-05-19 ENCOUNTER — Encounter: Payer: Self-pay | Admitting: Family Medicine

## 2017-05-30 NOTE — Progress Notes (Addendum)
[place] at [facility] [address], [place], [place] [address] [phone]  Date:  06/01/2017   Name:  Nicole Hubbard   DOB:  09-18-1965   MRN:  [id number]  PCP:  Darreld Mclean, MD    Chief Complaint: Diabetes (follow up, weight gain, needing refills on medication); Hypertension; and Depression   History of Present Illness:  Nicole Hubbard is a 52 y.o. very pleasant female patient who presents with the following:  Following up on her DM today History of HTN, DM, obesity, depression Recently her A1c went dramatically higher and we have been working on getting her back under control   Currently she is using Basiglar, glimeperide No hypoglycemia noted  Foot exam is due  She notes that she is overall feeling much better and having fewer sx of her DM.  Her only issue now is really her vision She is seeing her GYN at the end of this month.  She would like to get a hyst but this is not yet on the books  She has not gone back to work as her RTW date is the 23rd- however as her operation is coming up they plan to keep her out until this is done  Her vision is still not great- she thinks that her rx may need to be changed and she plans to see her eye care professional asap.  We had thought that her sx were due to hyperglycemia alone but this may not be the whole problem She a very hard time seeing her computer and also with mid to distance vision  She does wear bifocals  Her glucose is running 150- 200; she generally does a fasting am check   We increased her glimepiride to 6 mg not long ago basiglar - 40 units  She uses flexeril on occasion for her back  We tried farxiga in the past but she got UTI She is checking her glucose once a day now She would really like to lose weight and is determined to do so   Wt Readings from Last 3 Encounters:  06/01/17 (!) 317 lb (143.8 kg)  05/13/17 (!) 304 lb (137.9 kg)  04/29/17 (!) 310 lb  (140.6 kg)    Lab Results  Component Value Date   HGBA1C 10.6 (H) 06/01/2017   She also brings up the mass in her right upper quadrant that we tried to eval with [place] back in February. However it was not visualized. Seems to be subcue in position  Not painful She is not sure if this is getting a bit bigger   Patient Active Problem List   Diagnosis Date Noted  . Postmenopausal bleeding 04/29/2017  . Sleep disorder 05/05/2016  . Chronic insomnia 05/05/2016  . DDD (degenerative disc disease), lumbar 03/19/2015  . MDD (major depressive disorder), recurrent severe, without psychosis ([place]) 08/17/2013  . Morbid obesity ([place]) 06/30/2012  . Depression 01/26/2012  . Diabetes mellitus, type 2 ([place]) 01/26/2012  . HTN (hypertension) 01/26/2012    Past Medical History:  Diagnosis Date  . Abnormal Pap smear of cervix   . Anxiety   . Asthma   . Chest pain   . Complication of anesthesia    Pt stated "I gagged out after having ablation at [facility]"  . Depression   . Diabetes mellitus without complication ([place])   . Difficult intubation    glidescope, moderately difficult mask  . Hypertension   .  Migraine   . Sleep apnea    does not wear CPAP    Past Surgical History:  Procedure Laterality Date  . BACK SURGERY  2017  . HYSTEROSCOPY W/D&C N/A 04/29/2017   Procedure: DILATATION AND CURETTAGE /HYSTEROSCOPY WITH ULRASOUND GUIDANCE;  Surgeon: Osborne Oman, MD;  Location: [place];  Service: Gynecology;  Laterality: N/A;  . KNEE SURGERY Right    Torn meniscus  . TONSILLECTOMY    . uterine ablation      Social History   Tobacco Use  . Smoking status: Former Smoker    Types: Cigarettes    Last attempt to quit: 09/15/2008    Years since quitting: 8.7  . Smokeless tobacco: Never Used  Substance Use Topics  . Alcohol use: Yes    Alcohol/week: 1.2 oz    Types: 2 Glasses of wine per week    Comment: occasional  . Drug use: No    Family History  Problem  Relation Age of Onset  . Cancer Father        urethea  . Diabetes Mother   . Hypertension Mother     Allergies  Allergen Reactions  . Metformin And Related     Pt stopped due to persistent vomiting    Medication list has been reviewed and updated.  Current Outpatient Medications on File Prior to Visit  Medication Sig Dispense Refill  . albuterol (PROVENTIL HFA;VENTOLIN HFA) 108 (90 Base) MCG/ACT inhaler Inhale 1-2 puffs into the lungs every 6 (six) hours as needed for wheezing or shortness of breath. 18 g 5  . amLODipine (NORVASC) 10 MG tablet TAKE 1 TABLET (10 MG TOTAL) BY MOUTH DAILY. 90 tablet 3  . blood glucose meter kit and supplies KIT Dispense based on patient and insurance preference. Use up to four times daily as directed. (FOR ICD-9 250.00, 250.01). Strips with 11 refills  Lancets with 11 refills 1 each 0  . citalopram (CELEXA) 40 MG tablet TAKE 1 TABLET BY MOUTH DAILY FOR DEPRESSION 90 tablet 3  . cyclobenzaprine (FLEXERIL) 10 MG tablet TAKE 1 TABLET BY MOUTH TWICE A DAY AS NEEDED FOR MUSCLE SPASMS 30 tablet 0  . docusate sodium (COLACE) 100 MG capsule Take 1 capsule (100 mg total) by mouth 2 (two) times daily as needed for mild constipation or moderate constipation. 30 capsule 2  . hydrochlorothiazide (HYDRODIURIL) 25 MG tablet TAKE 1 TABLET (25 MG TOTAL) BY MOUTH DAILY. FOR HIGH BLOOD PRESSURE 90 tablet 2  . ibuprofen (ADVIL,MOTRIN) 600 MG tablet Take 1 tablet (600 mg total) by mouth every 8 (eight) hours as needed for headache, moderate pain or cramping. 30 tablet 2  . Insulin Glargine (BASAGLAR KWIKPEN) 100 UNIT/ML SOPN Take 35 units daily (Patient taking differently: 30 Units. Take 35 units daily) 18 mL 6  . losartan (COZAAR) 100 MG tablet TAKE 1 TABLET (100 MG TOTAL) BY MOUTH DAILY. FOR HIGH BLOOD PRESSURE 90 tablet 3  . metoprolol succinate (TOPROL-XL) 50 MG 24 hr tablet Take 1 tablet (50 mg total) by mouth daily. 30 tablet 11  . oxyCODONE-acetaminophen  (PERCOCET/ROXICET) 5-325 MG tablet Take 1 tablet by mouth every 4 (four) hours as needed for severe pain. 15 tablet 0  . pravastatin (PRAVACHOL) 40 MG tablet TAKE 1 TABLET (40 MG TOTAL) BY MOUTH DAILY. 90 tablet 3   No current facility-administered medications on file prior to visit.     Review of Systems:  As per HPI- otherwise negative. Pulse Readings from Last 3 Encounters:  06/01/17 90  05/13/17 (!) 108  04/29/17 99      Physical Examination: Vitals:   06/01/17 1540 06/01/17 1604  BP: (!) 138/92   Pulse: (!) 110 90  Resp: 18   SpO2: 99%    Vitals:   06/01/17 1540  Weight: (!) 317 lb (143.8 kg)  Height: _0  (1.727 m)   Body mass index is 48.2 kg/m. Ideal Body Weight: Weight in (lb) to have BMI = 25: 164.1  GEN: WDWN, NAD, Non-toxic, A & O x 3, obese, looks well  HEENT: Atraumatic, Normocephalic. Neck supple. No masses, No LAD.  Bilateral TM wnl, oropharynx normal.  PEERL,EOMI.   Ears and Nose: No external deformity. CV: RRR, No M/G/R. No JVD. No thrill. No extra heart sounds. PULM: CTA B, no wheezes, crackles, rhonchi. No retractions. No resp. distress. No accessory muscle use. ABD: S, NT, ND, +BS. No rebound. No HSM.  She has a firm, superficial mass in her right upper quadrant which is not tender  EXTR: No c/c/e NEURO Normal gait.  PSYCH: Normally interactive. Conversant. Not depressed or anxious appearing.  Calm demeanor.    Assessment and Plan: Uncontrolled type 2 diabetes mellitus without complication, with long-term current use of insulin ([place]) - Plan: glucose blood (ACCU-CHEK AVIVA) test strip, Hemoglobin A1c, glimepiride (AMARYL) 4 MG tablet, Insulin Pen Needle (PEN NEEDLES) 31G X 5 MM MISC  Right upper quadrant abdominal mass - Plan: Ambulatory referral to General Surgery  Blurred vision  Referral to general surgery about the mass in her belly Increase glimepiride to 8 mg Continue basaglar A1c today- Will plan further follow- up pending labs. Not  back at work yet due to vision problems  She is seeing her eye doc asap and we continue to work on her glucose control     Signed Lamar Blinks, MD

## 2017-06-01 ENCOUNTER — Encounter: Payer: Self-pay | Admitting: Family Medicine

## 2017-06-01 ENCOUNTER — Ambulatory Visit (INDEPENDENT_AMBULATORY_CARE_PROVIDER_SITE_OTHER): Payer: 59 | Admitting: Family Medicine

## 2017-06-01 VITALS — BP 138/92 | HR 90 | Resp 18 | Ht 68.0 in | Wt 317.0 lb

## 2017-06-01 DIAGNOSIS — Z794 Long term (current) use of insulin: Secondary | ICD-10-CM | POA: Diagnosis not present

## 2017-06-01 DIAGNOSIS — H538 Other visual disturbances: Secondary | ICD-10-CM

## 2017-06-01 DIAGNOSIS — E1165 Type 2 diabetes mellitus with hyperglycemia: Secondary | ICD-10-CM | POA: Diagnosis not present

## 2017-06-01 DIAGNOSIS — R1901 Right upper quadrant abdominal swelling, mass and lump: Secondary | ICD-10-CM | POA: Diagnosis not present

## 2017-06-01 DIAGNOSIS — IMO0001 Reserved for inherently not codable concepts without codable children: Secondary | ICD-10-CM

## 2017-06-01 MED ORDER — PEN NEEDLES 31G X 5 MM MISC: 1.0000 "pen " | Freq: Every day | 99 refills | Status: DC

## 2017-06-01 MED ORDER — GLUCOSE BLOOD VI STRP: ORAL_STRIP | 3 refills | Status: AC

## 2017-06-01 MED ORDER — GLIMEPIRIDE 4 MG PO TABS: 8.0000 mg | ORAL_TABLET | Freq: Every day | ORAL | 3 refills | Status: AC

## 2017-06-01 NOTE — Patient Instructions (Addendum)
Let's increase your glimepiride to 8 mg a day or 2 pills daily I will be in touch with your A1c  Let's ask Dr. Ihor Dow about the mass in your upper right belly-it might be possible to have general surgery remove this during your hysterectomy procedure  Take care!

## 2017-06-02 LAB — HEMOGLOBIN A1C: HEMOGLOBIN A1C: 10.6 % — AB (ref 4.6–6.5)

## 2017-06-05 ENCOUNTER — Encounter: Payer: Self-pay | Admitting: Family Medicine

## 2017-06-06 ENCOUNTER — Other Ambulatory Visit: Payer: Self-pay | Admitting: Family Medicine

## 2017-06-06 DIAGNOSIS — Z794 Long term (current) use of insulin: Principal | ICD-10-CM

## 2017-06-06 DIAGNOSIS — E1165 Type 2 diabetes mellitus with hyperglycemia: Principal | ICD-10-CM

## 2017-06-06 DIAGNOSIS — IMO0001 Reserved for inherently not codable concepts without codable children: Secondary | ICD-10-CM

## 2017-06-06 DIAGNOSIS — I10 Essential (primary) hypertension: Secondary | ICD-10-CM

## 2017-06-10 ENCOUNTER — Encounter: Payer: Self-pay | Admitting: Obstetrics & Gynecology

## 2017-06-10 ENCOUNTER — Ambulatory Visit (INDEPENDENT_AMBULATORY_CARE_PROVIDER_SITE_OTHER): Payer: 59 | Admitting: Obstetrics & Gynecology

## 2017-06-10 VITALS — BP 120/82 | HR 86 | Ht 68.0 in | Wt 313.8 lb

## 2017-06-10 DIAGNOSIS — N950 Postmenopausal bleeding: Secondary | ICD-10-CM

## 2017-06-10 NOTE — H&P (View-Only) (Signed)
History:  52 y.o. G2P1011 here today for discussion of surgical management of PMPB. She reports that despite the hysteroscopy she is continuing to have bleeding. She is not interested in further medical management.    The following portions of the patient's history were reviewed and updated as appropriate: allergies, current medications, past family history, past medical history, past social history, past surgical history and problem list.  Review of Systems:  Pertinent items are noted in HPI.    Objective:  Physical Exam Blood pressure 120/82, pulse 86, height 5\' 8"  (1.727 m), weight (!) 313 lb 12.8 oz (142.3 kg), last menstrual period 02/03/2014.  CONSTITUTIONAL: Well-developed, well-nourished female in no acute distress.  HENT:  Normocephalic, atraumatic EYES: Conjunctivae and EOM are normal. No scleral icterus.  NECK: Normal range of motion SKIN: Skin is warm and dry. No rash noted. Not diaphoretic.No pallor. [place]: Alert and oriented to person, place, and time. Normal coordination.  Abd: Soft, nontender and nondistended; obese.  Pelvic: Normal appearing external genitalia; normal appearing vaginal mucosa and cervix.  Normal discharge.  Enlarged uterus with some descensus. No uterine or adnexal tenderness. Exam limited by pt body habitus .  Labs and Imaging 03/09/2017 CLINICAL DATA:  Postmenopausal bleeding for 2 years, prior endometrial ablation  EXAM: TRANSABDOMINAL AND TRANSVAGINAL ULTRASOUND OF PELVIS  TECHNIQUE: Both transabdominal and transvaginal ultrasound examinations of the pelvis were performed. Transabdominal technique was performed for global imaging of the pelvis including uterus, ovaries, adnexal regions, and pelvic cul-de-sac. It was necessary to proceed with endovaginal exam following the transabdominal exam to visualize the endometrium and ovaries.  COMPARISON:  11/18/2006  FINDINGS: Uterus  Measurements: 8.6 x 3.8 x 4.1 cm. Mildly heterogeneous  myometrial echogenicity. Complicated nabothian cysts at cervix. Subtle hypoechoic nodule at anterior LEFT uterus 10 x 7 x 7 mm. No additional uterine masses.  Endometrium  Thickness: 11 mm thick. Suspected hypoechoic nodule within endometrium at upper uterine segment 12 x 7 x 10 mm. No endometrial fluid.  Right ovary  Measurements: 1.6 x 2.9 x 2.3 cm.  Normal morphology without mass  Left ovary  Measurements: 2.4 x 1.1 x 2.1 cm.  Normal morphology without mass  Other findings  Trace free pelvic fluid.  No adnexal masses.  IMPRESSION: Suspected small submucosal leiomyoma at the upper LEFT uterus 10 mm diameter.  Abnormal thickening of the endometrial complex 11 mm thick with potential 12 x 7 x 10 mm endometrial nodule at upper uterine segment; in the setting of post-menopausal bleeding, endometrial sampling is indicated to exclude carcinoma. If results are benign, sonohysterogram should be considered for focal lesion work-up. (Ref: Radiological Reasoning: Algorithmic Workup of Abnormal Vaginal Bleeding with Endovaginal Sonography and Sonohysterography. AJR 2008; 161:W96-04)  04/29/2017 Diagnosis Endometrium, curettage - BENIGN SQUAMOUS AND ENDOCERVICAL MUCOSA. - NO DEFINITIVE ENDOMETRIAL TISSUE.  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 Pt elects for definitive management.   Patient desires surgical management with TVH with bilateral salpingectomy if possible.  The risks of surgery were discussed in detail with the patient including but not limited to: bleeding which may require transfusion or reoperation; infection which may require prolonged hospitalization or re-hospitalization and antibiotic therapy; injury to bowel, bladder, ureters and major vessels or other surrounding organs; need for additional procedures including laparotomy; thromboembolic phenomenon, incisional problems and other postoperative or anesthesia  complications.  Patient was told that the likelihood that her condition and symptoms will be treated effectively with this surgical management was very high; the postoperative  expectations were also discussed in detail. The patient also understands the alternative treatment options which were discussed in full. All questions were answered.  She was told that she will be contacted by our surgical scheduler regarding the time and date of her surgery; routine preoperative instructions of having nothing to eat or drink after midnight on the day prior to surgery and also coming to the hospital 1 1/2 hours prior to her time of surgery were also emphasized.  She was told she may be called for a preoperative appointment about a week prior to surgery and will be given further preoperative instructions at that visit. Printed patient education handouts about the procedure were given to the patient to review at home.  Total face-to-face time with patient was 30 min.  Greater than 50% was spent in counseling and coordination of care with the patient.   Kaci Freel L. Harraway-Smith, M.D., [facility]

## 2017-06-10 NOTE — Progress Notes (Signed)
History:  52 y.o. G2P1011 here today for discussion of surgical management of PMPB. She reports that despite the hysteroscopy she is continuing to have bleeding. She is not interested in further medical management.    The following portions of the patient's history were reviewed and updated as appropriate: allergies, current medications, past family history, past medical history, past social history, past surgical history and problem list.  Review of Systems:  Pertinent items are noted in HPI.    Objective:  Physical Exam Blood pressure 120/82, pulse 86, height 5\' 8"  (1.727 m), weight (!) 313 lb 12.8 oz (142.3 kg), last menstrual period 02/03/2014.  CONSTITUTIONAL: Well-developed, well-nourished female in no acute distress.  HENT:  Normocephalic, atraumatic EYES: Conjunctivae and EOM are normal. No scleral icterus.  NECK: Normal range of motion SKIN: Skin is warm and dry. No rash noted. Not diaphoretic.No pallor. [place]: Alert and oriented to person, place, and time. Normal coordination.  Abd: Soft, nontender and nondistended; obese.  Pelvic: Normal appearing external genitalia; normal appearing vaginal mucosa and cervix.  Normal discharge.  Enlarged uterus with some descensus. No uterine or adnexal tenderness. Exam limited by pt body habitus .  Labs and Imaging 03/09/2017 CLINICAL DATA:  Postmenopausal bleeding for 2 years, prior endometrial ablation  EXAM: TRANSABDOMINAL AND TRANSVAGINAL ULTRASOUND OF PELVIS  TECHNIQUE: Both transabdominal and transvaginal ultrasound examinations of the pelvis were performed. Transabdominal technique was performed for global imaging of the pelvis including uterus, ovaries, adnexal regions, and pelvic cul-de-sac. It was necessary to proceed with endovaginal exam following the transabdominal exam to visualize the endometrium and ovaries.  COMPARISON:  11/18/2006  FINDINGS: Uterus  Measurements: 8.6 x 3.8 x 4.1 cm. Mildly heterogeneous  myometrial echogenicity. Complicated nabothian cysts at cervix. Subtle hypoechoic nodule at anterior LEFT uterus 10 x 7 x 7 mm. No additional uterine masses.  Endometrium  Thickness: 11 mm thick. Suspected hypoechoic nodule within endometrium at upper uterine segment 12 x 7 x 10 mm. No endometrial fluid.  Right ovary  Measurements: 1.6 x 2.9 x 2.3 cm.  Normal morphology without mass  Left ovary  Measurements: 2.4 x 1.1 x 2.1 cm.  Normal morphology without mass  Other findings  Trace free pelvic fluid.  No adnexal masses.  IMPRESSION: Suspected small submucosal leiomyoma at the upper LEFT uterus 10 mm diameter.  Abnormal thickening of the endometrial complex 11 mm thick with potential 12 x 7 x 10 mm endometrial nodule at upper uterine segment; in the setting of post-menopausal bleeding, endometrial sampling is indicated to exclude carcinoma. If results are benign, sonohysterogram should be considered for focal lesion work-up. (Ref: Radiological Reasoning: Algorithmic Workup of Abnormal Vaginal Bleeding with Endovaginal Sonography and Sonohysterography. AJR 2008; 962:I29-79)  04/29/2017 Diagnosis Endometrium, curettage - BENIGN SQUAMOUS AND ENDOCERVICAL MUCOSA. - NO DEFINITIVE ENDOMETRIAL TISSUE.  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 Pt elects for definitive management.   Patient desires surgical management with TVH with bilateral salpingectomy if possible.  The risks of surgery were discussed in detail with the patient including but not limited to: bleeding which may require transfusion or reoperation; infection which may require prolonged hospitalization or re-hospitalization and antibiotic therapy; injury to bowel, bladder, ureters and major vessels or other surrounding organs; need for additional procedures including laparotomy; thromboembolic phenomenon, incisional problems and other postoperative or anesthesia  complications.  Patient was told that the likelihood that her condition and symptoms will be treated effectively with this surgical management was very high; the postoperative  expectations were also discussed in detail. The patient also understands the alternative treatment options which were discussed in full. All questions were answered.  She was told that she will be contacted by our surgical scheduler regarding the time and date of her surgery; routine preoperative instructions of having nothing to eat or drink after midnight on the day prior to surgery and also coming to the hospital 1 1/2 hours prior to her time of surgery were also emphasized.  She was told she may be called for a preoperative appointment about a week prior to surgery and will be given further preoperative instructions at that visit. Printed patient education handouts about the procedure were given to the patient to review at home.  Total face-to-face time with patient was 30 min.  Greater than 50% was spent in counseling and coordination of care with the patient.   Vana Arif L. Harraway-Smith, M.D., [facility]

## 2017-06-10 NOTE — Patient Instructions (Signed)
Vaginal Hysterectomy A vaginal hysterectomy is a procedure to remove all or part of the uterus through a small incision in the vagina. In this procedure, your health care provider may remove your entire uterus, including the lower end (cervix). You may need a vaginal hysterectomy to treat:  Uterine fibroids.  A condition that causes the lining of the uterus to grow in other areas (endometriosis).  Problems with pelvic support.  Cancer of the cervix, ovaries, uterus, or tissue that lines the uterus (endometrium).  Excessive (dysfunctional) uterine bleeding.  When removing your uterus, your health care provider may also remove the organs that produce eggs (ovaries) and the tubes that carry eggs to your uterus (fallopian tubes). After a vaginal hysterectomy, you will no longer be able to have a baby. You will also no longer get your menstrual period. Tell a health care provider about:  Any allergies you have.  All medicines you are taking, including vitamins, herbs, eye drops, creams, and over-the-counter medicines.  Any problems you or family members have had with anesthetic medicines.  Any blood disorders you have.  Any surgeries you have had.  Any medical conditions you have.  Whether you are pregnant or may be pregnant. What are the risks? Generally, this is a safe procedure. However, problems may occur, including:  Bleeding.  Infection.  A blood clot that forms in your leg and travels to your lungs (pulmonary embolism).  Damage to surrounding organs.  Pain during sex.  What happens before the procedure?  Ask your health care provider what organs will be removed during surgery.  Ask your health care provider about: ? Changing or stopping your regular medicines. This is especially important if you are taking diabetes medicines or blood thinners. ? Taking medicines such as aspirin and ibuprofen. These medicines can thin your blood. Do not take these medicines before  your procedure if your health care provider instructs you not to.  Follow instructions from your health care provider about eating or drinking restrictions.  Do not use any tobacco products, such as cigarettes, chewing tobacco, and e-cigarettes. If you need help quitting, ask your health care provider.  Plan to have someone take you home after discharge from the hospital. What happens during the procedure?  To reduce your risk of infection: ? Your health care team will wash or sanitize their hands. ? Your skin will be washed with soap.  An IV tube will be inserted into one of your veins.  You may be given antibiotic medicine to help prevent infection.  You will be given one or more of the following: ? A medicine to help you relax (sedative). ? A medicine to numb the area (local anesthetic). ? A medicine to make you fall asleep (general anesthetic). ? A medicine that is injected into an area of your body to numb everything beyond the injection site (regional anesthetic).  Your surgeon will make an incision in your vagina.  Your surgeon will locate and remove all or part of your uterus.  Your ovaries and fallopian tubes may be removed at the same time.  The incision will be closed with stitches (sutures) that dissolve over time. The procedure may vary among health care providers and hospitals. What happens after the procedure?  Your blood pressure, heart rate, breathing rate, and blood oxygen level will be monitored often until the medicines you were given have worn off.  You will be encouraged to get up and walk around after a few hours to help prevent   complications.  You may have IV tubes in place for a few days.  You will be given pain medicine as needed.  Do not drive for 24 hours if you were given a sedative. This information is not intended to replace advice given to you by your health care provider. Make sure you discuss any questions you have with your health care  provider. Document Released: 04/23/2015 Document Revised: 06/07/2015 Document Reviewed: 01/14/2015 Elsevier Interactive Patient Education  2018 Elsevier Inc.  Vaginal Hysterectomy, Care After Refer to this sheet in the next few weeks. These instructions provide you with information about caring for yourself after your procedure. Your health care provider may also give you more specific instructions. Your treatment has been planned according to current medical practices, but problems sometimes occur. Call your health care provider if you have any problems or questions after your procedure. What can I expect after the procedure? After the procedure, it is common to have:  Pain.  Soreness and numbness in your incision areas.  Vaginal bleeding and discharge.  Constipation.  Temporary problems emptying the bladder.  Feelings of sadness or other emotions.  Follow these instructions at home: Medicines  Take over-the-counter and prescription medicines only as told by your health care provider.  If you were prescribed an antibiotic medicine, take it as told by your health care provider. Do not stop taking the antibiotic even if you start to feel better.  Do not drive or operate heavy machinery while taking prescription pain medicine. Activity  Return to your normal activities as told by your health care provider. Ask your health care provider what activities are safe for you.  Get regular exercise as told by your health care provider. You may be told to take short walks every day and go farther each time.  Do not lift anything that is heavier than 10 lb (4.5 kg). General instructions   Do not put anything in your vagina for 6 weeks after your surgery or as told by your health care provider. This includes tampons and douches.  Do not have sex until your health care provider says you can.  Do not take baths, swim, or use a hot tub until your health care provider approves.  Drink  enough fluid to keep your urine clear or pale yellow.  Do not drive for 24 hours if you were given a sedative.  Keep all follow-up visits as told by your health care provider. This is important. Contact a health care provider if:  Your pain medicine is not helping.  You have a fever.  You have redness, swelling, or pain at your incision site.  You have blood, pus, or a bad-smelling discharge from your vagina.  You continue to have difficulty urinating. Get help right away if:  You have severe abdominal or back pain.  You have heavy bleeding from your vagina.  You have chest pain or shortness of breath. This information is not intended to replace advice given to you by your health care provider. Make sure you discuss any questions you have with your health care provider. Document Released: 04/23/2015 Document Revised: 06/07/2015 Document Reviewed: 01/14/2015 Elsevier Interactive Patient Education  2018 Elsevier Inc.  

## 2017-06-11 ENCOUNTER — Encounter: Payer: Self-pay | Admitting: Obstetrics & Gynecology

## 2017-06-11 ENCOUNTER — Encounter (HOSPITAL_COMMUNITY): Payer: Self-pay

## 2017-06-11 ENCOUNTER — Encounter: Payer: Self-pay | Admitting: Family Medicine

## 2017-06-16 NOTE — Patient Instructions (Addendum)
Your procedure is scheduled on: Tuesday, June 25  Enter through the Main Entrance of [facility] at: 11:30 am  Pick up the phone at the desk and dial [phone].  Call this number if you have problems the morning of surgery: [phone].  Remember: Do NOT eat food after midnight Monday.   Do NOT drink clear liquids (including water) after 7 am Tuesday, day of surgery.  Take these medicines the morning of surgery with a SIP OF WATER: amlodipine, celexa, prevastatin  Do not take any diabetes medication on day of surgery.  We will check your blood sugar upon arrival and treat if needed.  Bring albuterol inhaler with you on day of surgery.  Stop herbal medications, vitamin supplements, ibuprofen/NSAIDS 1 week prior to surgery.  Do NOT wear jewelry (body piercing), metal hair clips/bobby pins, make-up, or nail polish. Do NOT wear lotions, powders, or perfumes.  You may wear deoderant. Do NOT shave for 48 hours prior to surgery. Do NOT bring valuables to the hospital.  Leave suitcase in car.  After surgery it may be brought to your room.  For patients admitted to the hospital, checkout time is 11:00 AM the day of discharge. Have a responsible adult drive you home and stay with you for 24 hours after your procedure.  Home with Mother Ronney Lion cell [phone].

## 2017-06-22 ENCOUNTER — Encounter (HOSPITAL_COMMUNITY): Payer: Self-pay

## 2017-06-22 ENCOUNTER — Other Ambulatory Visit: Payer: Self-pay

## 2017-06-22 ENCOUNTER — Encounter (HOSPITAL_COMMUNITY)
Admission: RE | Admit: 2017-06-22 | Discharge: 2017-06-22 | Disposition: A | Discharge status: Home / Self Care | Payer: 59 | Source: Ambulatory Visit | Attending: Obstetrics & Gynecology | Admitting: Obstetrics & Gynecology

## 2017-06-22 DIAGNOSIS — Z01812 Encounter for preprocedural laboratory examination: Secondary | ICD-10-CM | POA: Diagnosis present

## 2017-06-22 HISTORY — DX: Personal history of other medical treatment: Z92.89

## 2017-06-22 HISTORY — DX: Hyperlipidemia, unspecified: E78.5

## 2017-06-22 LAB — COMPREHENSIVE METABOLIC PANEL
ALBUMIN: 3.9 g/dL (ref 3.5–5.0)
ALT: 17 U/L (ref 14–54)
ANION GAP: 8 (ref 5–15)
AST: 18 U/L (ref 15–41)
Alkaline Phosphatase: 75 U/L (ref 38–126)
BILIRUBIN TOTAL: 0.1 mg/dL — AB (ref 0.3–1.2)
BUN: 12 mg/dL (ref 6–20)
CALCIUM: 9.2 mg/dL (ref 8.9–10.3)
CO2: 27 mmol/L (ref 22–32)
Chloride: 102 mmol/L (ref 101–111)
Creatinine, Ser: 0.85 mg/dL (ref 0.44–1.00)
GFR calc non Af Amer: >60 mL/min (ref >60)
GLUCOSE: 242 mg/dL — AB (ref 65–99)
Potassium: 3.9 mmol/L (ref 3.5–5.1)
Sodium: 137 mmol/L (ref 135–145)
TOTAL PROTEIN: 8.7 g/dL — AB (ref 6.5–8.1)

## 2017-06-22 LAB — CBC
HEMATOCRIT: 38.9 % (ref 36.0–46.0)
Hemoglobin: 12.8 g/dL (ref 12.0–15.0)
MCH: 28.8 pg (ref 26.0–34.0)
MCHC: 32.9 g/dL (ref 30.0–36.0)
MCV: 87.4 fL (ref 78.0–100.0)
Platelets: 231 10*3/uL (ref 150–400)
RBC: 4.45 MIL/uL (ref 3.87–5.11)
RDW: 13.7 % (ref 11.5–15.5)
WBC: 5.9 10*3/uL (ref 4.0–10.5)

## 2017-06-22 LAB — TYPE AND SCREEN
ABO/RH(D): A POS
ANTIBODY SCREEN: POSITIVE

## 2017-06-22 NOTE — Pre-Procedure Instructions (Signed)
SDS BB History Log given to lab for patient's previous blood transfusion in 03/2015 at [facility].

## 2017-06-22 NOTE — Pre-Procedure Instructions (Signed)
Dr Valma Cava informed that patient's blood sugar at PAT was 242.  No orders given.  [place] for surgery.  Patient instructed to take her diabetes medications as directed until DOS.  Patient verbalized understanding.

## 2017-06-25 ENCOUNTER — Other Ambulatory Visit: Payer: Self-pay | Admitting: General Surgery

## 2017-06-25 DIAGNOSIS — R1901 Right upper quadrant abdominal swelling, mass and lump: Secondary | ICD-10-CM

## 2017-06-30 ENCOUNTER — Ambulatory Visit
Admission: RE | Admit: 2017-06-30 | Discharge: 2017-06-30 | Disposition: A | Discharge status: Home / Self Care | Payer: 59 | Source: Ambulatory Visit | Attending: General Surgery | Admitting: General Surgery

## 2017-06-30 DIAGNOSIS — R1901 Right upper quadrant abdominal swelling, mass and lump: Secondary | ICD-10-CM

## 2017-06-30 MED ORDER — IOPAMIDOL (ISOVUE-300) INJECTION 61%
125.0000 mL | Freq: Once | INTRAVENOUS | Status: AC | PRN
  Administered 2017-06-30: 125 mL via INTRAVENOUS

## 2017-07-07 ENCOUNTER — Encounter (HOSPITAL_COMMUNITY)
Admission: AD | Disposition: A | Discharge status: Home / Self Care | Payer: Self-pay | Source: Ambulatory Visit | Attending: Obstetrics & Gynecology

## 2017-07-07 ENCOUNTER — Observation Stay (HOSPITAL_COMMUNITY)
Admission: AD | Admit: 2017-07-07 | Discharge: 2017-07-08 | Disposition: A | Discharge status: Home / Self Care | Payer: 59 | Source: Ambulatory Visit | Attending: Obstetrics & Gynecology | Admitting: Obstetrics & Gynecology

## 2017-07-07 ENCOUNTER — Other Ambulatory Visit: Payer: Self-pay

## 2017-07-07 ENCOUNTER — Ambulatory Visit (HOSPITAL_COMMUNITY): Payer: 59 | Admitting: Anesthesiology

## 2017-07-07 ENCOUNTER — Encounter (HOSPITAL_COMMUNITY): Payer: Self-pay

## 2017-07-07 DIAGNOSIS — N7091 Salpingitis, unspecified: Secondary | ICD-10-CM | POA: Insufficient documentation

## 2017-07-07 DIAGNOSIS — G4730 Sleep apnea, unspecified: Secondary | ICD-10-CM | POA: Diagnosis not present

## 2017-07-07 DIAGNOSIS — Z79899 Other long term (current) drug therapy: Secondary | ICD-10-CM | POA: Diagnosis not present

## 2017-07-07 DIAGNOSIS — Z6833 Body mass index (BMI) 33.0-33.9, adult: Secondary | ICD-10-CM | POA: Insufficient documentation

## 2017-07-07 DIAGNOSIS — N800 Endometriosis of uterus: Secondary | ICD-10-CM | POA: Diagnosis not present

## 2017-07-07 DIAGNOSIS — D250 Submucous leiomyoma of uterus: Secondary | ICD-10-CM | POA: Diagnosis not present

## 2017-07-07 DIAGNOSIS — N736 Female pelvic peritoneal adhesions (postinfective): Secondary | ICD-10-CM | POA: Diagnosis not present

## 2017-07-07 DIAGNOSIS — I10 Essential (primary) hypertension: Secondary | ICD-10-CM | POA: Insufficient documentation

## 2017-07-07 DIAGNOSIS — J45909 Unspecified asthma, uncomplicated: Secondary | ICD-10-CM | POA: Insufficient documentation

## 2017-07-07 DIAGNOSIS — Z87891 Personal history of nicotine dependence: Secondary | ICD-10-CM | POA: Insufficient documentation

## 2017-07-07 DIAGNOSIS — E119 Type 2 diabetes mellitus without complications: Secondary | ICD-10-CM | POA: Diagnosis not present

## 2017-07-07 DIAGNOSIS — N950 Postmenopausal bleeding: Secondary | ICD-10-CM

## 2017-07-07 DIAGNOSIS — Z90710 Acquired absence of both cervix and uterus: Secondary | ICD-10-CM | POA: Diagnosis not present

## 2017-07-07 HISTORY — PX: VAGINAL HYSTERECTOMY: SHX2639

## 2017-07-07 LAB — GLUCOSE, CAPILLARY
Glucose-Capillary: 199 mg/dL — ABNORMAL HIGH (ref 70–99)
Glucose-Capillary: 226 mg/dL — ABNORMAL HIGH (ref 70–99)
Glucose-Capillary: 206 mg/dL — ABNORMAL HIGH (ref 70–99)

## 2017-07-07 LAB — PREGNANCY, URINE: PREG TEST UR: NEGATIVE

## 2017-07-07 SURGERY — HYSTERECTOMY, VAGINAL
Anesthesia: General | Site: Vagina | Laterality: Bilateral

## 2017-07-07 MED ORDER — IBUPROFEN 600 MG PO TABS: 600.0000 mg | ORAL_TABLET | Freq: Four times a day (QID) | ORAL | Status: DC | PRN

## 2017-07-07 MED ORDER — ONDANSETRON HCL 4 MG/2ML IJ SOLN
INTRAMUSCULAR | Status: AC
  Filled 2017-07-07: qty 2

## 2017-07-07 MED ORDER — EPHEDRINE 5 MG/ML INJ
INTRAVENOUS | Status: AC
  Filled 2017-07-07: qty 10

## 2017-07-07 MED ORDER — MENTHOL 3 MG MT LOZG: 1.0000 | LOZENGE | OROMUCOSAL | Status: DC | PRN

## 2017-07-07 MED ORDER — KETOROLAC TROMETHAMINE 30 MG/ML IJ SOLN
30.0000 mg | Freq: Four times a day (QID) | INTRAMUSCULAR | Status: DC
  Administered 2017-07-07 – 2017-07-08 (×3): 30 mg via INTRAVENOUS
  Filled 2017-07-07 (×3): qty 1

## 2017-07-07 MED ORDER — ROCURONIUM BROMIDE 100 MG/10ML IV SOLN
INTRAVENOUS | Status: DC | PRN
  Administered 2017-07-07: 50 mg via INTRAVENOUS

## 2017-07-07 MED ORDER — FENTANYL CITRATE (PF) 100 MCG/2ML IJ SOLN
INTRAMUSCULAR | Status: AC
  Filled 2017-07-07: qty 2

## 2017-07-07 MED ORDER — MIDAZOLAM HCL 2 MG/2ML IJ SOLN
INTRAMUSCULAR | Status: AC
  Filled 2017-07-07: qty 2

## 2017-07-07 MED ORDER — LACTATED RINGERS IV SOLN
INTRAVENOUS | Status: DC
  Administered 2017-07-07: 125 mL/h via INTRAVENOUS

## 2017-07-07 MED ORDER — ALUM & MAG HYDROXIDE-SIMETH 200-200-20 MG/5ML PO SUSP
30.0000 mL | ORAL | Status: DC | PRN
Start: 2017-07-07 — End: 2017-07-08

## 2017-07-07 MED ORDER — LACTATED RINGERS IV SOLN: INTRAVENOUS | Status: DC

## 2017-07-07 MED ORDER — BUPIVACAINE HCL (PF) 0.5 % IJ SOLN
INTRAMUSCULAR | Status: DC | PRN
  Administered 2017-07-07: 30 mL

## 2017-07-07 MED ORDER — METOCLOPRAMIDE HCL 5 MG/ML IJ SOLN: 10.0000 mg | Freq: Once | INTRAMUSCULAR | Status: DC | PRN

## 2017-07-07 MED ORDER — GLIMEPIRIDE 4 MG PO TABS
8.0000 mg | ORAL_TABLET | Freq: Every day | ORAL | Status: DC
  Administered 2017-07-08: 8 mg via ORAL
  Filled 2017-07-07: qty 2

## 2017-07-07 MED ORDER — LOSARTAN POTASSIUM 50 MG PO TABS
100.0000 mg | ORAL_TABLET | Freq: Every day | ORAL | Status: DC
Start: 2017-07-07 — End: 2017-07-08
  Administered 2017-07-07 – 2017-07-08 (×2): 100 mg via ORAL
  Filled 2017-07-07 (×2): qty 2

## 2017-07-07 MED ORDER — LACTATED RINGERS IV SOLN
INTRAVENOUS | Status: DC
  Administered 2017-07-07: 19:00:00 via INTRAVENOUS

## 2017-07-07 MED ORDER — PANTOPRAZOLE SODIUM 40 MG PO TBEC
40.0000 mg | DELAYED_RELEASE_TABLET | Freq: Every day | ORAL | Status: DC
  Administered 2017-07-08: 40 mg via ORAL
  Filled 2017-07-07: qty 1

## 2017-07-07 MED ORDER — OXYCODONE-ACETAMINOPHEN 5-325 MG PO TABS
1.0000 | ORAL_TABLET | ORAL | Status: DC | PRN
  Administered 2017-07-08: 1 via ORAL
  Filled 2017-07-07 (×2): qty 1

## 2017-07-07 MED ORDER — CYCLOBENZAPRINE HCL 10 MG PO TABS
10.0000 mg | ORAL_TABLET | Freq: Three times a day (TID) | ORAL | Status: DC | PRN
  Administered 2017-07-08: 10 mg via ORAL
  Filled 2017-07-07 (×2): qty 1

## 2017-07-07 MED ORDER — SODIUM CHLORIDE 0.9 % IV SOLN
2.0000 g | INTRAVENOUS | Status: AC
  Administered 2017-07-07: 200 g via INTRAVENOUS
  Administered 2017-07-07: 2 g via INTRAVENOUS

## 2017-07-07 MED ORDER — HYDROCHLOROTHIAZIDE 25 MG PO TABS
25.0000 mg | ORAL_TABLET | Freq: Every day | ORAL | Status: DC
  Administered 2017-07-08: 25 mg via ORAL
  Filled 2017-07-07: qty 1

## 2017-07-07 MED ORDER — MAGNESIUM CITRATE PO SOLN
1.0000 | Freq: Once | ORAL | Status: DC | PRN
  Filled 2017-07-07: qty 296

## 2017-07-07 MED ORDER — ONDANSETRON HCL 4 MG/2ML IJ SOLN
INTRAMUSCULAR | Status: DC | PRN
  Administered 2017-07-07: 4 mg via INTRAVENOUS

## 2017-07-07 MED ORDER — PROPOFOL 10 MG/ML IV BOLUS
INTRAVENOUS | Status: AC
  Filled 2017-07-07: qty 20

## 2017-07-07 MED ORDER — LACTATED RINGERS IV SOLN
INTRAVENOUS | Status: DC
  Administered 2017-07-07 (×2): via INTRAVENOUS

## 2017-07-07 MED ORDER — KETOROLAC TROMETHAMINE 30 MG/ML IJ SOLN
INTRAMUSCULAR | Status: DC | PRN
  Administered 2017-07-07: 30 mg via INTRAVENOUS

## 2017-07-07 MED ORDER — FENTANYL CITRATE (PF) 100 MCG/2ML IJ SOLN
INTRAMUSCULAR | Status: DC | PRN
  Administered 2017-07-07: 100 ug via INTRAVENOUS
  Administered 2017-07-07 (×2): 50 ug via INTRAVENOUS
  Administered 2017-07-07: 100 ug via INTRAVENOUS

## 2017-07-07 MED ORDER — HYDROMORPHONE HCL 1 MG/ML IJ SOLN
0.2000 mg | INTRAMUSCULAR | Status: DC | PRN
  Administered 2017-07-07: 0.6 mg via INTRAVENOUS
  Filled 2017-07-07: qty 1

## 2017-07-07 MED ORDER — ROCURONIUM BROMIDE 100 MG/10ML IV SOLN
INTRAVENOUS | Status: AC
  Filled 2017-07-07: qty 1

## 2017-07-07 MED ORDER — PRAVASTATIN SODIUM 40 MG PO TABS: 40.0000 mg | ORAL_TABLET | Freq: Every day | ORAL | Status: DC

## 2017-07-07 MED ORDER — FENTANYL CITRATE (PF) 100 MCG/2ML IJ SOLN
INTRAMUSCULAR | Status: AC
  Administered 2017-07-07: 25 ug via INTRAVENOUS
  Filled 2017-07-07: qty 2

## 2017-07-07 MED ORDER — LIDOCAINE HCL (CARDIAC) PF 100 MG/5ML IV SOSY
PREFILLED_SYRINGE | INTRAVENOUS | Status: DC | PRN
  Administered 2017-07-07: 100 mg via INTRAVENOUS

## 2017-07-07 MED ORDER — PROPOFOL 10 MG/ML IV BOLUS
INTRAVENOUS | Status: DC | PRN
  Administered 2017-07-07: 200 mg via INTRAVENOUS

## 2017-07-07 MED ORDER — SCOPOLAMINE 1 MG/3DAYS TD PT72
MEDICATED_PATCH | TRANSDERMAL | Status: AC
  Administered 2017-07-07: 1.5 mg via TRANSDERMAL
  Filled 2017-07-07: qty 1

## 2017-07-07 MED ORDER — ALBUTEROL SULFATE (2.5 MG/3ML) 0.083% IN NEBU: 3.0000 mL | INHALATION_SOLUTION | Freq: Four times a day (QID) | RESPIRATORY_TRACT | Status: DC | PRN

## 2017-07-07 MED ORDER — ONDANSETRON HCL 4 MG PO TABS: 4.0000 mg | ORAL_TABLET | Freq: Four times a day (QID) | ORAL | Status: DC | PRN

## 2017-07-07 MED ORDER — EPHEDRINE SULFATE 50 MG/ML IJ SOLN
INTRAMUSCULAR | Status: DC | PRN
  Administered 2017-07-07: 5 mg via INTRAVENOUS

## 2017-07-07 MED ORDER — ZOLPIDEM TARTRATE 5 MG PO TABS: 5.0000 mg | ORAL_TABLET | Freq: Every evening | ORAL | Status: DC | PRN

## 2017-07-07 MED ORDER — AMLODIPINE BESYLATE 10 MG PO TABS
10.0000 mg | ORAL_TABLET | Freq: Every day | ORAL | Status: DC
  Administered 2017-07-08: 10 mg via ORAL
  Filled 2017-07-07: qty 1

## 2017-07-07 MED ORDER — SIMETHICONE 80 MG PO CHEW: 80.0000 mg | CHEWABLE_TABLET | Freq: Four times a day (QID) | ORAL | Status: DC | PRN

## 2017-07-07 MED ORDER — FENTANYL CITRATE (PF) 250 MCG/5ML IJ SOLN
INTRAMUSCULAR | Status: AC
  Filled 2017-07-07: qty 5

## 2017-07-07 MED ORDER — FENTANYL CITRATE (PF) 100 MCG/2ML IJ SOLN
25.0000 ug | INTRAMUSCULAR | Status: DC | PRN
  Administered 2017-07-07: 50 ug via INTRAVENOUS
  Administered 2017-07-07: 25 ug via INTRAVENOUS

## 2017-07-07 MED ORDER — MEPERIDINE HCL 25 MG/ML IJ SOLN: 6.2500 mg | INTRAMUSCULAR | Status: DC | PRN

## 2017-07-07 MED ORDER — PHENYLEPHRINE 40 MCG/ML (10ML) SYRINGE FOR IV PUSH (FOR BLOOD PRESSURE SUPPORT)
PREFILLED_SYRINGE | INTRAVENOUS | Status: AC
  Filled 2017-07-07: qty 10

## 2017-07-07 MED ORDER — SENNOSIDES-DOCUSATE SODIUM 8.6-50 MG PO TABS: 1.0000 | ORAL_TABLET | Freq: Every evening | ORAL | Status: DC | PRN

## 2017-07-07 MED ORDER — BUPIVACAINE HCL (PF) 0.5 % IJ SOLN
INTRAMUSCULAR | Status: AC
  Filled 2017-07-07: qty 30

## 2017-07-07 MED ORDER — MIDAZOLAM HCL 2 MG/2ML IJ SOLN
INTRAMUSCULAR | Status: DC | PRN
  Administered 2017-07-07: 2 mg via INTRAVENOUS

## 2017-07-07 MED ORDER — SCOPOLAMINE 1 MG/3DAYS TD PT72
1.0000 | MEDICATED_PATCH | Freq: Once | TRANSDERMAL | Status: DC
  Administered 2017-07-07: 1.5 mg via TRANSDERMAL

## 2017-07-07 MED ORDER — SODIUM CHLORIDE 0.9 % IV SOLN
INTRAVENOUS | Status: AC
  Filled 2017-07-07: qty 2

## 2017-07-07 MED ORDER — ONDANSETRON HCL 4 MG/2ML IJ SOLN: 4.0000 mg | Freq: Four times a day (QID) | INTRAMUSCULAR | Status: DC | PRN

## 2017-07-07 MED ORDER — OXYCODONE-ACETAMINOPHEN 5-325 MG PO TABS
2.0000 | ORAL_TABLET | ORAL | Status: DC | PRN
  Administered 2017-07-08: 2 via ORAL
  Filled 2017-07-07: qty 2

## 2017-07-07 MED ORDER — SUGAMMADEX SODIUM 500 MG/5ML IV SOLN
INTRAVENOUS | Status: AC
  Filled 2017-07-07: qty 5

## 2017-07-07 MED ORDER — CITALOPRAM HYDROBROMIDE 40 MG PO TABS
40.0000 mg | ORAL_TABLET | Freq: Every day | ORAL | Status: DC
  Administered 2017-07-08: 40 mg via ORAL
  Filled 2017-07-07: qty 1

## 2017-07-07 MED ORDER — LIDOCAINE HCL (CARDIAC) PF 100 MG/5ML IV SOSY
PREFILLED_SYRINGE | INTRAVENOUS | Status: AC
  Filled 2017-07-07: qty 5

## 2017-07-07 MED ORDER — DOCUSATE SODIUM 100 MG PO CAPS
100.0000 mg | ORAL_CAPSULE | Freq: Two times a day (BID) | ORAL | Status: DC
  Administered 2017-07-07 – 2017-07-08 (×2): 100 mg via ORAL
  Filled 2017-07-07 (×2): qty 1

## 2017-07-07 MED ORDER — PHENYLEPHRINE HCL 10 MG/ML IJ SOLN
INTRAMUSCULAR | Status: DC | PRN
  Administered 2017-07-07 (×2): 80 ug via INTRAVENOUS

## 2017-07-07 MED ORDER — SUGAMMADEX SODIUM 200 MG/2ML IV SOLN
INTRAVENOUS | Status: DC | PRN
  Administered 2017-07-07: 300 mg via INTRAVENOUS

## 2017-07-07 MED ORDER — BISACODYL 10 MG RE SUPP: 10.0000 mg | Freq: Every day | RECTAL | Status: DC | PRN

## 2017-07-07 SURGICAL SUPPLY — 20 items
CANISTER SUCT 3000ML PPV (MISCELLANEOUS) ×3 IMPLANT
CONT PATH 16OZ SNAP LID 3702 (MISCELLANEOUS) ×2 IMPLANT
DECANTER SPIKE VIAL GLASS SM (MISCELLANEOUS) ×3 IMPLANT
GAUZE PACKING 2X5 YD STRL (GAUZE/BANDAGES/DRESSINGS) IMPLANT
GLOVE BIOGEL PI IND STRL 6.5 (GLOVE) ×1 IMPLANT
GLOVE BIOGEL PI IND STRL 7.0 (GLOVE) ×2 IMPLANT
GLOVE BIOGEL PI INDICATOR 6.5 (GLOVE) ×2
GLOVE BIOGEL PI INDICATOR 7.0 (GLOVE) ×4
GLOVE ECLIPSE 7.0 STRL STRAW (GLOVE) ×3 IMPLANT
GOWN STRL REUS W/TWL LRG LVL3 (GOWN DISPOSABLE) ×12 IMPLANT
NS IRRIG 1000ML POUR BTL (IV SOLUTION) ×3 IMPLANT
PACK VAGINAL WOMENS (CUSTOM PROCEDURE TRAY) ×3 IMPLANT
PAD OB MATERNITY 4.3X12.25 (PERSONAL CARE ITEMS) ×3 IMPLANT
SUT VIC AB 0 CT1 18XCR BRD8 (SUTURE) ×3 IMPLANT
SUT VIC AB 0 CT1 27 (SUTURE) ×6
SUT VIC AB 0 CT1 27XBRD ANBCTR (SUTURE) ×2 IMPLANT
SUT VIC AB 0 CT1 8-18 (SUTURE) ×9
SUT VICRYL 0 TIES 12 18 (SUTURE) ×3 IMPLANT
TOWEL OR 17X24 6PK STRL BLUE (TOWEL DISPOSABLE) ×6 IMPLANT
TRAY FOLEY W/BAG SLVR 14FR (SET/KITS/TRAYS/PACK) ×3 IMPLANT

## 2017-07-07 NOTE — Interval H&P Note (Signed)
History and Physical Interval Note 07/07/2017 12:13 PM  Nicole Hubbard  has presented today for surgery, with the diagnosis of PMB  The various methods of treatment have been discussed with the patient and family. After consideration of risks, benefits and other options for treatment, the patient has consented to  [place] SALPINGECTOMY (Bilateral) as a surgical intervention .  The patient's history has been reviewed, patient examined, no change in status, stable for surgery.  I have reviewed the patient's chart and labs.  Questions were answered to the patient's satisfaction. To OR when ready.     Verita Schneiders, MD, [place] for [employer], [place]

## 2017-07-07 NOTE — Transfer of Care (Signed)
Immediate Anesthesia Transfer of Care Note  Patient: Nicole Hubbard  Procedure(s) Performed: HYSTERECTOMY VAGINAL WITH SALPINGECTOMY (Bilateral Vagina )  Patient Location: PACU  Anesthesia Type:General  Level of Consciousness: awake, alert  and oriented  Airway & Oxygen Therapy: Patient Spontanous Breathing and Patient connected to face mask oxygen  Post-op Assessment: Report given to RN and Post -op Vital signs reviewed and stable  Post vital signs: Reviewed and stable  Last Vitals:  Vitals Value Taken Time  BP 184/114 07/07/2017  3:23 PM  Temp    Pulse 110 07/07/2017  3:29 PM  Resp 22 07/07/2017  3:29 PM  SpO2 97 % 07/07/2017  3:29 PM  Vitals shown include unvalidated device data.  Last Pain:  Vitals:   07/07/17 1117  PainSc: 4       Patients Stated Pain Goal: 3 (84/13/24 4010)  Complications: No apparent anesthesia complications

## 2017-07-07 NOTE — Plan of Care (Signed)
  Problem: Education: Goal: Knowledge of General Education information will improve Outcome: Progressing   Problem: Health Behavior/Discharge Planning: Goal: Ability to manage health-related needs will improve Outcome: Progressing   Problem: Clinical Measurements: Goal: Ability to maintain clinical measurements within normal limits will improve Outcome: Progressing   Problem: Nutrition: Goal: Adequate nutrition will be maintained Outcome: Progressing   Problem: Elimination: Goal: Will not experience complications related to bowel motility Outcome: Progressing   Problem: Pain Managment: Goal: General experience of comfort will improve Outcome: Progressing

## 2017-07-07 NOTE — Anesthesia Preprocedure Evaluation (Signed)
Anesthesia Evaluation  Patient identified by MRN, date of birth, ID band Patient awake    Reviewed: Allergy & Precautions, NPO status , Patient's Chart, lab work & pertinent test results  History of Anesthesia Complications (+) DIFFICULT AIRWAY  Airway Mallampati: III  TM Distance: >3 FB Neck ROM: Full    Dental no notable dental hx.    Pulmonary asthma , sleep apnea , former smoker,    Pulmonary exam normal breath sounds clear to auscultation       Cardiovascular hypertension, Pt. on medications Normal cardiovascular exam Rhythm:Regular Rate:Normal     Neuro/Psych negative neurological ROS  negative psych ROS   GI/Hepatic negative GI ROS, Neg liver ROS,   Endo/Other  diabetes, Type 2Morbid obesity  Renal/GU negative Renal ROS  negative genitourinary   Musculoskeletal negative musculoskeletal ROS (+)   Abdominal   Peds negative pediatric ROS (+)  Hematology negative hematology ROS (+)   Anesthesia Other Findings   Reproductive/Obstetrics negative OB ROS                             Anesthesia Physical Anesthesia Plan  ASA: III  Anesthesia Plan: General   Post-op Pain Management:    Induction: Intravenous  PONV Risk Score and Plan: 4 or greater and Ondansetron, Dexamethasone, Midazolam, Scopolamine patch - Pre-op and Treatment may vary due to age or medical condition  Airway Management Planned: Video Laryngoscope Planned  Additional Equipment:   Intra-op Plan:   Post-operative Plan: Extubation in OR  Informed Consent: I have reviewed the patients History and Physical, chart, labs and discussed the procedure including the risks, benefits and alternatives for the proposed anesthesia with the patient or authorized representative who has indicated his/her understanding and acceptance.   Dental advisory given  Plan Discussed with: CRNA  Anesthesia Plan Comments:          Anesthesia Quick Evaluation

## 2017-07-07 NOTE — Anesthesia Procedure Notes (Signed)
Procedure Name: Intubation Date/Time: 07/07/2017 1:16 PM Performed by: Genevie Ann, CRNA Pre-anesthesia Checklist: Patient identified, Emergency Drugs available, Suction available, Patient being monitored and Timeout performed Patient Re-evaluated:Patient Re-evaluated prior to induction Oxygen Delivery Method: Circle system utilized Preoxygenation: Pre-oxygenation with 100% oxygen Induction Type: IV induction Ventilation: Mask ventilation without difficulty and Two handed mask ventilation required Laryngoscope Size: Mac, Glidescope and 3 Grade View: Grade I Tube size: 7.0 mm Number of attempts: 1 Airway Equipment and Method: Patient positioned with wedge pillow Placement Confirmation: ETT inserted through vocal cords under direct vision,  positive ETCO2,  CO2 detector and breath sounds checked- equal and bilateral Secured at: 20 cm Dental Injury: Teeth and Oropharynx as per pre-operative assessment

## 2017-07-07 NOTE — Op Note (Addendum)
Norval Gable PROCEDURE DATE: 07/07/2017  PREOPERATIVE DIAGNOSIS:  Postmenopausal bleeding status post endometrial ablation many years ago POSTOPERATIVE DIAGNOSIS:  The same OPERATION:  Total Vaginal Hysterectomy, Bilateral Salpingectomy SURGEON:   Verita Schneiders, M.D. ASSISTANT: Arlina Robes, M.D.  An experienced assistant was required given the standard of surgical care given the complexity of the case.  This assistant was needed for exposure, dissection, suctioning, retraction, instrument exchange, and for overall help during the procedure.  INDICATIONS: The patient is a 52 y.o. G2P1011 with aforementioned diagnosis. The patient made a decision to undergo definite surgical treatment. On the preoperative visit, the risks, benefits, indications, and alternatives of the procedure were reviewed with the patient.  On the day of surgery, the risks of surgery were again discussed with the patient including but not limited to: bleeding which may require transfusion or reoperation; infection which may require antibiotics; injury to bowel, bladder, ureters or other surrounding organs; need for additional procedures; thromboembolic phenomenon, incisional problems and other postoperative/anesthesia complications. Written informed consent was obtained.    OPERATIVE FINDINGS: A 8 week size uterus with normal tubes and ovaries bilaterally.  ANESTHESIA:  General endotracheal. ESTIMATED BLOOD LOSS: 100 ml FLUIDS:  1000 ml of Lactated Ringers URINE OUTPUT:  100 ml of clear yellow urine. SPECIMENS:  Uterus, cervix and fallopian tubes sent to pathology COMPLICATIONS:  None immediate.  DESCRIPTION OF PROCEDURE: The patient received intravenous antibiotics and had sequential compression devices applied to her lower extremities while in the preoperative area.  She was then taken to the operating room where general anesthesia was administered and was found to be adequate.  She was placed in the dorsal lithotomy  position, and was prepped and draped in a sterile manner.  A Foley catheter was inserted into her bladder and attached to constant drainage. After an adequate timeout was performed, attention was turned to her pelvis.  A weighted speculum was then placed in the vagina, and the anterior and posterior lips of the cervix were grasped bilaterally with tenaculums.  The cervix was then injected circumferentially with 0.25% Marcaine with epinephrine solution to maintain hemostasis.  The cervix was then circumferentially incised, and the bladder was dissected off the pubocervical fascia anteriorly without complication. The anterior cul-de-sac was then entered sharply without difficulty and a retractor was placed.  The same procedure was performed posteriorly and the posterior cul-de-sac was entered sharply without difficulty.  A long weighted speculum was inserted into the posterior cul-de-sac.  The Heaney clamp was then used to clamp the uterosacral ligaments on either side.  They were then cut and sutured ligated with 0 Vicryl, and the ligated uterosacral ligaments were transfixed to the ipsilateral vaginal epithelium to further support the vagina and provide hemostasis. Of note, all sutures used in this case were 0 Vicryl unless otherwise noted.   The cardinal ligaments were then clamped, cut and ligated. The uterine vessels and broad ligaments were then serially clamped with the Heaney clamps, cut, and suture ligated on both sides.  Excellent hemostasis was noted at this point.  The uterus was then delivered via the posterior cul-de-sac, and the cornua were clamped with the Heaney clamps, transected, and the uterus was delivered and sent to pathology. These pedicles were then suture ligated to ensure hemostasis.  Kelly clamps were placed on the mesosalpinx of the right fallopian tube, and the fallopian tube was excised.  The pedicle was then secured with a free tie.  A similar process was carried out on the left side,  allowing for bilateral salpingectomy.   After completion of the hysterectomy, all pedicles from the uterosacral ligament to the cornua were examined hemostasis was confirmed.  The vaginal cuff was then closed with a in a running locked fashion with care given to incorporate the uterosacral pedicles bilaterally.  All instruments were then removed from the pelvis.  The patient tolerated the procedure well.  All instruments, needles, and sponge counts were correct x 2. The patient was taken to the recovery room in stable condition.     Verita Schneiders, MD, [place] Attending [place], [facility]

## 2017-07-08 ENCOUNTER — Encounter (HOSPITAL_COMMUNITY): Payer: Self-pay | Admitting: Obstetrics & Gynecology

## 2017-07-08 ENCOUNTER — Encounter: Payer: Self-pay | Admitting: Family Medicine

## 2017-07-08 DIAGNOSIS — G4733 Obstructive sleep apnea (adult) (pediatric): Secondary | ICD-10-CM

## 2017-07-08 DIAGNOSIS — D250 Submucous leiomyoma of uterus: Secondary | ICD-10-CM | POA: Diagnosis not present

## 2017-07-08 LAB — COMPREHENSIVE METABOLIC PANEL
ALBUMIN: 3.6 g/dL (ref 3.5–5.0)
ALT: 13 U/L (ref 0–44)
ANION GAP: 10 (ref 5–15)
AST: 15 U/L (ref 15–41)
Alkaline Phosphatase: 67 U/L (ref 38–126)
BUN: 10 mg/dL (ref 6–20)
CO2: 26 mmol/L (ref 22–32)
Calcium: 8.4 mg/dL — ABNORMAL LOW (ref 8.9–10.3)
Chloride: 97 mmol/L — ABNORMAL LOW (ref 98–111)
Creatinine, Ser: 0.86 mg/dL (ref 0.44–1.00)
GFR calc Af Amer: >60 mL/min (ref >60)
GFR calc non Af Amer: >60 mL/min (ref >60)
GLUCOSE: 224 mg/dL — AB (ref 70–99)
Potassium: 3.9 mmol/L (ref 3.5–5.1)
Sodium: 133 mmol/L — ABNORMAL LOW (ref 135–145)
TOTAL PROTEIN: 8 g/dL (ref 6.5–8.1)
Total Bilirubin: 0.6 mg/dL (ref 0.3–1.2)

## 2017-07-08 LAB — GLUCOSE, CAPILLARY: Glucose-Capillary: 247 mg/dL — ABNORMAL HIGH (ref 70–99)

## 2017-07-08 LAB — CBC
HEMATOCRIT: 37.7 % (ref 36.0–46.0)
HEMOGLOBIN: 12 g/dL (ref 12.0–15.0)
MCH: 28.1 pg (ref 26.0–34.0)
MCHC: 31.8 g/dL (ref 30.0–36.0)
MCV: 88.3 fL (ref 78.0–100.0)
Platelets: 232 10*3/uL (ref 150–400)
RBC: 4.27 MIL/uL (ref 3.87–5.11)
RDW: 14.3 % (ref 11.5–15.5)
WBC: 14 10*3/uL — AB (ref 4.0–10.5)

## 2017-07-08 MED ORDER — OXYCODONE-ACETAMINOPHEN 5-325 MG PO TABS: 1.0000 | ORAL_TABLET | ORAL | 0 refills | Status: AC | PRN

## 2017-07-08 MED ORDER — ONDANSETRON 4 MG PO TBDP: 4.0000 mg | ORAL_TABLET | Freq: Four times a day (QID) | ORAL | 0 refills | Status: AC | PRN

## 2017-07-08 MED ORDER — IBUPROFEN 800 MG PO TABS: 800.0000 mg | ORAL_TABLET | Freq: Three times a day (TID) | ORAL | 3 refills | Status: AC | PRN

## 2017-07-08 NOTE — Discharge Summary (Signed)
Gynecology Physician Postoperative Discharge Summary  Patient ID: Nicole Hubbard MRN: [id number] DOB/AGE: [birth year] 52 y.o.  Admit Date: 07/07/2017 Discharge Date: 07/08/2017  Preoperative Diagnoses: Postmenopausal bleeding status post endometrial ablation many years ago  Procedures: HYSTERECTOMY VAGINAL WITH BILATERAL SALPINGECTOMY  Hospital Course:  Nicole Hubbard is a 52 y.o. G2P1011 admitted for scheduled surgery.  She underwent the procedures as mentioned above, her operation was uncomplicated. For further details about surgery, please refer to the operative report. Patient had an uncomplicated postoperative course. By time of discharge on POD#1, her pain was controlled on oral pain medications; she was ambulating, voiding without difficulty, tolerating regular diet and passing flatus. She was deemed stable for discharge to home.   Significant Labs: CBC Latest Ref Rng & Units 07/08/2017 06/22/2017 04/22/2017  WBC 4.0 - 10.5 K/uL 14.0(H) 5.9 6.9  Hemoglobin 12.0 - 15.0 g/dL 12.0 12.8 12.5  Hematocrit 36.0 - 46.0 % 37.7 38.9 39.4  Platelets 150 - 400 K/uL 232 231 225   CMP Latest Ref Rng & Units 07/08/2017 06/22/2017 04/22/2017  Glucose 70 - 99 mg/dL 224(H) 242(H) 202(H)  BUN 6 - 20 mg/dL '10 12 9  ' Creatinine 0.44 - 1.00 mg/dL 0.86 0.85 0.76  Sodium 135 - 145 mmol/L 133(L) 137 139  Potassium 3.5 - 5.1 mmol/L 3.9 3.9 3.6  Chloride 98 - 111 mmol/L 97(L) 102 101  CO2 22 - 32 mmol/L '26 27 27  ' Calcium 8.9 - 10.3 mg/dL 8.4(L) 9.2 9.3  Total Protein 6.5 - 8.1 g/dL 8.0 8.7(H) 7.9  Total Bilirubin 0.3 - 1.2 mg/dL 0.6 0.1(L) 0.5  Alkaline Phos 38 - 126 U/L 67 75 76  AST 15 - 41 U/L '15 18 16  ' ALT 0 - 44 U/L '13 17 14     ' Discharge Exam: Blood pressure 119/77, pulse (!) 104, temperature 98.9 F (37.2 C), resp. rate 16, height '5\' 8"'  (1.727 m), weight (!) 320 lb (145.2 kg), last menstrual period 02/03/2014, SpO2 100 %. General appearance: alert and no distress  Resp: clear to auscultation bilaterally   Cardio: regular rate and rhythm  GI: soft, non-tender; bowel sounds normal; no masses, no organomegaly.  Pelvic: scant blood on pad  Extremities: extremities normal, atraumatic, no cyanosis or edema and Homans sign is negative, no sign of DVT  Discharged Condition: Stable  Discharge disposition: 01-Home or Self Care   Discharge Instructions    Call MD for:  persistant nausea and vomiting   Complete by:  As directed    Call MD for:  severe uncontrolled pain   Complete by:  As directed    Call MD for:  temperature >100.4   Complete by:  As directed    Diet - low sodium heart healthy   Complete by:  As directed    Diet Carb Modified   Complete by:  As directed    Increase activity slowly   Complete by:  As directed    Lifting restrictions   Complete by:  As directed    AS PER HANDOUT   May walk up steps   Complete by:  As directed    Sexual Activity Restrictions   Complete by:  As directed    8 WEEKS     Allergies as of 07/08/2017      Reactions   Metformin And Related Nausea And Vomiting, Other (See Comments)    persistent vomiting      Medication List    STOP taking these medications   metoprolol tartrate 25  MG tablet Commonly known as:  LOPRESSOR     TAKE these medications   albuterol 108 (90 Base) MCG/ACT inhaler Commonly known as:  PROVENTIL HFA;VENTOLIN HFA Inhale 1-2 puffs into the lungs every 6 (six) hours as needed for wheezing or shortness of breath.   amLODipine 10 MG tablet Commonly known as:  NORVASC TAKE 1 TABLET (10 MG TOTAL) BY MOUTH DAILY.   blood glucose meter kit and supplies Kit Dispense based on patient and insurance preference. Use up to four times daily as directed. (FOR ICD-9 250.00, 250.01). Strips with 11 refills  Lancets with 11 refills What changed:    how much to take  how to take this  when to take this  additional instructions   citalopram 40 MG tablet Commonly known as:  CELEXA TAKE 1 TABLET BY MOUTH DAILY FOR  DEPRESSION   cyclobenzaprine 10 MG tablet Commonly known as:  FLEXERIL TAKE 1 TABLET BY MOUTH TWICE A DAY AS NEEDED FOR MUSCLE SPASMS   docusate sodium 100 MG capsule Commonly known as:  COLACE Take 1 capsule (100 mg total) by mouth 2 (two) times daily as needed for mild constipation or moderate constipation.   glimepiride 4 MG tablet Commonly known as:  AMARYL Take 2 tablets (8 mg total) by mouth daily before breakfast. What changed:  Another medication with the same name was removed. Continue taking this medication, and follow the directions you see here.   glucose blood test strip Commonly known as:  ACCU-CHEK AVIVA Use as instructed to check blood sugar up to two times daily.  DX  E11.9 What changed:    how much to take  how to take this  when to take this  additional instructions   hydrochlorothiazide 25 MG tablet Commonly known as:  HYDRODIURIL TAKE 1 TABLET (25 MG TOTAL) BY MOUTH DAILY. FOR HIGH BLOOD PRESSURE   ibuprofen 800 MG tablet Commonly known as:  ADVIL,MOTRIN Take 1 tablet (800 mg total) by mouth 3 (three) times daily with meals as needed for mild pain, moderate pain or cramping (mild pain). What changed:    medication strength  how much to take  when to take this  reasons to take this   losartan 100 MG tablet Commonly known as:  COZAAR TAKE 1 TABLET (100 MG TOTAL) BY MOUTH DAILY. FOR HIGH BLOOD PRESSURE   ondansetron 4 MG disintegrating tablet Commonly known as:  ZOFRAN ODT Take 1 tablet (4 mg total) by mouth every 6 (six) hours as needed for nausea.   oxyCODONE-acetaminophen 5-325 MG tablet Commonly known as:  PERCOCET/ROXICET Take 1 tablet by mouth every 4 (four) hours as needed for severe pain ((when tolerating fluids)). What changed:  reasons to take this   pravastatin 40 MG tablet Commonly known as:  PRAVACHOL TAKE 1 TABLET (40 MG TOTAL) BY MOUTH DAILY.      Future Appointments  Date Time Provider [place]  07/20/2017   3:30 PM Copland, Gay Filler, MD [facility]  08-02-2017  1:15 PM Florean Hoobler, Sallyanne Havers, MD [facility]     Signed:   Verita Schneiders, MD, [place] for [employer], [place]

## 2017-07-08 NOTE — Addendum Note (Signed)
Addended by: Lamar Blinks C on: 07/08/2017 08:20 PM   Modules accepted: Orders

## 2017-07-08 NOTE — Discharge Instructions (Signed)
Vaginal Hysterectomy, Care After °Refer to this sheet in the next few weeks. These instructions provide you with information about caring for yourself after your procedure. Your health care provider may also give you more specific instructions. Your treatment has been planned according to current medical practices, but problems sometimes occur. Call your health care provider if you have any problems or questions after your procedure. °What can I expect after the procedure? °After the procedure, it is common to have: °· Pain. °· Soreness and numbness in your incision areas. °· Vaginal bleeding and discharge. °· Constipation. °· Temporary problems emptying the bladder. °· Feelings of sadness or other emotions. ° °Follow these instructions at home: °Medicines °· Take over-the-counter and prescription medicines only as told by your health care provider. °· If you were prescribed an antibiotic medicine, take it as told by your health care provider. Do not stop taking the antibiotic even if you start to feel better. °· Do not drive or operate heavy machinery while taking prescription pain medicine. °Activity °· Return to your normal activities as told by your health care provider. Ask your health care provider what activities are safe for you. °· Get regular exercise as told by your health care provider. You may be told to take short walks every day and go farther each time. °· Do not lift anything that is heavier than 10 lb (4.5 kg). °General instructions ° °· Do not put anything in your vagina for 6 weeks after your surgery or as told by your health care provider. This includes tampons and douches. °· Do not have sex until your health care provider says you can. °· Do not take baths, swim, or use a hot tub until your health care provider approves. °· Drink enough fluid to keep your urine clear or pale yellow. °· Do not drive for 24 hours if you were given a sedative. °· Keep all follow-up visits as told by your health  care provider. This is important. °Contact a health care provider if: °· Your pain medicine is not helping. °· You have a fever. °· You have redness, swelling, or pain at your incision site. °· You have blood, pus, or a bad-smelling discharge from your vagina. °· You continue to have difficulty urinating. °Get help right away if: °· You have severe abdominal or back pain. °· You have heavy bleeding from your vagina. °· You have chest pain or shortness of breath. °This information is not intended to replace advice given to you by your health care provider. Make sure you discuss any questions you have with your health care provider. °Document Released: 04/23/2015 Document Revised: 06/07/2015 Document Reviewed: 01/14/2015 °Elsevier Interactive Patient Education © 2018 [employer] Inc. ° °

## 2017-07-08 NOTE — Anesthesia Postprocedure Evaluation (Signed)
Anesthesia Post Note  Patient: Nicole Hubbard  Procedure(s) Performed: HYSTERECTOMY VAGINAL WITH SALPINGECTOMY (Bilateral Vagina )     Patient location during evaluation: PACU Anesthesia Type: General Level of consciousness: awake and alert Pain management: pain level controlled Vital Signs Assessment: post-procedure vital signs reviewed and stable Respiratory status: spontaneous breathing, nonlabored ventilation, respiratory function stable and patient connected to nasal cannula oxygen Cardiovascular status: blood pressure returned to baseline and stable Postop Assessment: no apparent nausea or vomiting Anesthetic complications: no    Last Vitals:  Vitals:   07/08/17 0510 07/08/17 0818  BP: 119/77 107/70  Pulse: (!) 104 96  Resp: 16 20  Temp: 37.2 C 36.7 C  SpO2: 100% 94%    Last Pain:  Vitals:   07/08/17 0818  TempSrc: Oral  PainSc:                  Montez Hageman

## 2017-07-09 ENCOUNTER — Encounter: Payer: Self-pay | Admitting: Obstetrics & Gynecology

## 2017-07-09 DIAGNOSIS — R7689 Other specified abnormal immunological findings in serum: Secondary | ICD-10-CM | POA: Insufficient documentation

## 2017-07-09 DIAGNOSIS — R768 Other specified abnormal immunological findings in serum: Secondary | ICD-10-CM | POA: Insufficient documentation

## 2017-07-11 LAB — TYPE AND SCREEN
ABO/RH(D): A POS
Antibody Screen: POSITIVE
PT AG TYPE: NEGATIVE
UNIT DIVISION: 0
UNIT DIVISION: 0

## 2017-07-11 LAB — BPAM RBC
BLOOD PRODUCT EXPIRATION DATE: 201907182359
Blood Product Expiration Date: 201907192359
Unit Type and Rh: 600
Unit Type and Rh: 600

## 2017-07-18 NOTE — Progress Notes (Deleted)
[facility] [address], [place], [place] [address] [phone]  Date:  07/20/2017   Name:  Nicole Hubbard   DOB:  19-Oct-1965   MRN:  [id number]  PCP:  Darreld Mclean, MD    Chief Complaint: No chief complaint on file.   History of Present Illness:  Nicole Hubbard is a 52 y.o. very pleasant female patient who presents with the following:  She had a total hyst last month for PMB I saw her in May prior to her operation for poor control of DM and vision change:  Currently she is using Basiglar, glimeperide No hypoglycemia noted  She notes that she is overall feeling much better and having fewer sx of her DM.  Her only issue now is really her vision She is seeing her GYN at the end of this month.  She would like to get a hyst but this is not yet on the books She has not gone back to work as her RTW date is the 23rd- however as her operation is coming up they plan to keep her out until this is done Her vision is still not great- she thinks that her rx may need to be changed and she plans to see her eye care professional asap.  We had thought that her sx were due to hyperglycemia alone but this may not be the whole problem She a very hard time seeing her computer and also with mid to distance vision  She does wear bifocals  Her glucose is running 150- 200; she generally does a fasting am check   We increased her glimepiride to 6 mg not long ago basiglar - 40 units  She uses flexeril on occasion for her back  Lab Results  Component Value Date   HGBA1C 10.6 (H) 06/01/2017     Patient Active Problem List   Diagnosis Date Noted  . Red blood cell antibody positive 07/09/2017  . S/P total vaginal hysterectomy and bilateral salpingectomy 07/07/2017  . Obstructive sleep apnea 05/05/2016  . Chronic insomnia 05/05/2016  . DDD (degenerative disc disease), lumbar 03/19/2015  . MDD (major depressive disorder), recurrent  severe, without psychosis ([place]) 08/17/2013  . Morbid obesity ([place]) 06/30/2012  . Depression 01/26/2012  . Diabetes mellitus, type 2 ([place]) 01/26/2012  . HTN (hypertension) 01/26/2012    Past Medical History:  Diagnosis Date  . Abnormal Pap smear of cervix   . Anxiety   . Asthma   . Chest pain   . Complication of anesthesia    Pt stated "I gagged out after having ablation at [facility]"  . Depression   . Diabetes mellitus without complication (HCC)    Type 2  . Difficult intubation    glidescope, moderately difficult mask  . History of blood transfusion 03/2015   back surgery at [facility]  . Hyperlipidemia   . Hypertension   . Migraine   . Sleep apnea    does not wear CPAP  . SVD (spontaneous vaginal delivery)    x 1    Past Surgical History:  Procedure Laterality Date  . BACK SURGERY  2017   L5-S1 fusion  . HYSTEROSCOPY W/D&C N/A 04/29/2017   Procedure: DILATATION AND CURETTAGE /HYSTEROSCOPY WITH ULRASOUND GUIDANCE;  Surgeon: Osborne Oman, MD;  Location: [place];  Service: Gynecology;  Laterality: N/A;  . KNEE SURGERY Right    Torn meniscus  . TONSILLECTOMY    .  uterine ablation    . VAGINAL HYSTERECTOMY Bilateral 07/07/2017   Procedure: HYSTERECTOMY VAGINAL WITH SALPINGECTOMY;  Surgeon: Osborne Oman, MD;  Location: [facility];  Service: Gynecology;  Laterality: Bilateral;    Social History   Tobacco Use  . Smoking status: Former Smoker    Packs/day: 0.50    Years: 24.00    Pack years: 12.00    Types: Cigarettes    Last attempt to quit: 09/15/2008    Years since quitting: 8.8  . Smokeless tobacco: Never Used  Substance Use Topics  . Alcohol use: Yes    Alcohol/week: 1.2 oz    Types: 2 Glasses of wine per week  . Drug use: No    Family History  Problem Relation Age of Onset  . Cancer Father        urethea  . Diabetes Mother   . Hypertension Mother     Allergies  Allergen Reactions  . Metformin And Related Nausea And Vomiting  and Other (See Comments)     persistent vomiting    Medication list has been reviewed and updated.  Current Outpatient Medications on File Prior to Visit  Medication Sig Dispense Refill  . albuterol (PROVENTIL HFA;VENTOLIN HFA) 108 (90 Base) MCG/ACT inhaler Inhale 1-2 puffs into the lungs every 6 (six) hours as needed for wheezing or shortness of breath. 18 g 5  . amLODipine (NORVASC) 10 MG tablet TAKE 1 TABLET (10 MG TOTAL) BY MOUTH DAILY. 90 tablet 3  . blood glucose meter kit and supplies KIT Dispense based on patient and insurance preference. Use up to four times daily as directed. (FOR ICD-9 250.00, 250.01). Strips with 11 refills  Lancets with 11 refills (Patient taking differently: Inject 1 each into the skin daily. Dispense based on patient and insurance preference. Use up to four times daily as directed. (FOR ICD-9 250.00, 250.01). Strips with 11 refills  Lancets with 11 refills) 1 each 0  . citalopram (CELEXA) 40 MG tablet TAKE 1 TABLET BY MOUTH DAILY FOR DEPRESSION 90 tablet 3  . cyclobenzaprine (FLEXERIL) 10 MG tablet TAKE 1 TABLET BY MOUTH TWICE A DAY AS NEEDED FOR MUSCLE SPASMS (Patient not taking: Reported on 06/10/2017) 30 tablet 0  . docusate sodium (COLACE) 100 MG capsule Take 1 capsule (100 mg total) by mouth 2 (two) times daily as needed for mild constipation or moderate constipation. (Patient not taking: Reported on 06/10/2017) 30 capsule 2  . glimepiride (AMARYL) 4 MG tablet Take 2 tablets (8 mg total) by mouth daily before breakfast. 180 tablet 3  . glucose blood (ACCU-CHEK AVIVA) test strip Use as instructed to check blood sugar up to two times daily.  DX  E11.9 (Patient taking differently: 1 each by Other route daily. Use as instructed to check blood sugar up to two times daily.  DX  E11.9) 180 each 3  . hydrochlorothiazide (HYDRODIURIL) 25 MG tablet TAKE 1 TABLET (25 MG TOTAL) BY MOUTH DAILY. FOR HIGH BLOOD PRESSURE 90 tablet 2  . ibuprofen (ADVIL,MOTRIN) 800 MG tablet  Take 1 tablet (800 mg total) by mouth 3 (three) times daily with meals as needed for mild pain, moderate pain or cramping (mild pain). 60 tablet 3  . losartan (COZAAR) 100 MG tablet TAKE 1 TABLET (100 MG TOTAL) BY MOUTH DAILY. FOR HIGH BLOOD PRESSURE 90 tablet 3  . ondansetron (ZOFRAN ODT) 4 MG disintegrating tablet Take 1 tablet (4 mg total) by mouth every 6 (six) hours as needed for nausea. 20 tablet 0  .  oxyCODONE-acetaminophen (PERCOCET/ROXICET) 5-325 MG tablet Take 1 tablet by mouth every 4 (four) hours as needed for severe pain ((when tolerating fluids)). 40 tablet 0  . pravastatin (PRAVACHOL) 40 MG tablet TAKE 1 TABLET (40 MG TOTAL) BY MOUTH DAILY. 90 tablet 3   No current facility-administered medications on file prior to visit.     Review of Systems:  As per HPI- otherwise negative.   Physical Examination: There were no vitals filed for this visit. There were no vitals filed for this visit. There is no height or weight on file to calculate BMI. Ideal Body Weight:    GEN: WDWN, NAD, Non-toxic, A & O x 3 HEENT: Atraumatic, Normocephalic. Neck supple. No masses, No LAD. Ears and Nose: No external deformity. CV: RRR, No M/G/R. No JVD. No thrill. No extra heart sounds. PULM: CTA B, no wheezes, crackles, rhonchi. No retractions. No resp. distress. No accessory muscle use. ABD: S, NT, ND, +BS. No rebound. No HSM. EXTR: No c/c/e NEURO Normal gait.  PSYCH: Normally interactive. Conversant. Not depressed or anxious appearing.  Calm demeanor.    Assessment and Plan: ***  Signed Lamar Blinks, MD

## 2017-07-20 ENCOUNTER — Encounter: Payer: Self-pay | Admitting: Obstetrics & Gynecology

## 2017-07-20 ENCOUNTER — Ambulatory Visit: Payer: 59 | Admitting: Family Medicine

## 2017-07-21 ENCOUNTER — Emergency Department (HOSPITAL_COMMUNITY): Payer: 59

## 2017-07-21 ENCOUNTER — Inpatient Hospital Stay (HOSPITAL_COMMUNITY)
Admission: EM | Admit: 2017-07-21 | Discharge: 2017-08-13 | DRG: 208 | Disposition: E | Payer: 59 | Attending: Emergency Medicine | Admitting: Emergency Medicine

## 2017-07-21 ENCOUNTER — Inpatient Hospital Stay (HOSPITAL_COMMUNITY): Payer: 59

## 2017-07-21 ENCOUNTER — Encounter (HOSPITAL_COMMUNITY): Payer: Self-pay

## 2017-07-21 ENCOUNTER — Other Ambulatory Visit: Payer: Self-pay

## 2017-07-21 ENCOUNTER — Ambulatory Visit: Payer: Self-pay | Admitting: *Deleted

## 2017-07-21 DIAGNOSIS — G4733 Obstructive sleep apnea (adult) (pediatric): Secondary | ICD-10-CM | POA: Diagnosis present

## 2017-07-21 DIAGNOSIS — E1165 Type 2 diabetes mellitus with hyperglycemia: Secondary | ICD-10-CM | POA: Diagnosis present

## 2017-07-21 DIAGNOSIS — G43909 Migraine, unspecified, not intractable, without status migrainosus: Secondary | ICD-10-CM | POA: Diagnosis present

## 2017-07-21 DIAGNOSIS — I2699 Other pulmonary embolism without acute cor pulmonale: Secondary | ICD-10-CM | POA: Diagnosis present

## 2017-07-21 DIAGNOSIS — R079 Chest pain, unspecified: Secondary | ICD-10-CM

## 2017-07-21 DIAGNOSIS — N179 Acute kidney failure, unspecified: Secondary | ICD-10-CM

## 2017-07-21 DIAGNOSIS — R0602 Shortness of breath: Secondary | ICD-10-CM | POA: Diagnosis present

## 2017-07-21 DIAGNOSIS — Z8249 Family history of ischemic heart disease and other diseases of the circulatory system: Secondary | ICD-10-CM

## 2017-07-21 DIAGNOSIS — R570 Cardiogenic shock: Secondary | ICD-10-CM | POA: Diagnosis present

## 2017-07-21 DIAGNOSIS — R579 Shock, unspecified: Secondary | ICD-10-CM

## 2017-07-21 DIAGNOSIS — E872 Acidosis, unspecified: Secondary | ICD-10-CM

## 2017-07-21 DIAGNOSIS — R34 Anuria and oliguria: Secondary | ICD-10-CM | POA: Diagnosis present

## 2017-07-21 DIAGNOSIS — Z6841 Body Mass Index (BMI) 40.0 and over, adult: Secondary | ICD-10-CM

## 2017-07-21 DIAGNOSIS — Z01818 Encounter for other preprocedural examination: Secondary | ICD-10-CM

## 2017-07-21 DIAGNOSIS — Z7984 Long term (current) use of oral hypoglycemic drugs: Secondary | ICD-10-CM

## 2017-07-21 DIAGNOSIS — Z8059 Family history of malignant neoplasm of other urinary tract organ: Secondary | ICD-10-CM

## 2017-07-21 DIAGNOSIS — J9601 Acute respiratory failure with hypoxia: Secondary | ICD-10-CM | POA: Diagnosis not present

## 2017-07-21 DIAGNOSIS — Z888 Allergy status to other drugs, medicaments and biological substances status: Secondary | ICD-10-CM

## 2017-07-21 DIAGNOSIS — E785 Hyperlipidemia, unspecified: Secondary | ICD-10-CM | POA: Diagnosis present

## 2017-07-21 DIAGNOSIS — Z833 Family history of diabetes mellitus: Secondary | ICD-10-CM | POA: Diagnosis not present

## 2017-07-21 DIAGNOSIS — Z981 Arthrodesis status: Secondary | ICD-10-CM | POA: Diagnosis not present

## 2017-07-21 DIAGNOSIS — Z87891 Personal history of nicotine dependence: Secondary | ICD-10-CM | POA: Diagnosis not present

## 2017-07-21 DIAGNOSIS — J45909 Unspecified asthma, uncomplicated: Secondary | ICD-10-CM | POA: Diagnosis present

## 2017-07-21 DIAGNOSIS — F329 Major depressive disorder, single episode, unspecified: Secondary | ICD-10-CM | POA: Diagnosis present

## 2017-07-21 DIAGNOSIS — Z90710 Acquired absence of both cervix and uterus: Secondary | ICD-10-CM

## 2017-07-21 DIAGNOSIS — I10 Essential (primary) hypertension: Secondary | ICD-10-CM | POA: Diagnosis present

## 2017-07-21 LAB — ECHOCARDIOGRAM COMPLETE: Weight: 5120 oz

## 2017-07-21 LAB — I-STAT CHEM 8, ED
BUN: 31 mg/dL — ABNORMAL HIGH (ref 6–20)
CHLORIDE: 98 mmol/L (ref 98–111)
Calcium, Ion: 1.03 mmol/L — ABNORMAL LOW (ref 1.15–1.40)
Creatinine, Ser: 4.7 mg/dL — ABNORMAL HIGH (ref 0.44–1.00)
GLUCOSE: 287 mg/dL — AB (ref 70–99)
HCT: 36 % (ref 36.0–46.0)
Hemoglobin: 12.2 g/dL (ref 12.0–15.0)
POTASSIUM: 3.7 mmol/L (ref 3.5–5.1)
Sodium: 134 mmol/L — ABNORMAL LOW (ref 135–145)
TCO2: 17 mmol/L — ABNORMAL LOW (ref 22–32)

## 2017-07-21 LAB — CBC
HEMATOCRIT: 36.6 % (ref 36.0–46.0)
HEMATOCRIT: 31.8 % — AB (ref 36.0–46.0)
Hemoglobin: 11.2 g/dL — ABNORMAL LOW (ref 12.0–15.0)
Hemoglobin: 9.4 g/dL — ABNORMAL LOW (ref 12.0–15.0)
MCH: 27.7 pg (ref 26.0–34.0)
MCH: 27.3 pg (ref 26.0–34.0)
MCHC: 30.6 g/dL (ref 30.0–36.0)
MCHC: 29.6 g/dL — AB (ref 30.0–36.0)
MCV: 90.4 fL (ref 78.0–100.0)
MCV: 92.4 fL (ref 78.0–100.0)
Platelets: 180 10*3/uL (ref 150–400)
Platelets: 152 10*3/uL (ref 150–400)
RBC: 4.05 MIL/uL (ref 3.87–5.11)
RBC: 3.44 MIL/uL — ABNORMAL LOW (ref 3.87–5.11)
RDW: 13.9 % (ref 11.5–15.5)
RDW: 14.5 % (ref 11.5–15.5)
WBC: 6.4 10*3/uL (ref 4.0–10.5)
WBC: 1.5 10*3/uL — ABNORMAL LOW (ref 4.0–10.5)

## 2017-07-21 LAB — POCT I-STAT 3, ART BLOOD GAS (G3+)
Acid-base deficit: 11 mmol/L — ABNORMAL HIGH (ref 0.0–2.0)
Bicarbonate: 16.8 mmol/L — ABNORMAL LOW (ref 20.0–28.0)
O2 SAT: 92 %
PCO2 ART: 43.9 mmHg (ref 32.0–48.0)
PH ART: 7.192 — AB (ref 7.350–7.450)
Patient temperature: 99
TCO2: 18 mmol/L — AB (ref 22–32)
pO2, Arterial: 80 mmHg — ABNORMAL LOW (ref 83.0–108.0)

## 2017-07-21 LAB — BASIC METABOLIC PANEL
ANION GAP: 25 — AB (ref 5–15)
ANION GAP: 18 — AB (ref 5–15)
BUN: 28 mg/dL — ABNORMAL HIGH (ref 6–20)
BUN: 34 mg/dL — ABNORMAL HIGH (ref 6–20)
CALCIUM: 6.7 mg/dL — AB (ref 8.9–10.3)
CO2: 15 mmol/L — AB (ref 22–32)
CO2: 19 mmol/L — AB (ref 22–32)
CREATININE: 5.16 mg/dL — AB (ref 0.44–1.00)
Calcium: 8.6 mg/dL — ABNORMAL LOW (ref 8.9–10.3)
Chloride: 95 mmol/L — ABNORMAL LOW (ref 98–111)
Chloride: 98 mmol/L (ref 98–111)
Creatinine, Ser: 4.94 mg/dL — ABNORMAL HIGH (ref 0.44–1.00)
GFR calc Af Amer: 10 mL/min — ABNORMAL LOW (ref >60)
GFR calc non Af Amer: 9 mL/min — ABNORMAL LOW (ref >60)
GFR calc non Af Amer: 9 mL/min — ABNORMAL LOW (ref >60)
GFR, EST AFRICAN AMERICAN: 11 mL/min — AB (ref >60)
GLUCOSE: 261 mg/dL — AB (ref 70–99)
Glucose, Bld: 284 mg/dL — ABNORMAL HIGH (ref 70–99)
Potassium: 3.6 mmol/L (ref 3.5–5.1)
Potassium: 4 mmol/L (ref 3.5–5.1)
SODIUM: 135 mmol/L (ref 135–145)
Sodium: 135 mmol/L (ref 135–145)

## 2017-07-21 LAB — I-STAT ARTERIAL BLOOD GAS, ED
ACID-BASE DEFICIT: 14 mmol/L — AB (ref 0.0–2.0)
Acid-base deficit: 12 mmol/L — ABNORMAL HIGH (ref 0.0–2.0)
Bicarbonate: 14.3 mmol/L — ABNORMAL LOW (ref 20.0–28.0)
Bicarbonate: 14.5 mmol/L — ABNORMAL LOW (ref 20.0–28.0)
O2 SAT: 98 %
O2 Saturation: 96 %
PCO2 ART: 31.9 mmHg — AB (ref 32.0–48.0)
PH ART: 7.143 — AB (ref 7.350–7.450)
PO2 ART: 133 mmHg — AB (ref 83.0–108.0)
Patient temperature: 97.6
Patient temperature: 98.6
TCO2: 15 mmol/L — AB (ref 22–32)
TCO2: 16 mmol/L — AB (ref 22–32)
pCO2 arterial: 42.3 mmHg (ref 32.0–48.0)
pH, Arterial: 7.258 — ABNORMAL LOW (ref 7.350–7.450)
pO2, Arterial: 92 mmHg (ref 83.0–108.0)

## 2017-07-21 LAB — GLUCOSE, CAPILLARY
GLUCOSE-CAPILLARY: 172 mg/dL — AB (ref 70–99)
Glucose-Capillary: 249 mg/dL — ABNORMAL HIGH (ref 70–99)
Glucose-Capillary: 239 mg/dL — ABNORMAL HIGH (ref 70–99)

## 2017-07-21 LAB — I-STAT TROPONIN, ED: Troponin i, poc: 0.15 ng/mL (ref 0.00–0.08)

## 2017-07-21 LAB — RESPIRATORY PANEL BY PCR
ADENOVIRUS-RVPPCR: NOT DETECTED
Bordetella pertussis: NOT DETECTED
CHLAMYDOPHILA PNEUMONIAE-RVPPCR: NOT DETECTED
Coronavirus 229E: NOT DETECTED
Coronavirus HKU1: NOT DETECTED
Coronavirus NL63: NOT DETECTED
Coronavirus OC43: NOT DETECTED
INFLUENZA A-RVPPCR: NOT DETECTED
INFLUENZA B-RVPPCR: NOT DETECTED
MYCOPLASMA PNEUMONIAE-RVPPCR: NOT DETECTED
Metapneumovirus: NOT DETECTED
PARAINFLUENZA VIRUS 1-RVPPCR: NOT DETECTED
Parainfluenza Virus 2: NOT DETECTED
Parainfluenza Virus 3: NOT DETECTED
Parainfluenza Virus 4: NOT DETECTED
RESPIRATORY SYNCYTIAL VIRUS-RVPPCR: NOT DETECTED
RHINOVIRUS / ENTEROVIRUS - RVPPCR: NOT DETECTED

## 2017-07-21 LAB — PROCALCITONIN: Procalcitonin: >150 ng/mL

## 2017-07-21 LAB — BRAIN NATRIURETIC PEPTIDE: B Natriuretic Peptide: 1452.2 pg/mL — ABNORMAL HIGH (ref 0.0–100.0)

## 2017-07-21 LAB — TROPONIN I
TROPONIN I: 0.17 ng/mL — AB (ref <0.03)
TROPONIN I: 0.27 ng/mL — AB (ref <0.03)
Troponin I: 0.1 ng/mL (ref <0.03)

## 2017-07-21 LAB — I-STAT CG4 LACTIC ACID, ED: LACTIC ACID, VENOUS: 9.26 mmol/L — AB (ref 0.5–1.9)

## 2017-07-21 LAB — PROTIME-INR
INR: 1.9
PROTHROMBIN TIME: 21.7 s — AB (ref 11.4–15.2)

## 2017-07-21 LAB — LACTIC ACID, PLASMA
LACTIC ACID, VENOUS: 7.1 mmol/L — AB (ref 0.5–1.9)
LACTIC ACID, VENOUS: 6.5 mmol/L — AB (ref 0.5–1.9)

## 2017-07-21 LAB — CORTISOL: Cortisol, Plasma: 72.9 ug/dL

## 2017-07-21 LAB — STREP PNEUMONIAE URINARY ANTIGEN: Strep Pneumo Urinary Antigen: NEGATIVE

## 2017-07-21 LAB — APTT: aPTT: 57 seconds — ABNORMAL HIGH (ref 24–36)

## 2017-07-21 LAB — MRSA PCR SCREENING: MRSA BY PCR: POSITIVE — AB

## 2017-07-21 LAB — D-DIMER, QUANTITATIVE: D-Dimer, Quant: >20 ug/mL-FEU — ABNORMAL HIGH (ref 0.00–0.50)

## 2017-07-21 LAB — SEDIMENTATION RATE: Sed Rate: 84 mm/hr — ABNORMAL HIGH (ref 0–22)

## 2017-07-21 MED ORDER — FENTANYL BOLUS VIA INFUSION
50.0000 ug | INTRAVENOUS | Status: DC | PRN
  Administered 2017-07-21: 50 ug via INTRAVENOUS
  Filled 2017-07-21: qty 50

## 2017-07-21 MED ORDER — SODIUM CHLORIDE 0.9 % IV SOLN
250.0000 mL | Freq: Once | INTRAVENOUS | Status: AC
  Administered 2017-07-21: 250 mL via INTRAVENOUS

## 2017-07-21 MED ORDER — SODIUM BICARBONATE 8.4 % IV SOLN
INTRAVENOUS | Status: AC
  Administered 2017-07-21: 50 meq
  Filled 2017-07-21: qty 50

## 2017-07-21 MED ORDER — SODIUM CHLORIDE 0.9 % IV BOLUS
1000.0000 mL | Freq: Once | INTRAVENOUS | Status: AC
  Administered 2017-07-21: 1000 mL via INTRAVENOUS

## 2017-07-21 MED ORDER — PIPERACILLIN-TAZOBACTAM 3.375 G IVPB 30 MIN
3.3750 g | Freq: Once | INTRAVENOUS | Status: AC
  Administered 2017-07-21: 3.375 g via INTRAVENOUS
  Filled 2017-07-21: qty 50

## 2017-07-21 MED ORDER — SODIUM BICARBONATE 8.4 % IV SOLN
50.0000 meq | Freq: Once | INTRAVENOUS | Status: AC
  Administered 2017-07-21: 50 meq via INTRAVENOUS

## 2017-07-21 MED ORDER — MUPIROCIN 2 % EX OINT
1.0000 "application " | TOPICAL_OINTMENT | Freq: Two times a day (BID) | CUTANEOUS | Status: DC
  Administered 2017-07-21: 1 via NASAL
  Filled 2017-07-21: qty 22

## 2017-07-21 MED ORDER — HYDROCORTISONE NA SUCCINATE PF 100 MG IJ SOLR
50.0000 mg | Freq: Three times a day (TID) | INTRAMUSCULAR | Status: DC
  Administered 2017-07-21: 50 mg via INTRAVENOUS
  Filled 2017-07-21: qty 2

## 2017-07-21 MED ORDER — MIDAZOLAM HCL 2 MG/2ML IJ SOLN: 2.0000 mg | INTRAMUSCULAR | Status: DC | PRN

## 2017-07-21 MED ORDER — FENTANYL CITRATE (PF) 100 MCG/2ML IJ SOLN: 50.0000 ug | Freq: Once | INTRAMUSCULAR | Status: DC

## 2017-07-21 MED ORDER — SODIUM CHLORIDE 0.9 % IV SOLN: INTRAVENOUS | Status: DC | PRN

## 2017-07-21 MED ORDER — ALTEPLASE (PULMONARY EMBOLISM) INFUSION: 100.0000 mg | Freq: Once | INTRAVENOUS | Status: DC

## 2017-07-21 MED ORDER — SODIUM CHLORIDE 0.9 % IV SOLN
0.0100 [IU]/min | INTRAVENOUS | Status: DC
  Administered 2017-07-21: 0.03 [IU]/min via INTRAVENOUS
  Filled 2017-07-21: qty 2

## 2017-07-21 MED ORDER — SODIUM BICARBONATE 8.4 % IV SOLN
100.0000 meq | Freq: Once | INTRAVENOUS | Status: AC
  Administered 2017-07-21: 100 meq via INTRAVENOUS

## 2017-07-21 MED ORDER — DEXTROSE 5 % IV SOLN
INTRAVENOUS | Status: DC
  Administered 2017-07-21: 18:00:00 via INTRAVENOUS
  Filled 2017-07-21 (×2): qty 150

## 2017-07-21 MED ORDER — ETOMIDATE 2 MG/ML IV SOLN
INTRAVENOUS | Status: AC | PRN
  Administered 2017-07-21 (×2): 20 mg via INTRAVENOUS

## 2017-07-21 MED ORDER — IOPAMIDOL (ISOVUE-370) INJECTION 76%
INTRAVENOUS | Status: AC
  Filled 2017-07-21: qty 100

## 2017-07-21 MED ORDER — SODIUM CHLORIDE 0.9 % IV SOLN
0.0000 ug/min | INTRAVENOUS | Status: DC
  Administered 2017-07-21: 250 ug/min via INTRAVENOUS
  Administered 2017-07-21: 400 ug/min via INTRAVENOUS
  Administered 2017-07-21: 350 ug/min via INTRAVENOUS
  Filled 2017-07-21 (×3): qty 10

## 2017-07-21 MED ORDER — MIDAZOLAM HCL 2 MG/2ML IJ SOLN: 1.0000 mg | INTRAMUSCULAR | Status: DC | PRN

## 2017-07-21 MED ORDER — ALTEPLASE (PULMONARY EMBOLISM) INFUSION
100.0000 mg | Freq: Once | INTRAVENOUS | Status: AC
  Administered 2017-07-21: 100 mg via INTRAVENOUS
  Filled 2017-07-21: qty 100

## 2017-07-21 MED ORDER — PIPERACILLIN-TAZOBACTAM 3.375 G IVPB
3.3750 g | Freq: Three times a day (TID) | INTRAVENOUS | Status: DC
  Administered 2017-07-21: 3.375 g via INTRAVENOUS
  Filled 2017-07-21 (×2): qty 50

## 2017-07-21 MED ORDER — VANCOMYCIN HCL 10 G IV SOLR
2000.0000 mg | INTRAVENOUS | Status: DC
  Filled 2017-07-21: qty 2000

## 2017-07-21 MED ORDER — SODIUM BICARBONATE 8.4 % IV SOLN: 50.0000 meq | Freq: Once | INTRAVENOUS | Status: AC

## 2017-07-21 MED ORDER — PHENYLEPHRINE HCL-NACL 10-0.9 MG/250ML-% IV SOLN
0.0000 ug/min | INTRAVENOUS | Status: DC
  Administered 2017-07-21: 10 ug/min via INTRAVENOUS
  Filled 2017-07-21 (×2): qty 250

## 2017-07-21 MED ORDER — SODIUM CHLORIDE 0.9 % IV SOLN
250.0000 mL | INTRAVENOUS | Status: DC | PRN
  Administered 2017-07-21: 20 mL via INTRAVENOUS

## 2017-07-21 MED ORDER — DOCUSATE SODIUM 50 MG/5ML PO LIQD
100.0000 mg | Freq: Two times a day (BID) | ORAL | Status: DC | PRN
  Filled 2017-07-21: qty 10

## 2017-07-21 MED ORDER — FENTANYL 2500MCG IN NS 250ML (10MCG/ML) PREMIX INFUSION: 25.0000 ug/h | INTRAVENOUS | Status: DC

## 2017-07-21 MED ORDER — SODIUM BICARBONATE 8.4 % IV SOLN
INTRAVENOUS | Status: AC
  Filled 2017-07-21: qty 100

## 2017-07-21 MED ORDER — INSULIN ASPART 100 UNIT/ML ~~LOC~~ SOLN
2.0000 [IU] | SUBCUTANEOUS | Status: DC
  Administered 2017-07-21: 6 [IU] via SUBCUTANEOUS

## 2017-07-21 MED ORDER — NOREPINEPHRINE 4 MG/250ML-% IV SOLN
0.0000 ug/min | Freq: Once | INTRAVENOUS | Status: AC
Start: 2017-07-21 — End: 2017-07-21
  Administered 2017-07-21: 2 ug/min via INTRAVENOUS
  Filled 2017-07-21: qty 250

## 2017-07-21 MED ORDER — HEPARIN BOLUS VIA INFUSION
6500.0000 [IU] | Freq: Once | INTRAVENOUS | Status: AC
  Administered 2017-07-21: 6500 [IU] via INTRAVENOUS
  Filled 2017-07-21: qty 6500

## 2017-07-21 MED ORDER — FAMOTIDINE 40 MG/5ML PO SUSR
20.0000 mg | Freq: Every day | ORAL | Status: DC
  Administered 2017-07-21: 20 mg
  Filled 2017-07-21: qty 2.5

## 2017-07-21 MED ORDER — EPINEPHRINE PF 1 MG/ML IJ SOLN
0.5000 ug/min | INTRAVENOUS | Status: DC
  Administered 2017-07-21: 5 ug/min via INTRAVENOUS
  Filled 2017-07-21 (×2): qty 4

## 2017-07-21 MED ORDER — CHLORHEXIDINE GLUCONATE CLOTH 2 % EX PADS: 6.0000 | MEDICATED_PAD | Freq: Every day | CUTANEOUS | Status: DC

## 2017-07-21 MED ORDER — NOREPINEPHRINE 16 MG/250ML-% IV SOLN
0.0000 ug/min | INTRAVENOUS | Status: DC
  Administered 2017-07-21: 40 ug/min via INTRAVENOUS
  Filled 2017-07-21 (×2): qty 250

## 2017-07-21 MED ORDER — VANCOMYCIN HCL 10 G IV SOLR
2500.0000 mg | Freq: Once | INTRAVENOUS | Status: AC
  Administered 2017-07-21: 2500 mg via INTRAVENOUS
  Filled 2017-07-21 (×2): qty 2500

## 2017-07-21 MED ORDER — SODIUM CHLORIDE 0.9 % IV SOLN: 250.0000 mL | Freq: Once | INTRAVENOUS | Status: AC

## 2017-07-21 MED ORDER — SODIUM BICARBONATE 8.4 % IV SOLN
INTRAVENOUS | Status: AC
  Filled 2017-07-21: qty 50

## 2017-07-21 MED ORDER — FENTANYL CITRATE (PF) 100 MCG/2ML IJ SOLN
INTRAMUSCULAR | Status: AC | PRN
  Administered 2017-07-21: 100 ug via INTRAVENOUS

## 2017-07-21 MED ORDER — DOCUSATE SODIUM 50 MG/5ML PO LIQD
100.0000 mg | Freq: Two times a day (BID) | ORAL | Status: DC
  Administered 2017-07-21: 100 mg
  Filled 2017-07-21: qty 10

## 2017-07-21 MED ORDER — FENTANYL BOLUS VIA INFUSION
50.0000 ug | INTRAVENOUS | Status: DC | PRN
  Filled 2017-07-21: qty 50

## 2017-07-21 MED ORDER — INSULIN ASPART 100 UNIT/ML ~~LOC~~ SOLN
0.0000 [IU] | SUBCUTANEOUS | Status: DC
  Administered 2017-07-21: 3 [IU] via SUBCUTANEOUS

## 2017-07-21 MED ORDER — ORAL CARE MOUTH RINSE: 15.0000 mL | OROMUCOSAL | Status: DC

## 2017-07-21 MED ORDER — SODIUM CHLORIDE 0.9 % IV BOLUS (SEPSIS)
1000.0000 mL | Freq: Once | INTRAVENOUS | Status: AC
  Administered 2017-07-21: 1000 mL via INTRAVENOUS

## 2017-07-21 MED ORDER — MIDAZOLAM HCL 2 MG/2ML IJ SOLN
INTRAMUSCULAR | Status: AC
  Filled 2017-07-21: qty 4

## 2017-07-21 MED ORDER — VANCOMYCIN HCL IN DEXTROSE 1-5 GM/200ML-% IV SOLN: 1000.0000 mg | Freq: Once | INTRAVENOUS | Status: DC

## 2017-07-21 MED ORDER — PHENYLEPHRINE HCL-NACL 40-0.9 MG/250ML-% IV SOLN
0.0000 ug/min | INTRAVENOUS | Status: DC
  Administered 2017-07-21: 400 ug/min via INTRAVENOUS
  Filled 2017-07-21 (×3): qty 250

## 2017-07-21 MED ORDER — NOREPINEPHRINE 4 MG/250ML-% IV SOLN
0.0000 ug/min | INTRAVENOUS | Status: DC
  Administered 2017-07-21: 5.333 ug/min via INTRAVENOUS
  Filled 2017-07-21: qty 250

## 2017-07-21 MED ORDER — SODIUM BICARBONATE 8.4 % IV SOLN
INTRAVENOUS | Status: DC
  Administered 2017-07-21: 20:00:00 via INTRAVENOUS
  Filled 2017-07-21 (×3): qty 850

## 2017-07-21 MED ORDER — ONDANSETRON HCL 4 MG/2ML IJ SOLN: 4.0000 mg | Freq: Four times a day (QID) | INTRAMUSCULAR | Status: DC | PRN

## 2017-07-21 MED ORDER — HEPARIN (PORCINE) IN NACL 100-0.45 UNIT/ML-% IJ SOLN
1600.0000 [IU]/h | INTRAMUSCULAR | Status: DC
  Administered 2017-07-21: 1600 [IU]/h via INTRAVENOUS
  Filled 2017-07-21: qty 250

## 2017-07-21 MED ORDER — CHLORHEXIDINE GLUCONATE 0.12% ORAL RINSE (MEDLINE KIT)
15.0000 mL | Freq: Two times a day (BID) | OROMUCOSAL | Status: DC
  Administered 2017-07-21: 15 mL via OROMUCOSAL

## 2017-07-21 MED ORDER — MIDAZOLAM HCL 2 MG/2ML IJ SOLN
2.0000 mg | INTRAMUSCULAR | Status: DC | PRN
  Administered 2017-07-21: 2 mg via INTRAVENOUS

## 2017-07-21 MED ORDER — FENTANYL 2500MCG IN NS 250ML (10MCG/ML) PREMIX INFUSION
25.0000 ug/h | INTRAVENOUS | Status: DC
  Administered 2017-07-21: 50 ug/h via INTRAVENOUS
  Filled 2017-07-21: qty 250

## 2017-07-21 MED ORDER — MIDAZOLAM HCL 2 MG/2ML IJ SOLN
INTRAMUSCULAR | Status: AC
  Filled 2017-07-21: qty 2

## 2017-07-21 MED ORDER — FAMOTIDINE 40 MG/5ML PO SUSR
20.0000 mg | Freq: Two times a day (BID) | ORAL | Status: DC
  Filled 2017-07-21: qty 2.5

## 2017-07-21 MED ORDER — FENTANYL CITRATE (PF) 100 MCG/2ML IJ SOLN
INTRAMUSCULAR | Status: AC
  Filled 2017-07-21: qty 2

## 2017-07-21 NOTE — ED Notes (Signed)
CCM at bedside 

## 2017-07-21 NOTE — Progress Notes (Signed)
Pupil change noted post beginning TPA infusion. Right pupil 40mm sluggish, Left pupil 56mm sluggish. Dr. Ashby Dawes notified at [place]. No new orders given. RN will continue to monitor per protocol.

## 2017-07-21 NOTE — ED Provider Notes (Signed)
[facility] EMERGENCY DEPARTMENT Provider Note   CSN: [id number] Arrival date & time: 07/15/2017  9562   LEVEL 5 CAVEAT - ACUITY OF SITUATION  History   Chief Complaint Chief Complaint  Patient presents with  . Chest Pain  . Shortness of Breath    HPI Nicole Hubbard is a 52 y.o. female.  HPI  52 year old female with a history of type 2 diabetes and a hysterectomy 2 weeks ago presents with shortness of breath.  She is been having shortness of breath, bilateral thigh pain, abdominal pain and midthoracic back pain.  All started about 2 days ago.  She arrives with hypotension and presumed hypoxia with cool extremities.  She states she is been feeling this bad for 2 days.  The rest of the history is limited as the patient is quite short of breath and is ill, thus limiting the history.  Past Medical History:  Diagnosis Date  . Abnormal Pap smear of cervix   . Anxiety   . Asthma   . Chest pain   . Complication of anesthesia    Pt stated "I gagged out after having ablation at [facility]"  . Depression   . Diabetes mellitus without complication (HCC)    Type 2  . Difficult intubation    glidescope, moderately difficult mask  . History of blood transfusion 03/2015   back surgery at [facility]  . Hyperlipidemia   . Hypertension   . Migraine   . Sleep apnea    does not wear CPAP  . SVD (spontaneous vaginal delivery)    x 1    Patient Active Problem List   Diagnosis Date Noted  . Red blood cell antibody positive 07/09/2017  . S/P total vaginal hysterectomy and bilateral salpingectomy 07/07/2017  . Obstructive sleep apnea 05/05/2016  . Chronic insomnia 05/05/2016  . DDD (degenerative disc disease), lumbar 03/19/2015  . MDD (major depressive disorder), recurrent severe, without psychosis ([place]) 08/17/2013  . Morbid obesity ([place]) 06/30/2012  . Depression 01/26/2012  . Diabetes mellitus, type 2 ([place]) 01/26/2012  . HTN (hypertension) 01/26/2012    Past Surgical  History:  Procedure Laterality Date  . BACK SURGERY  2017   L5-S1 fusion  . HYSTEROSCOPY W/D&C N/A 04/29/2017   Procedure: DILATATION AND CURETTAGE /HYSTEROSCOPY WITH ULRASOUND GUIDANCE;  Surgeon: Osborne Oman, MD;  Location: [facility];  Service: Gynecology;  Laterality: N/A;  . KNEE SURGERY Right    Torn meniscus  . TONSILLECTOMY    . uterine ablation    . VAGINAL HYSTERECTOMY Bilateral 07/07/2017   Procedure: HYSTERECTOMY VAGINAL WITH SALPINGECTOMY;  Surgeon: Osborne Oman, MD;  Location: [facility];  Service: Gynecology;  Laterality: Bilateral;     OB History    Gravida  2   Para  1   Term  1   Preterm      AB  1   Living  1     SAB  1   TAB      Ectopic      Multiple      Live Births               Home Medications    Prior to Admission medications   Medication Sig Start Date End Date Taking? Authorizing Provider  albuterol (PROVENTIL HFA;VENTOLIN HFA) 108 (90 Base) MCG/ACT inhaler Inhale 1-2 puffs into the lungs every 6 (six) hours as needed for wheezing or shortness of breath. 05/16/16   Copland, Gay Filler, MD  amLODipine (NORVASC) 10 MG tablet TAKE 1 TABLET (10 MG TOTAL) BY MOUTH DAILY. 07/09/16   Copland, Gay Filler, MD  blood glucose meter kit and supplies KIT Dispense based on patient and insurance preference. Use up to four times daily as directed. (FOR ICD-9 250.00, 250.01). Strips with 11 refills  Lancets with 11 refills Patient taking differently: Inject 1 each into the skin daily. Dispense based on patient and insurance preference. Use up to four times daily as directed. (FOR ICD-9 250.00, 250.01). Strips with 11 refills  Lancets with 11 refills 06/15/14   Shawnee Knapp, MD  citalopram (CELEXA) 40 MG tablet TAKE 1 TABLET BY MOUTH DAILY FOR DEPRESSION 07/09/16   Copland, Gay Filler, MD  cyclobenzaprine (FLEXERIL) 10 MG tablet TAKE 1 TABLET BY MOUTH TWICE A DAY AS NEEDED FOR MUSCLE SPASMS Patient not taking: Reported on 06/10/2017 05/04/17    Copland, Gay Filler, MD  docusate sodium (COLACE) 100 MG capsule Take 1 capsule (100 mg total) by mouth 2 (two) times daily as needed for mild constipation or moderate constipation. Patient not taking: Reported on 06/10/2017 04/29/17   Anyanwu, Sallyanne Havers, MD  glimepiride (AMARYL) 4 MG tablet Take 2 tablets (8 mg total) by mouth daily before breakfast. 06/01/17   Copland, Gay Filler, MD  glucose blood (ACCU-CHEK AVIVA) test strip Use as instructed to check blood sugar up to two times daily.  DX  E11.9 Patient taking differently: 1 each by Other route daily. Use as instructed to check blood sugar up to two times daily.  DX  E11.9 06/01/17   Copland, Gay Filler, MD  hydrochlorothiazide (HYDRODIURIL) 25 MG tablet TAKE 1 TABLET (25 MG TOTAL) BY MOUTH DAILY. FOR HIGH BLOOD PRESSURE 02/23/17   Copland, Gay Filler, MD  ibuprofen (ADVIL,MOTRIN) 800 MG tablet Take 1 tablet (800 mg total) by mouth 3 (three) times daily with meals as needed for mild pain, moderate pain or cramping (mild pain). 07/08/17   Anyanwu, Sallyanne Havers, MD  losartan (COZAAR) 100 MG tablet TAKE 1 TABLET (100 MG TOTAL) BY MOUTH DAILY. FOR HIGH BLOOD PRESSURE 07/09/16   Copland, Gay Filler, MD  ondansetron (ZOFRAN ODT) 4 MG disintegrating tablet Take 1 tablet (4 mg total) by mouth every 6 (six) hours as needed for nausea. 07/08/17   Anyanwu, Sallyanne Havers, MD  oxyCODONE-acetaminophen (PERCOCET/ROXICET) 5-325 MG tablet Take 1 tablet by mouth every 4 (four) hours as needed for severe pain ((when tolerating fluids)). 07/08/17   Anyanwu, Sallyanne Havers, MD  pravastatin (PRAVACHOL) 40 MG tablet TAKE 1 TABLET (40 MG TOTAL) BY MOUTH DAILY. 07/09/16   Copland, Gay Filler, MD    Family History Family History  Problem Relation Age of Onset  . Cancer Father        urethea  . Diabetes Mother   . Hypertension Mother     Social History Social History   Tobacco Use  . Smoking status: Former Smoker    Packs/day: 0.50    Years: 24.00    Pack years: 12.00    Types:  Cigarettes    Last attempt to quit: 09/15/2008    Years since quitting: 8.8  . Smokeless tobacco: Never Used  Substance Use Topics  . Alcohol use: Yes    Alcohol/week: 1.2 oz    Types: 2 Glasses of wine per week  . Drug use: No     Allergies   Metformin and related   Review of Systems Review of Systems  Unable to perform ROS: Acuity of condition  Physical Exam Updated Vital Signs BP (!) 78/58   Pulse (!) 133   Temp (!) 97.3 F (36.3 C) (Oral)   Resp (!) 34   Wt (!) 145.2 kg (320 lb)   LMP 02/03/2014   SpO2 98%   BMI 48.66 kg/m   Physical Exam  Constitutional: She is oriented to person, place, and time. She appears well-developed and well-nourished. She appears ill. She appears distressed.  obese  HENT:  Head: Normocephalic and atraumatic.  Right Ear: External ear normal.  Left Ear: External ear normal.  Nose: Nose normal.  Eyes: Right eye exhibits no discharge. Left eye exhibits no discharge.  Cardiovascular: Regular rhythm and normal heart sounds. Tachycardia present.  Pulmonary/Chest: Breath sounds normal. Accessory muscle usage present. Tachypnea noted. She has no wheezes. She has no rales.  Abdominal: Soft. There is tenderness (mild, diffuse).  Musculoskeletal:       Right lower leg: She exhibits no edema.       Left lower leg: She exhibits no edema.  Mild bilateral thigh tenderness  Neurological: She is alert and oriented to person, place, and time.  Skin: Skin is dry.  Cool extremities diffusely  Nursing note and vitals reviewed.    ED Treatments / Results  Labs (all labs ordered are listed, but only abnormal results are displayed) Labs Reviewed  BASIC METABOLIC PANEL - Abnormal; Notable for the following components:      Result Value   Chloride 95 (*)    CO2 15 (*)    Glucose, Bld 284 (*)    BUN 28 (*)    Creatinine, Ser 4.94 (*)    Calcium 8.6 (*)    GFR calc non Af Amer 9 (*)    GFR calc Af Amer 11 (*)    Anion gap 25 (*)    All other  components within normal limits  CBC - Abnormal; Notable for the following components:   Hemoglobin 11.2 (*)    All other components within normal limits  TROPONIN I - Abnormal; Notable for the following components:   Troponin I 0.10 (*)    All other components within normal limits  BRAIN NATRIURETIC PEPTIDE - Abnormal; Notable for the following components:   B Natriuretic Peptide 1,452.2 (*)    All other components within normal limits  D-DIMER, QUANTITATIVE (NOT AT [facility]) - Abnormal; Notable for the following components:   D-Dimer, Quant >20.00 (*)    All other components within normal limits  LACTIC ACID, PLASMA - Abnormal; Notable for the following components:   Lactic Acid, Venous 7.1 (*)    All other components within normal limits  I-STAT TROPONIN, ED - Abnormal; Notable for the following components:   Troponin i, poc 0.15 (*)    All other components within normal limits  I-STAT CHEM 8, ED - Abnormal; Notable for the following components:   Sodium 134 (*)    BUN 31 (*)    Creatinine, Ser 4.70 (*)    Glucose, Bld 287 (*)    Calcium, Ion 1.03 (*)    TCO2 17 (*)    All other components within normal limits  I-STAT CG4 LACTIC ACID, ED - Abnormal; Notable for the following components:   Lactic Acid, Venous 9.26 (*)    All other components within normal limits  I-STAT ARTERIAL BLOOD GAS, ED - Abnormal; Notable for the following components:   pH, Arterial 7.258 (*)    pCO2 arterial 31.9 (*)    Bicarbonate 14.3 (*)  TCO2 15 (*)    Acid-base deficit 12.0 (*)    All other components within normal limits  I-STAT ARTERIAL BLOOD GAS, ED - Abnormal; Notable for the following components:   pH, Arterial 7.143 (*)    pO2, Arterial 133.0 (*)    Bicarbonate 14.5 (*)    TCO2 16 (*)    Acid-base deficit 14.0 (*)    All other components within normal limits  CULTURE, BLOOD (ROUTINE X 2)  CULTURE, BLOOD (ROUTINE X 2)  CULTURE, BLOOD (ROUTINE X 2)  CULTURE, BLOOD (ROUTINE X 2)  URINE  CULTURE  CULTURE, RESPIRATORY (NON-EXPECTORATED)  RESPIRATORY PANEL BY PCR  MRSA PCR SCREENING  URINALYSIS, ROUTINE W REFLEX MICROSCOPIC  RAPID URINE DRUG SCREEN, HOSP PERFORMED  HIV ANTIBODY (ROUTINE TESTING)  LACTIC ACID, PLASMA  TROPONIN I  TROPONIN I  TROPONIN I  CORTISOL  URINALYSIS, ROUTINE W REFLEX MICROSCOPIC  STREP PNEUMONIAE URINARY ANTIGEN  LEGIONELLA PNEUMOPHILA SEROGP 1 UR AG  PROCALCITONIN  BLOOD GAS, ARTERIAL  ANCA TITERS  MPO/PR-3 (ANCA) ANTIBODIES  ANTINUCLEAR ANTIBODIES, IFA  SEDIMENTATION RATE  RHEUMATOID FACTOR  CBC  BASIC METABOLIC PANEL  BLOOD GAS, ARTERIAL  MAGNESIUM  PHOSPHORUS  PROCALCITONIN  BASIC METABOLIC PANEL  HEPARIN LEVEL (UNFRACTIONATED)  I-STAT CG4 LACTIC ACID, ED    EKG EKG Interpretation  Date/Time:  Tuesday July 21 2017 10:01:52 EDT Ventricular Rate:  141 PR Interval:    QRS Duration: 94 QT Interval:  313 QTC Calculation: 480 R Axis:   48 Text Interpretation:  Sinus tachycardia Low voltage, precordial leads Nonspecific T abnormalities, diffuse leads rate faster compared to April 2019 Confirmed by Sherwood Gambler [phone]) on 07/26/2017 11:25:56 AM   Radiology Ct Abdomen Pelvis Wo Contrast  Result Date: 08/01/2017 CLINICAL DATA:  Shortness of breath, chest, back and abdominal pain. EXAM: CT CHEST, ABDOMEN AND PELVIS WITHOUT CONTRAST TECHNIQUE: Multidetector CT imaging of the chest, abdomen and pelvis was performed following the standard protocol without IV contrast. COMPARISON:  None. FINDINGS: CT CHEST FINDINGS Cardiovascular: No significant vascular findings. Normal heart size. No pericardial effusion. Mediastinum/Nodes: No enlarged mediastinal, hilar, or axillary lymph nodes. Thyroid gland, trachea, and esophagus demonstrate no significant findings. Lungs/Pleura: No pleural effusion or pneumothorax. Bilateral scattered nodular airspace disease throughout bilateral lungs. Largest left upper lobe pulmonary nodule measures 14 mm.  Musculoskeletal: No acute osseous abnormality. No aggressive osseous lesion. CT ABDOMEN PELVIS FINDINGS Hepatobiliary: No focal liver abnormality is seen. No gallstones, gallbladder wall thickening, or biliary dilatation. Pancreas: Unremarkable. No pancreatic ductal dilatation or surrounding inflammatory changes. Spleen: Normal in size without focal abnormality. Adrenals/Urinary Tract: Adrenal glands are unremarkable. Kidneys are normal, without renal calculi, focal lesion, or hydronephrosis. Bladder is unremarkable. Stomach/Bowel: Stomach is within normal limits. Appendix appears normal. No evidence of bowel wall thickening, distention, or inflammatory changes. Diverticulosis without evidence of diverticulitis. Vascular/Lymphatic: No significant vascular findings are present. No enlarged abdominal or pelvic lymph nodes. Reproductive: Status post hysterectomy. No adnexal masses. Other: No abdominal wall hernia or abnormality. No abdominopelvic ascites. Musculoskeletal: No acute or significant osseous findings. 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 Posterior spinal fusion at L5-S1. IMPRESSION: 1. Bilateral diffuse nodular airspace disease new compared with 06/30/2017. Differential considerations include metastatic disease versus an infectious or inflammatory etiology including atypical infection such as fungal infection. Electronically Signed   By: Kathreen Devoid   On: 07/17/2017 12:09   Ct Chest Wo Contrast  Result Date: 08/10/2017 CLINICAL DATA:  Shortness of breath, chest, back and  abdominal pain. EXAM: CT CHEST, ABDOMEN AND PELVIS WITHOUT CONTRAST TECHNIQUE: Multidetector CT imaging of the chest, abdomen and pelvis was performed following the standard protocol without IV contrast. COMPARISON:  None. FINDINGS: CT CHEST FINDINGS Cardiovascular: No significant vascular findings. Normal heart size. No pericardial effusion. Mediastinum/Nodes: No enlarged mediastinal, hilar, or  axillary lymph nodes. Thyroid gland, trachea, and esophagus demonstrate no significant findings. Lungs/Pleura: No pleural effusion or pneumothorax. Bilateral scattered nodular airspace disease throughout bilateral lungs. Largest left upper lobe pulmonary nodule measures 14 mm. Musculoskeletal: No acute osseous abnormality. No aggressive osseous lesion. CT ABDOMEN PELVIS FINDINGS Hepatobiliary: No focal liver abnormality is seen. No gallstones, gallbladder wall thickening, or biliary dilatation. Pancreas: Unremarkable. No pancreatic ductal dilatation or surrounding inflammatory changes. Spleen: Normal in size without focal abnormality. Adrenals/Urinary Tract: Adrenal glands are unremarkable. Kidneys are normal, without renal calculi, focal lesion, or hydronephrosis. Bladder is unremarkable. Stomach/Bowel: Stomach is within normal limits. Appendix appears normal. No evidence of bowel wall thickening, distention, or inflammatory changes. Diverticulosis without evidence of diverticulitis. Vascular/Lymphatic: No significant vascular findings are present. No enlarged abdominal or pelvic lymph nodes. Reproductive: Status post hysterectomy. No adnexal masses. Other: No abdominal wall hernia or abnormality. No abdominopelvic ascites. Musculoskeletal: No acute or significant osseous findings. 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 Posterior spinal fusion at L5-S1. IMPRESSION: 1. Bilateral diffuse nodular airspace disease new compared with 06/30/2017. Differential considerations include metastatic disease versus an infectious or inflammatory etiology including atypical infection such as fungal infection. Electronically Signed   By: Kathreen Devoid   On: 07/27/2017 12:09   Dg Chest Port 1 View  Result Date: 07/29/2017 CLINICAL DATA:  Intubation and central line placement. EXAM: PORTABLE CHEST 1 VIEW COMPARISON:  Chest CT 08/05/2017 FINDINGS: The endotracheal tube is 2.2 cm above the carina. The  nasogastric tube appears to bend back upon itself at the side-port; the side port is about 4 cm proximal to the gastroesophageal junction and the end of the tube is oriented cephalad. Indistinct nodularity in the lungs as shown on recent chest CT. Mildly low lung volumes. Borderline enlargement of the cardiopericardial silhouette. IMPRESSION: 1. Endotracheal tube is satisfactorily position. 2. The nasogastric tube is bent back at its side port, with the tip oriented cephalad. One option would be to advance the tube at least 17 cm, which should bring the distal margin of the tube totally into the stomach and allow for the bend in the tube to correct itself. 3. Indistinct nodularity in the lungs as shown on recent CT. Electronically Signed   By: Van Clines M.D.   On: 07/20/2017 15:31   Dg Chest Portable 1 View  Result Date: 08/03/2017 CLINICAL DATA:  Shortness of breath, pain in the legs with swelling, cough EXAM: PORTABLE CHEST 1 VIEW COMPARISON:  Chest x-ray of 12/20/2013 FINDINGS: The lung bases are overlain by electrode leads. However there do appear to be somewhat prominent markings at the lung bases left-greater-than-right. This could be due to atelectasis but developing pneumonia cannot be excluded. Two-view chest x-ray with better inspiration and removal of the electrodes would be helpful. Mediastinal and hilar contours are unremarkable. The heart is borderline enlarged and stable. No bony abnormality is seen. IMPRESSION: 1. Suboptimal inspiration with somewhat prominent markings at both lung bases partially obscured as noted above. Cannot exclude atelectasis or developing pneumonia. 2. Stable borderline cardiomegaly. Electronically Signed   By: Ivar Drape M.D.   On: 07/18/2017 10:27    Procedures .Critical  Care Performed by: Sherwood Gambler, MD Authorized by: Sherwood Gambler, MD   Critical care provider statement:    Critical care time (minutes):  60   Critical care time was exclusive  of:  Separately billable procedures and treating other patients   Critical care was necessary to treat or prevent imminent or life-threatening deterioration of the following conditions:  Cardiac failure, circulatory failure, respiratory failure, shock and sepsis   Critical care was time spent personally by me on the following activities:  Development of treatment plan with patient or surrogate, discussions with consultants, evaluation of patient's response to treatment, examination of patient, obtaining history from patient or surrogate, ordering and performing treatments and interventions, ordering and review of laboratory studies, ordering and review of radiographic studies, pulse oximetry, re-evaluation of patient's condition and review of old charts   (including critical care time)  Medications Ordered in ED Medications  iopamidol (ISOVUE-370) 76 % injection (has no administration in time range)  piperacillin-tazobactam (ZOSYN) IVPB 3.375 g (has no administration in time range)  vancomycin (VANCOCIN) IVPB 1000 mg/200 mL premix (has no administration in time range)  sodium chloride 0.9 % bolus 1,000 mL (1,000 mLs Intravenous New Bag/Given 07/26/2017 1025)  sodium chloride 0.9 % bolus 1,000 mL (1,000 mLs Intravenous New Bag/Given 07/17/2017 1025)     Initial Impression / Assessment and Plan / ED Course  I have reviewed the triage vital signs and the nursing notes.  Pertinent labs & imaging results that were available during my care of the patient were reviewed by me and considered in my medical decision making (see chart for details).     The patient is critically ill on arrival.  She is noted to be hypotensive and tachycardic and likely hypoxic.  She shows signs of shock with all of these as well as hypoperfusion with cool extremities.  Started on IV fluids.  There is a concern for PE given the proximity of her recent hysterectomy.  However with her new renal failure with a creatinine of 4,  radiology will not do CT scan.  Emergent bedside Doppler showed no acute DVT.  Thus Noncon CT obtained to help rule out other pathology given the chest and abdominal pain.  Possible pneumonia although this looks atypical.  She will be started on Levophed as she is continued to be hypoxic and respiratory has placed an A-line.  ICU had been consulted as well and will place the central line and has decided intubate given her work of breathing.  She has refused nonrebreather given it causes a claustrophobia sensation.  She was given broad antibiotics as this could be infectious and without other clear etiology.  Pulmonary embolism is still in the differential but going to be hard to prove or disprove.  ICU has discussed possibly starting heparin but I do not think TPA is indicated.  Admit to the ICU in critical condition.  Final Clinical Impressions(s) / ED Diagnoses   Final diagnoses:  Shock ([place])  Lactic acidosis  Acute kidney injury ([place])  Acute respiratory failure with hypoxia [facility])    ED Discharge Orders    None       Sherwood Gambler, MD 07/27/2017 [phone]

## 2017-07-21 NOTE — Progress Notes (Signed)
Pharmacy Antibiotic Note  Nicole Hubbard is a 52 y.o. female admitted on 07/24/2017 with sepsis.  Pharmacy has been consulted for vancomycin and zosyn dosing.  Hysterectomy 2 weeks ago, presenting with SOB, tachycardic, hypotensive, afeb and WBC wnl.   SCr 4.7 (BL 0.8) CrCl ~ 21 mL/min  Plan: Zosyn 3.375 g IV every 8 hours Vancomycin 2500 mg x 1 dose, then 2000 mg IV every 48 hours Monitor renal function, clinical progression, Cx      Temp (24hrs), Avg:97.3 F (36.3 C), Min:97.3 F (36.3 C), Max:97.3 F (36.3 C)  Recent Labs  Lab 07/20/2017 1015 08/02/2017 1022  WBC 6.4  --   CREATININE  --  4.70*    Estimated Creatinine Clearance: 21.1 mL/min (A) (by C-G formula based on SCr of 4.7 mg/dL (H)).    Allergies  Allergen Reactions  . Metformin And Related Nausea And Vomiting and Other (See Comments)     persistent vomiting    Antimicrobials this admission: Vanc 7/9> Zosyn 7/9>  Dose adjustments this admission: n/a  Microbiology results: 7/9 BCx: sent   Carnella [place] 08/09/2017 11:18 AM

## 2017-07-21 NOTE — ED Notes (Signed)
Attempted to call report. Bed not ready at this time due to cleaning.

## 2017-07-21 NOTE — Progress Notes (Addendum)
I was called by the cardiologist in regards of stat Echo done which showed severe RV dysfunction and dilation concerning for PE. Patient is already on heparin drip.   I came to assess the patient bedside she is awake in severe distress. Patient is saturating in the 70s on 100% FIO2 and asynchronous with hyperventilation. BP 70/46 HR 140  I will start phenylephrine in addition to maxed levophed and vasopressin.   Patient might benefit from paralytics after getting her BP up and sedating her if  saturation does not improve with better perfusion. She is going for CTA chest

## 2017-07-21 NOTE — Procedures (Signed)
Arterial Catheter Insertion Procedure Note [name] [id number] [birth year]  Procedure: Insertion of Arterial Catheter  Indications: Blood pressure monitoring and Frequent blood sampling  Procedure Details Consent: Unable to obtain consent because of emergent medical necessity. Time Out: Verified patient identification, verified procedure, site/side was marked, verified correct patient position, special equipment/implants available, medications/allergies/relevent history reviewed, required imaging and test results available.  Performed  Maximum sterile technique was used including antiseptics, cap, gloves, gown, hand hygiene, mask and sheet. Skin prep: Chlorhexidine; local anesthetic administered 20 gauge catheter was inserted into right radial artery using the Seldinger technique. ULTRASOUND GUIDANCE USED: NO Evaluation Blood flow good; BP tracing good. Complications: No apparent complications. RT placed arrow catheter on first attempt.   Nicole Hubbard 07/20/2017

## 2017-07-21 NOTE — ED Triage Notes (Signed)
[facility]- pt coming from home with complaint of chest pain and shortness of breath X3 days. Pt had hysterectomy approximately 2 weeks ago. Central chest pain, non radiating. 324 ASA and 1 Nitro without relief. HR 140. BP 108 palpated.

## 2017-07-21 NOTE — ED Notes (Signed)
Pt transported to and from [place] with RN. Unable to preform CT due to patients kidney fxn. Pt transported back to room with RN. Pt remains anxious and tachypenic. EDP notified and at bedside.

## 2017-07-21 NOTE — Progress Notes (Signed)
ANTICOAGULATION CONSULT NOTE - Initial Consult  Pharmacy Consult for Heparin Indication: pulmonary embolus  Allergies  Allergen Reactions  . Metformin And Related Nausea And Vomiting and Other (See Comments)     persistent vomiting    Patient Measurements: Weight: (!) 320 lb (145.2 kg)  IBW 63.9 kg Heparin Dosing Weight: 99.4 kg  Vital Signs: Temp: 99.2 F (37.3 C) (07/09 1655) Temp Source: Oral (07/09 1655) BP: 89/53 (07/09 1512) Pulse Rate: 136 (07/09 1655)  Labs: Recent Labs    08/01/2017 1015 08/12/2017 1022  HGB 11.2* 12.2  HCT 36.6 36.0  PLT 180  --   CREATININE 4.94* 4.70*  TROPONINI 0.10*  --     Estimated Creatinine Clearance: 21.1 mL/min (A) (by C-G formula based on SCr of 4.7 mg/dL (H)).   Medical History: Past Medical History:  Diagnosis Date  . Abnormal Pap smear of cervix   . Anxiety   . Asthma   . Chest pain   . Complication of anesthesia    Pt stated "I gagged out after having ablation at [facility]"  . Depression   . Diabetes mellitus without complication (HCC)    Type 2  . Difficult intubation    glidescope, moderately difficult mask  . History of blood transfusion 03/2015   back surgery at [facility]  . Hyperlipidemia   . Hypertension   . Migraine   . Sleep apnea    does not wear CPAP  . SVD (spontaneous vaginal delivery)    x 1    Medications:  No anticoagulation prior to admission  Assessment: 52 year old female who presented to ED with chest pain and SOB s/p recent hysterectomy on 6/26. Concern for possible PE - unable to confirm with CT Angio 2/2 to AKI. LE Dopplers negative. Pharmacy consulted to start IV Heparin per pharmacy.  Patient currently requiring pressors 2/2 hypotension.   CBC is wnl. No bleeding noted. Troponin bumped.   Goal of Therapy:  Heparin level 0.3-0.7 units/ml Monitor platelets by anticoagulation protocol: Yes   Plan:  Heparin bolus of 6500 units IV x1  Start Heparin 1600 units/hr.  Heparin level in  8 hours.  Daily Heparin level and CBC.   Sloan Leiter, PharmD, BCPS, BCCCP Clinical Pharmacist Clinical phone 07/30/2017 until 11:[phone] After hours, please call [phone] 08/01/2017,5:12 PM

## 2017-07-21 NOTE — ED Notes (Signed)
Pt returned from [place] with RN. Pt remains anxious. Vitals unchanged at this time.

## 2017-07-21 NOTE — Progress Notes (Signed)
Patient continues to decompensate over the course of the day, now requiring 3 vasopressors and stress dose steroids, still with MAP in low 60's. Severe metabolic acidosis despite bicarb infusion. Aneuric AKI despite IVF's. Remains afebrile and no leukocytosis; no obvious source of infxn on workup thus far. Remains on empiric broad spectrum antibiotics. TTE shows severe RV strain with McConnell's sign, concerning for PE. Given the severe hemodynamic instability this would be considered a massive PE. Certainly her recent hysterectomy (vaginal approach) and BSO is a relative contraindication for lytics. But at this point we are running out of other options. Discussed benefits and risks of thrombolytics with patient's mother and daughter including risks of significant bleeding and death. Discussed alternatives including continuing our current plan although patient is clinically worsening with this plan. Patient is not stable enough to leave the ICU for a CTA Chest at this time, and do not think it would add much to our extremely high pretest probability for PE. Would also increase risk of worsening her AKI. Have ordered TPA per protocol. Family is agreeable. Monitor Hgb q4hrs following the infusion.   Other interventions: patient awake and appears uncomfortable on the vent. Increase fentanyl infusion; continue versed IV PRN. Patient is becoming hyperglycemic on the D5Bicarb infusion; change to Free water/Bicarb infusion instead. Increase SSI from sensitive to standard.    60 minutes critical care time  Vernie Murders, MD Pulmonary & Critical Care Medicine Pager: [phone]

## 2017-07-21 NOTE — ED Notes (Signed)
Fentanyl drip started at 1456.

## 2017-07-21 NOTE — Progress Notes (Signed)
  Echocardiogram 2D Echocardiogram has been performed.  Jannett Celestine 08/08/2017, 5:30 PM

## 2017-07-21 NOTE — Progress Notes (Signed)
RT called ABG results to DR Byrum at 16:01. Ph 7.14 PCO2 42.3, PAO2 133, HCO3 14.5

## 2017-07-21 NOTE — ED Notes (Signed)
Vascular Tech at bedside.  

## 2017-07-21 NOTE — H&P (Signed)
PULMONARY / CRITICAL CARE MEDICINE   Name: Nicole Hubbard MRN: [id number] DOB: [birth year]    ADMISSION DATE:  07/19/2017 CONSULTATION DATE:  07/17/2017  REFERRING MD:  Dr. Regenia Skeeter / EDP   CHIEF COMPLAINT:  Hypotension  HISTORY OF PRESENT ILLNESS:   52 y/o F, former smoker, with reported asthma, OSA who presented to [facility] ER on 7/9 via EMS with chest pain and shortness breath.    The patient recently had a hysterectomy 6/26 that was uneventful.  Of note, anesthesia graded her as a Mallampati III. She reports she had an uneventful postoperative course.  She reports some vaginal bleeding postoperatively but this has slowed.  She states on Saturday July 6 she began having shortness of breath, pain in her low back and pain in bilateral lower extremities making it difficult to walk.  She reports she initially thought it was gas pain and took a laxative but did not have a bowel movement.  She denies fevers, chills, cough or sputum production.  Patient also reported chest pain.  Initial ER work-up notable for hypotension with systolic in the 18E.  Labs- NA 135, K3.6, chloride 95, CO2 15, BUN 28, serum creatinine 4.94 (up from 0.86), glucose 284, lactic acid 9.26, WBC 6.4, IMA globin 11.2, and platelets 180.  Initial concern for possible PE however the patient was unable to have a CT angio due to acute kidney injury.  CT chest abdomen and pelvis obtained which revealed diffuse bilateral nodular airspace disease which was new compared to 06/30/2017.  PCCM consulted for evaluation.   PAST MEDICAL HISTORY :  She  has a past medical history of Abnormal Pap smear of cervix, Anxiety, Asthma, Chest pain, Complication of anesthesia, Depression, Diabetes mellitus without complication ([place]), Difficult intubation, History of blood transfusion (03/2015), Hyperlipidemia, Hypertension, Migraine, Sleep apnea, and SVD (spontaneous vaginal delivery).  PAST SURGICAL HISTORY: She  has a past surgical history that includes Knee  surgery (Right); Tonsillectomy; uterine ablation; Hysteroscopy w/D&C (N/A, 04/29/2017); Back surgery (2017); and Vaginal hysterectomy (Bilateral, 07/07/2017).  Allergies  Allergen Reactions  . Metformin And Related Nausea And Vomiting and Other (See Comments)     persistent vomiting    No current facility-administered medications on file prior to encounter.    Current Outpatient Medications on File Prior to Encounter  Medication Sig  . albuterol (PROVENTIL HFA;VENTOLIN HFA) 108 (90 Base) MCG/ACT inhaler Inhale 1-2 puffs into the lungs every 6 (six) hours as needed for wheezing or shortness of breath.  Marland Kitchen amLODipine (NORVASC) 10 MG tablet TAKE 1 TABLET (10 MG TOTAL) BY MOUTH DAILY.  . citalopram (CELEXA) 40 MG tablet TAKE 1 TABLET BY MOUTH DAILY FOR DEPRESSION (Patient taking differently: TAKE 1 TABLET(90m) BY MOUTH DAILY FOR DEPRESSION)  . cyclobenzaprine (FLEXERIL) 10 MG tablet TAKE 1 TABLET BY MOUTH TWICE A DAY AS NEEDED FOR MUSCLE SPASMS  . docusate sodium (COLACE) 100 MG capsule Take 1 capsule (100 mg total) by mouth 2 (two) times daily as needed for mild constipation or moderate constipation.  .Marland Kitchenglimepiride (AMARYL) 4 MG tablet Take 2 tablets (8 mg total) by mouth daily before breakfast.  . hydrochlorothiazide (HYDRODIURIL) 25 MG tablet TAKE 1 TABLET (25 MG TOTAL) BY MOUTH DAILY. FOR HIGH BLOOD PRESSURE  . ibuprofen (ADVIL,MOTRIN) 800 MG tablet Take 1 tablet (800 mg total) by mouth 3 (three) times daily with meals as needed for mild pain, moderate pain or cramping (mild pain).  .Marland Kitchenlosartan (COZAAR) 100 MG tablet TAKE 1 TABLET (100 MG TOTAL) BY  MOUTH DAILY. FOR HIGH BLOOD PRESSURE  . ondansetron (ZOFRAN ODT) 4 MG disintegrating tablet Take 1 tablet (4 mg total) by mouth every 6 (six) hours as needed for nausea.  Marland Kitchen oxyCODONE-acetaminophen (PERCOCET/ROXICET) 5-325 MG tablet Take 1 tablet by mouth every 4 (four) hours as needed for severe pain ((when tolerating fluids)).  . pravastatin  (PRAVACHOL) 40 MG tablet TAKE 1 TABLET (40 MG TOTAL) BY MOUTH DAILY.  . blood glucose meter kit and supplies KIT Dispense based on patient and insurance preference. Use up to four times daily as directed. (FOR ICD-9 250.00, 250.01). Strips with 11 refills  Lancets with 11 refills (Patient taking differently: Inject 1 each into the skin daily. Dispense based on patient and insurance preference. Use up to four times daily as directed. (FOR ICD-9 250.00, 250.01). Strips with 11 refills  Lancets with 11 refills)  . glucose blood (ACCU-CHEK AVIVA) test strip Use as instructed to check blood sugar up to two times daily.  DX  E11.9 (Patient taking differently: 1 each by Other route daily. Use as instructed to check blood sugar up to two times daily.  DX  E11.9)    FAMILY HISTORY:  Her family history includes Cancer in her father; Diabetes in her mother; Hypertension in her mother.  SOCIAL HISTORY: She  reports that she quit smoking about 8 years ago. Her smoking use included cigarettes. She has a 12.00 pack-year smoking history. She has never used smokeless tobacco. She reports that she drinks about 1.2 oz of alcohol per week. She reports that she does not use drugs.  REVIEW OF SYSTEMS:   Gen: Denies fever, chills, weight change, fatigue, night sweats HEENT: Denies blurred vision, double vision, hearing loss, tinnitus, sinus congestion, rhinorrhea, sore throat, neck stiffness, dysphagia PULM: Denies shortness of breath, cough, sputum production, hemoptysis, wheezing CV: Denies chest pain, edema, orthopnea, paroxysmal nocturnal dyspnea, palpitations GI: Denies abdominal pain, nausea, vomiting, diarrhea, hematochezia, melena, constipation, change in bowel habits GU: Denies dysuria, hematuria, polyuria, oliguria, urethral discharge.  Bloody vaginal discharge that has slowed / nearly cleared Endocrine: Denies hot or cold intolerance, polyuria, polyphagia or appetite change Derm: Denies rash, dry skin,  scaling or peeling skin change Heme: Denies easy bruising, bleeding, bleeding gums Neuro: Denies headache, numbness, weakness, slurred speech, loss of memory or consciousness. Low back pain, BLE pain   SUBJECTIVE:  As above   VITAL SIGNS: BP (!) 64/41 (BP Location: Right Wrist) Comment (BP Location): ALine  Pulse (!) 139   Temp (!) 97.3 F (36.3 C) (Oral)   Resp (!) 28   Wt (!) 320 lb (145.2 kg)   LMP 02/03/2014   SpO2 95%   BMI 48.66 kg/m   HEMODYNAMICS:    VENTILATOR SETTINGS:    INTAKE / OUTPUT: No intake/output data recorded.  PHYSICAL EXAMINATION: General:  Critically ill appearing adult female, sitting up in bed Neuro:  AAOx4, speech clear, MAE HEENT:  MM pink/dry Cardiovascular:  s1s2 rrr, tachy Lungs:  Diminished bilaterally, tachypnea Abdomen:  Obese/soft, bsx4 active Musculoskeletal:  No acute deformities  Skin:  Extremities cool/dry  LABS:  BMET Recent Labs  Lab 08/12/2017 1015 07/27/2017 1022  NA 135 134*  K 3.6 3.7  CL 95* 98  CO2 15*  --   BUN 28* 31*  CREATININE 4.94* 4.70*  GLUCOSE 284* 287*    Electrolytes Recent Labs  Lab 08/04/2017 1015  CALCIUM 8.6*    CBC Recent Labs  Lab 07/29/2017 1015 07/23/2017 1022  WBC 6.4  --  HGB 11.2* 12.2  HCT 36.6 36.0  PLT 180  --     Coag's No results for input(s): APTT, INR in the last 168 hours.  Sepsis Markers Recent Labs  Lab 07/14/2017 1206  LATICACIDVEN 9.26*    ABG Recent Labs  Lab 07/24/2017 1307  PHART 7.258*  PCO2ART 31.9*  PO2ART 92.0    Liver Enzymes No results for input(s): AST, ALT, ALKPHOS, BILITOT, ALBUMIN in the last 168 hours.  Cardiac Enzymes Recent Labs  Lab 07/16/2017 1015  TROPONINI 0.10*    Glucose No results for input(s): GLUCAP in the last 168 hours.  Imaging Ct Abdomen Pelvis Wo Contrast  Result Date: 07/17/2017 CLINICAL DATA:  Shortness of breath, chest, back and abdominal pain. EXAM: CT CHEST, ABDOMEN AND PELVIS WITHOUT CONTRAST TECHNIQUE:  Multidetector CT imaging of the chest, abdomen and pelvis was performed following the standard protocol without IV contrast. COMPARISON:  None. FINDINGS: CT CHEST FINDINGS Cardiovascular: No significant vascular findings. Normal heart size. No pericardial effusion. Mediastinum/Nodes: No enlarged mediastinal, hilar, or axillary lymph nodes. Thyroid gland, trachea, and esophagus demonstrate no significant findings. Lungs/Pleura: No pleural effusion or pneumothorax. Bilateral scattered nodular airspace disease throughout bilateral lungs. Largest left upper lobe pulmonary nodule measures 14 mm. Musculoskeletal: No acute osseous abnormality. No aggressive osseous lesion. CT ABDOMEN PELVIS FINDINGS Hepatobiliary: No focal liver abnormality is seen. No gallstones, gallbladder wall thickening, or biliary dilatation. Pancreas: Unremarkable. No pancreatic ductal dilatation or surrounding inflammatory changes. Spleen: Normal in size without focal abnormality. Adrenals/Urinary Tract: Adrenal glands are unremarkable. Kidneys are normal, without renal calculi, focal lesion, or hydronephrosis. Bladder is unremarkable. Stomach/Bowel: Stomach is within normal limits. Appendix appears normal. No evidence of bowel wall thickening, distention, or inflammatory changes. Diverticulosis without evidence of diverticulitis. Vascular/Lymphatic: No significant vascular findings are present. No enlarged abdominal or pelvic lymph nodes. Reproductive: Status post hysterectomy. No adnexal masses. Other: No abdominal wall hernia or abnormality. No abdominopelvic ascites. Musculoskeletal: No acute or significant osseous findings. 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 Posterior spinal fusion at L5-S1. IMPRESSION: 1. Bilateral diffuse nodular airspace disease new compared with 06/30/2017. Differential considerations include metastatic disease versus an infectious or inflammatory etiology including atypical infection  such as fungal infection. Electronically Signed   By: Kathreen Devoid   On: 07/23/2017 12:09   Ct Chest Wo Contrast  Result Date: 07/14/2017 CLINICAL DATA:  Shortness of breath, chest, back and abdominal pain. EXAM: CT CHEST, ABDOMEN AND PELVIS WITHOUT CONTRAST TECHNIQUE: Multidetector CT imaging of the chest, abdomen and pelvis was performed following the standard protocol without IV contrast. COMPARISON:  None. FINDINGS: CT CHEST FINDINGS Cardiovascular: No significant vascular findings. Normal heart size. No pericardial effusion. Mediastinum/Nodes: No enlarged mediastinal, hilar, or axillary lymph nodes. Thyroid gland, trachea, and esophagus demonstrate no significant findings. Lungs/Pleura: No pleural effusion or pneumothorax. Bilateral scattered nodular airspace disease throughout bilateral lungs. Largest left upper lobe pulmonary nodule measures 14 mm. Musculoskeletal: No acute osseous abnormality. No aggressive osseous lesion. CT ABDOMEN PELVIS FINDINGS Hepatobiliary: No focal liver abnormality is seen. No gallstones, gallbladder wall thickening, or biliary dilatation. Pancreas: Unremarkable. No pancreatic ductal dilatation or surrounding inflammatory changes. Spleen: Normal in size without focal abnormality. Adrenals/Urinary Tract: Adrenal glands are unremarkable. Kidneys are normal, without renal calculi, focal lesion, or hydronephrosis. Bladder is unremarkable. Stomach/Bowel: Stomach is within normal limits. Appendix appears normal. No evidence of bowel wall thickening, distention, or inflammatory changes. Diverticulosis without evidence of diverticulitis. Vascular/Lymphatic: No significant vascular findings are  present. No enlarged abdominal or pelvic lymph nodes. Reproductive: Status post hysterectomy. No adnexal masses. Other: No abdominal wall hernia or abnormality. No abdominopelvic ascites. Musculoskeletal: No acute or significant osseous findings. 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 Posterior spinal fusion at L5-S1. IMPRESSION: 1. Bilateral diffuse nodular airspace disease new compared with 06/30/2017. Differential considerations include metastatic disease versus an infectious or inflammatory etiology including atypical infection such as fungal infection. Electronically Signed   By: Kathreen Devoid   On: 08/10/2017 12:09   Dg Chest Portable 1 View  Result Date: 08/05/2017 CLINICAL DATA:  Shortness of breath, pain in the legs with swelling, cough EXAM: PORTABLE CHEST 1 VIEW COMPARISON:  Chest x-ray of 12/20/2013 FINDINGS: The lung bases are overlain by electrode leads. However there do appear to be somewhat prominent markings at the lung bases left-greater-than-right. This could be due to atelectasis but developing pneumonia cannot be excluded. Two-view chest x-ray with better inspiration and removal of the electrodes would be helpful. Mediastinal and hilar contours are unremarkable. The heart is borderline enlarged and stable. No bony abnormality is seen. IMPRESSION: 1. Suboptimal inspiration with somewhat prominent markings at both lung bases partially obscured as noted above. Cannot exclude atelectasis or developing pneumonia. 2. Stable borderline cardiomegaly. Electronically Signed   By: Ivar Drape M.D.   On: 07/19/2017 10:27     STUDIES:  CT Chest / ABD/ Pelvis >> revealed diffuse bilateral nodular airspace disease which was new compared to 06/30/2017.  No acute findings in abdomen. UDS 7/9 >> D-Dimer 7/9 >>   CULTURES: BCx2 7/9 >> UA 7/9 >>  UC 7/9 >>  RVP 7/9 >>  U. Strep 7/9 >>  U. Legionella 7/9 >>   ANTIBIOTICS: Vanco 7/9 >> Zosyn 7/9 >>   SIGNIFICANT EVENTS: 7/09  Admit with SOB, low back pain > shock, intubated   LINES/TUBES: ETT 7/9 >>  R Radial ALine 7/9 >>   DISCUSSION: 52 y/o F who presented to [facility] on 7[facility]9 with back pain, SOB, hypotension and bilateral infiltrates.  Recent admit for hysterectomy.    ASSESSMENT /  PLAN:  PULMONARY A: Acute Hypoxic Respiratory Failure  Nodular Infiltrates  Former Tobacco Abuse - quit 2004 P:   PRVC 8 cc/kg  Wean PEEP / FiO2 for sats >90% Now CXR & in am  ABG now and in am  PRN albuterol   CARDIOVASCULAR A:  Shock - suspected cardiogenic, r/o sepsis  Tachycardia  P:  STAT ECHO  Admit to ICU  Serial troponin, lactate, PCT Assess cortisol, D-Dimer  See ID  May need Cardiology consult pending ECHO results  Assess UDS   RENAL A:   Acute Kidney Injury Metabolic / Lactic Acidosis  P:   Trend lactate  Trend BMP / urinary output Replace electrolytes as indicated Avoid nephrotoxic agents, ensure adequate renal perfusion ANA, ANCA, RF, ESR  GASTROINTESTINAL / GU A:   Recent Vaginal Hysterectomy - cytology negative  P:   NPO / OGT  Consider TF in am 7/9 if remains intubated   HEMATOLOGIC A:   Mild Anemia  P:  Trend CBC Monitor for bleeding   INFECTIOUS A:   Rule Out Septic Shocki  P:   See Cardiac  ABX empirically as above  Follow cultures   ENDOCRINE A:   Hyperglycemia    P:   SSI, sensitive scale   NEUROLOGIC A:   Pain  P:   RASS goal: 0 to -1  Fentanyl gtt for  pain  PRN versed for sedation   FAMILY  - Updates: Mother updated per Dr. Lamonte Sakai   - Inter-disciplinary family meet or Palliative Care meeting due by:  07/28/17   Noe Gens, NP-C [place] Pgr: [phone] or if no answer [phone] 08/12/2017, 1:41 PM

## 2017-07-21 NOTE — Telephone Encounter (Signed)
Pt's mom called with daughter right there, with shortness of breath and pain in her chest, back , abd and legs. She has a hx of asthma. Hysterectomy in June. Had been doing fine until a couple of days ago. Not able to lay down and breath. Temp this morning per mom is 92, rechecked 91.8. Pt's breathing could be heard over the phone (could hear some distress in breathing.)  Shortness of breath is constant. Advised to go to ED at the closest hospital. Her mom will be taking her to [facility]. [facility] at [facility] notified.  Reason for Disposition . [1] MODERATE difficulty breathing (e.g., speaks in phrases, SOB even at rest, pulse 100-120) AND [2] NEW-onset or WORSE than normal  Answer Assessment - Initial Assessment Questions 1. RESPIRATORY STATUS: "Describe your breathing?" (e.g., wheezing, shortness of breath, unable to speak, severe coughing)      Shortness of breath 2. ONSET: "When did this breathing problem begin?"      A couple of days ago 3. PATTERN "Does the difficult breathing come and go, or has it been constant since it started?"      constant 4. SEVERITY: "How bad is your breathing?" (e.g., mild, moderate, severe)    - MILD: No SOB at rest, mild SOB with walking, speaks normally in sentences, can lay down, no retractions, pulse < 100.    - MODERATE: SOB at rest, SOB with minimal exertion and prefers to sit, cannot lie down flat, speaks in phrases, mild retractions, audible wheezing, pulse 100-120.    - SEVERE: Very SOB at rest, speaks in single words, struggling to breathe, sitting hunched forward, retractions, pulse > 120      Mild but not able to lay down and breath 5. RECURRENT SYMPTOM: "Have you had difficulty breathing before?" If so, ask: "When was the last time?" and "What happened that time?"      no 6. CARDIAC HISTORY: "Do you have any history of heart disease?" (e.g., heart attack, angina, bypass surgery, angioplasty)      no 7. LUNG HISTORY: "Do you have any  history of lung disease?"  (e.g., pulmonary embolus, asthma, emphysema)     asthma 8. CAUSE: "What do you think is causing the breathing problem?"      No  9. OTHER SYMPTOMS: "Do you have any other symptoms? (e.g., dizziness, runny nose, cough, chest pain, fever)     Chest pain  11. TRAVEL: "Have you traveled out of the country in the last month?" (e.g., travel history, exposures)       no  Protocols used: BREATHING DIFFICULTY-A-AH

## 2017-07-21 NOTE — Code Documentation (Signed)
ETT successfully placed by Dr. Lamonte Sakai.

## 2017-07-21 NOTE — ED Notes (Signed)
RT at bedside attempting to start arterial line.

## 2017-07-21 NOTE — ED Notes (Signed)
CRITICAL VALUE ALERT  Critical Value:  Lactic Acid 9.26  Date & Time Notied:  08/08/2017  Provider Notified: Dr. Regenia Skeeter  Orders Received/Actions taken: Continue fluids and monitor.

## 2017-07-21 NOTE — Procedures (Signed)
Intubation Procedure Note Nicole Hubbard [id number] 1965-11-14  Procedure: Intubation Indications: Respiratory insufficiency  Procedure Details Consent: Risks of procedure as well as the alternatives and risks of each were explained to the (patient/caregiver).  Consent for procedure obtained. Time Out: Verified patient identification, verified procedure, site/side was marked, verified correct patient position, special equipment/implants available, medications/allergies/relevent history reviewed, required imaging and test results available.  Performed  Maximum sterile technique was used including antiseptics, cap, gloves, gown, hand hygiene, mask and sheet.  MAC and 4    Evaluation Hemodynamic Status: BP stable throughout; O2 sats: stable throughout Patient's Current Condition: stable Complications: No apparent complications Patient did tolerate procedure well. Chest X-ray ordered to verify placement.  CXR: pending.  Patient intubated by Dr. Lamonte Sakai without significant complications.  Positive color change noted.  Condensation noted in ETT.  Bilateral breath sounds auscultated.  Chest xray is pending for tube placement.   Philomena Doheny 08/02/2017

## 2017-07-21 NOTE — Progress Notes (Signed)
RT note: RT transported patient from ED to 2M05. Patient vital signs stable at this time.

## 2017-07-21 NOTE — ED Notes (Signed)
Echo called to report the patient is ready for her exam.

## 2017-07-21 NOTE — Progress Notes (Signed)
LE venous duplex prelim: negative for DVT. Amyri Frenz Eunice, RDMS, RVT  

## 2017-07-21 NOTE — Procedures (Signed)
Intubation Procedure Note Nicole Hubbard [id number] January 22, 1965  Procedure: Intubation Indications: Respiratory insufficiency  Procedure Details Consent: Risks of procedure as well as the alternatives and risks of each were explained to the (patient/caregiver).  Consent for procedure obtained. Time Out: Verified patient identification, verified procedure, site/side was marked, verified correct patient position, special equipment/implants available, medications/allergies/relevent history reviewed, required imaging and test results available.  Performed  Maximum sterile technique was used including gloves, gown, hand hygiene and mask.   7.5 ET tube placed under direct visualization using MAC 4 glide scope.  Tube placed without difficulty on first pass.  Position confirmed by direct visualization, auscultation, end-tidal CO2 monitoring.  Evaluation Hemodynamic Status: BP stable throughout; O2 sats: stable throughout Patient's Current Condition: stable Complications: No apparent complications Patient did tolerate procedure well. Chest X-ray ordered to verify placement.  CXR: tube position acceptable.   Collene Gobble 07/25/2017

## 2017-07-21 NOTE — Progress Notes (Signed)
eLink Physician-Brief Progress Note Patient Name: Nicole Hubbard DOB: April 08, 1965 MRN: [id number]   Date of Service  07/13/2017  HPI/Events of Note  S/p TPA given empirically (not able to go for CT angio). Persistent and progressive hypotension. On max dose of levo, vaso, neo.   eICU Interventions  Bolus 1L of bicarb drip Stat hb Start epi drip.  Prognosis poor.         Laverle Hobby 08/12/2017, 10:52 PM

## 2017-07-21 NOTE — ED Notes (Signed)
Used doppler to manually obtain BP. BP noted to be 60 systolic via doppler. MD aware. Respiratory Therapist at bedside performing ABG.

## 2017-07-21 NOTE — ED Notes (Signed)
Vancomycin was never received from pharmacy. Called pharmacy, will send it to the ED.

## 2017-07-21 NOTE — Procedures (Signed)
Central Venous Catheter Insertion Procedure Note YARELIN REICHARDT [id number] Apr 25, 1965  Procedure: Insertion of Central Venous Catheter Indications: Drug and/or fluid administration  Procedure Details Consent: Risks of procedure as well as the alternatives and risks of each were explained to the (patient/caregiver).  Consent for procedure obtained. Time Out: Verified patient identification, verified procedure, site/side was marked, verified correct patient position, special equipment/implants available, medications/allergies/relevent history reviewed, required imaging and test results available.  Performed  Maximum sterile technique was used including antiseptics, cap, gloves, gown, hand hygiene, mask and sheet. Skin prep: Chlorhexidine; local anesthetic administered A antimicrobial bonded/coated triple lumen catheter was placed in the left internal jugular vein using the Seldinger technique.  Evaluation Blood flow good Complications: No apparent complications Patient did tolerate procedure well. Chest X-ray ordered to verify placement.  CXR: normal.  Collene Gobble 07/27/2017, 4:02 PM

## 2017-07-22 ENCOUNTER — Encounter: Payer: Self-pay | Admitting: Obstetrics & Gynecology

## 2017-07-22 ENCOUNTER — Telehealth: Payer: Self-pay | Admitting: Family Medicine

## 2017-07-22 ENCOUNTER — Ambulatory Visit: Payer: Self-pay | Admitting: Obstetrics & Gynecology

## 2017-07-22 LAB — BLOOD CULTURE ID PANEL (REFLEXED)
Acinetobacter baumannii: NOT DETECTED
CANDIDA GLABRATA: NOT DETECTED
CANDIDA KRUSEI: NOT DETECTED
CANDIDA PARAPSILOSIS: NOT DETECTED
Candida albicans: NOT DETECTED
Candida tropicalis: NOT DETECTED
ENTEROBACTER CLOACAE COMPLEX: NOT DETECTED
ESCHERICHIA COLI: NOT DETECTED
Enterobacteriaceae species: NOT DETECTED
Enterococcus species: NOT DETECTED
Haemophilus influenzae: NOT DETECTED
KLEBSIELLA OXYTOCA: NOT DETECTED
Klebsiella pneumoniae: NOT DETECTED
LISTERIA MONOCYTOGENES: NOT DETECTED
Methicillin resistance: DETECTED — AB
Neisseria meningitidis: NOT DETECTED
PROTEUS SPECIES: NOT DETECTED
Pseudomonas aeruginosa: NOT DETECTED
SERRATIA MARCESCENS: NOT DETECTED
STAPHYLOCOCCUS AUREUS BCID: DETECTED — AB
STAPHYLOCOCCUS SPECIES: DETECTED — AB
STREPTOCOCCUS AGALACTIAE: NOT DETECTED
STREPTOCOCCUS PNEUMONIAE: NOT DETECTED
Streptococcus pyogenes: NOT DETECTED
Streptococcus species: NOT DETECTED

## 2017-07-22 LAB — MPO/PR-3 (ANCA) ANTIBODIES: ANCA Proteinase 3: <3.5 U/mL (ref 0.0–3.5)

## 2017-07-22 LAB — POCT I-STAT 3, ART BLOOD GAS (G3+)
Acid-base deficit: 5 mmol/L — ABNORMAL HIGH (ref 0.0–2.0)
Bicarbonate: 23.8 mmol/L (ref 20.0–28.0)
O2 Saturation: 76 %
PCO2 ART: 65.5 mmHg — AB (ref 32.0–48.0)
PH ART: 7.168 — AB (ref 7.350–7.450)
TCO2: 26 mmol/L (ref 22–32)
pO2, Arterial: 52 mmHg — ABNORMAL LOW (ref 83.0–108.0)

## 2017-07-22 LAB — ANCA TITERS
C-ANCA: <1:20 {titer}
P-ANCA: <1:20 {titer}

## 2017-07-22 LAB — HIV ANTIBODY (ROUTINE TESTING W REFLEX): HIV Screen 4th Generation wRfx: NONREACTIVE

## 2017-07-22 LAB — RHEUMATOID FACTOR: Rhuematoid fact SerPl-aCnc: 22.2 IU/mL — ABNORMAL HIGH (ref 0.0–13.9)

## 2017-07-23 LAB — URINE CULTURE: Culture: NO GROWTH

## 2017-07-23 LAB — LEGIONELLA PNEUMOPHILA SEROGP 1 UR AG: L. pneumophila Serogp 1 Ur Ag: NEGATIVE

## 2017-07-23 LAB — ANTINUCLEAR ANTIBODIES, IFA: ANTINUCLEAR ANTIBODIES, IFA: NEGATIVE

## 2017-07-23 MED FILL — Medication: Qty: 1 | Status: AC

## 2017-07-24 LAB — CULTURE, BLOOD (ROUTINE X 2): SPECIAL REQUESTS: ADEQUATE

## 2017-07-27 ENCOUNTER — Telehealth: Payer: Self-pay

## 2017-07-27 NOTE — Telephone Encounter (Signed)
On 07/27/17 I received a d/c from [place] (original). The d/c is for cremation.  The patient is a patient of Doctor Hammonds.  The d/c will be taken to Pulmonary Unit for signature.  On 07/27/17 I received the d/c back from Doctor Halford Chessman who signed the d/c for Doctor Hammonds.  I got the d/c ready and called the funeral home to let them know the d/c is ready for pickup.

## 2017-08-13 ENCOUNTER — Encounter: Payer: Self-pay | Admitting: Obstetrics & Gynecology

## 2017-08-13 NOTE — Telephone Encounter (Signed)
Called and spoke with her mom Bettie- I was so sorry to see that Teresa had passed away.  She was a special person who I have known for a very long time. Expressed my condolences and that I will miss seeing Kyrah in the office - JC

## 2017-08-13 NOTE — Progress Notes (Signed)
150cc Fentanyl gtt wasted down sink. Witnessed by Jabier Gauss, RN.

## 2017-08-13 NOTE — Death Summary Note (Signed)
DEATH SUMMARY   Patient Details  Name: Nicole Hubbard MRN: [id number] DOB: Jan 15, 1965  Admission/Discharge Information   Admit Date:  07/26/17  Date of Death:  07/27/17   Time of Death:  12:07am  Length of Stay: 1  Referring Physician: Darreld Mclean, MD   Reason(s) for Hospitalization  Hypoxic respiratory failure, Shock, Massive PE  Diagnoses  Preliminary cause of death:  Secondary Diagnoses (including complications and co-morbidities):  Active Problems:   Shock [facility])   Brief Hospital Course (including significant findings, care, treatment, and services provided and events leading to death)  Nicole Hubbard is a 52 y.o. year old female with history of OSA, Asthma, and Recent hysterectomy with BSO for treatment of vaginal bleeding, presented to the hospital July 27, 2022 with back pain, thigh and leg pain, CP, and progressive SOB beginning 7/6. In the ER she was found to be in severe shock requiring initiation of vasopressors. She was pancultured and started on broad spectrum antibiotics. However, no clear evidence of infection was discovered on workup. She developed acute hypoxic respiratory failure requiring intubation. TTE showed severe right heart strain with McConnell's sign, highly suggestive of massive PE. She continued to decompensate over the course of the day despite broad spectrum antibiotics, IVF's, Bicarb infusion, and Multiple vasopressors. She was too unstable to leave the ICU for a CTA Chest, and she had severe AKI which precluded IV contrast anyway. Given her rapid decline, decision made to give IV TPA. Discussed risks and benefits with her family, who decided to proceed. Following TPA infusion patient's blood pressure continued to decline and her oxygenation didn't improve. More vasopressors added but patient continued to decline and then became unresponsive. Shortly thereafter she went into cardiac arrest. Unable to attain ROSC despite 30 minutes of CPR. Time of death 12:07am.     Pertinent Labs and Studies  Significant Diagnostic Studies Ct Abdomen Pelvis Wo Contrast  Result Date: 07-26-2017 CLINICAL DATA:  Shortness of breath, chest, back and abdominal pain. EXAM: CT CHEST, ABDOMEN AND PELVIS WITHOUT CONTRAST TECHNIQUE: Multidetector CT imaging of the chest, abdomen and pelvis was performed following the standard protocol without IV contrast. COMPARISON:  None. FINDINGS: CT CHEST FINDINGS Cardiovascular: No significant vascular findings. Normal heart size. No pericardial effusion. Mediastinum/Nodes: No enlarged mediastinal, hilar, or axillary lymph nodes. Thyroid gland, trachea, and esophagus demonstrate no significant findings. Lungs/Pleura: No pleural effusion or pneumothorax. Bilateral scattered nodular airspace disease throughout bilateral lungs. Largest left upper lobe pulmonary nodule measures 14 mm. Musculoskeletal: No acute osseous abnormality. No aggressive osseous lesion. CT ABDOMEN PELVIS FINDINGS Hepatobiliary: No focal liver abnormality is seen. No gallstones, gallbladder wall thickening, or biliary dilatation. Pancreas: Unremarkable. No pancreatic ductal dilatation or surrounding inflammatory changes. Spleen: Normal in size without focal abnormality. Adrenals/Urinary Tract: Adrenal glands are unremarkable. Kidneys are normal, without renal calculi, focal lesion, or hydronephrosis. Bladder is unremarkable. Stomach/Bowel: Stomach is within normal limits. Appendix appears normal. No evidence of bowel wall thickening, distention, or inflammatory changes. Diverticulosis without evidence of diverticulitis. Vascular/Lymphatic: No significant vascular findings are present. No enlarged abdominal or pelvic lymph nodes. Reproductive: Status post hysterectomy. No adnexal masses. Other: No abdominal wall hernia or abnormality. No abdominopelvic ascites. Musculoskeletal: No acute or significant osseous findings. 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  Posterior spinal fusion at L5-S1. IMPRESSION: 1. Bilateral diffuse nodular airspace disease new compared with 06/30/2017. Differential considerations include metastatic disease versus an infectious or inflammatory etiology including atypical infection such as fungal infection.  Electronically Signed   By: Kathreen Devoid   On: 07/17/2017 12:09   Ct Chest Wo Contrast  Result Date: 07/20/2017 CLINICAL DATA:  Shortness of breath, chest, back and abdominal pain. EXAM: CT CHEST, ABDOMEN AND PELVIS WITHOUT CONTRAST TECHNIQUE: Multidetector CT imaging of the chest, abdomen and pelvis was performed following the standard protocol without IV contrast. COMPARISON:  None. FINDINGS: CT CHEST FINDINGS Cardiovascular: No significant vascular findings. Normal heart size. No pericardial effusion. Mediastinum/Nodes: No enlarged mediastinal, hilar, or axillary lymph nodes. Thyroid gland, trachea, and esophagus demonstrate no significant findings. Lungs/Pleura: No pleural effusion or pneumothorax. Bilateral scattered nodular airspace disease throughout bilateral lungs. Largest left upper lobe pulmonary nodule measures 14 mm. Musculoskeletal: No acute osseous abnormality. No aggressive osseous lesion. CT ABDOMEN PELVIS FINDINGS Hepatobiliary: No focal liver abnormality is seen. No gallstones, gallbladder wall thickening, or biliary dilatation. Pancreas: Unremarkable. No pancreatic ductal dilatation or surrounding inflammatory changes. Spleen: Normal in size without focal abnormality. Adrenals/Urinary Tract: Adrenal glands are unremarkable. Kidneys are normal, without renal calculi, focal lesion, or hydronephrosis. Bladder is unremarkable. Stomach/Bowel: Stomach is within normal limits. Appendix appears normal. No evidence of bowel wall thickening, distention, or inflammatory changes. Diverticulosis without evidence of diverticulitis. Vascular/Lymphatic: No significant vascular findings are present. No enlarged abdominal or pelvic lymph  nodes. Reproductive: Status post hysterectomy. No adnexal masses. Other: No abdominal wall hernia or abnormality. No abdominopelvic ascites. Musculoskeletal: No acute or significant osseous findings. 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 Posterior spinal fusion at L5-S1. IMPRESSION: 1. Bilateral diffuse nodular airspace disease new compared with 06/30/2017. Differential considerations include metastatic disease versus an infectious or inflammatory etiology including atypical infection such as fungal infection. Electronically Signed   By: Kathreen Devoid   On: 07/14/2017 12:09   Ct Abdomen Pelvis W Contrast  Result Date: 06/30/2017 CLINICAL DATA:  Right upper quadrant abdominal pain, possible mass EXAM: CT ABDOMEN AND PELVIS WITH CONTRAST TECHNIQUE: Multidetector CT imaging of the abdomen and pelvis was performed using the standard protocol following bolus administration of intravenous contrast. CONTRAST:  121mL ISOVUE-300 IOPAMIDOL (ISOVUE-300) INJECTION 61% COMPARISON:  Limited ultrasound of the right upper quadrant of 02/23/2016 FINDINGS: Lower chest: The lung bases are clear. The heart is within normal limits in size. No pericardial effusion is seen. Hepatobiliary: The liver enhances with no focal abnormality and no ductal dilatation is seen. No calcified gallstones are noted within the gallbladder and the gallbladder wall does not appear to be thickened. Pancreas: The pancreas is normal in size in the pancreatic duct is visualized but not significantly dilated. Peripancreatic fat planes appear well preserved. Spleen: The spleen is unremarkable. Adrenals/Urinary Tract: The adrenal glands appear normal. The kidneys enhance with no mass and no calculus is noted. On delayed images, the pelvocaliceal systems are unremarkable and a small cyst is present in the upper pole of the right kidney anteriorly. The ureters appear normal in caliber. The urinary bladder is decompressed and  cannot be well evaluated. Stomach/Bowel: The stomach is moderately distended with fluid and oral contrast. No small bowel abnormality is noted. The colon is largely decompressed and there are a few scattered diverticula present. A small amount of feces is noted throughout the colon. The terminal ileum is unremarkable. The appendix is not seen but no inflammatory process is noted. Vascular/Lymphatic: Abdominal aorta is normal in caliber. 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 No adenopathy is seen. Reproductive: The uterus  is normal in size. No adnexal lesion is seen. No fluid is noted within the pelvis. Other: A lipoma is present within the right gluteus musculature. No abdominal wall hernia is seen. Musculoskeletal: The lumbar vertebrae are in normal alignment. Hardware for fusion at L5-S1 is noted. IMPRESSION: 1. No explanation for the patient's right upper quadrant pain is seen. No mass is evident and no calcified gallstones are seen. 2. No renal calculi are noted. 3. The terminal ileum is normal. The appendix is not definitely visualized. 4. 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 Electronically Signed   By: Ivar Drape M.D.   On: 06/30/2017 16:15   Dg Chest Port 1 View  Result Date: 07/20/2017 CLINICAL DATA:  Intubation and central line placement. EXAM: PORTABLE CHEST 1 VIEW COMPARISON:  Chest CT 08/08/2017 FINDINGS: The endotracheal tube is 2.2 cm above the carina. The nasogastric tube appears to bend back upon itself at the side-port; the side port is about 4 cm proximal to the gastroesophageal junction and the end of the tube is oriented cephalad. Indistinct nodularity in the lungs as shown on recent chest CT. Mildly low lung volumes. Borderline enlargement of the cardiopericardial silhouette. IMPRESSION: 1. Endotracheal tube is satisfactorily position. 2. The nasogastric tube is bent back at its side port, with  the tip oriented cephalad. One option would be to advance the tube at least 17 cm, which should bring the distal margin of the tube totally into the stomach and allow for the bend in the tube to correct itself. 3. Indistinct nodularity in the lungs as shown on recent CT. Electronically Signed   By: Van Clines M.D.   On: 07/31/2017 15:31   Dg Chest Portable 1 View  Result Date: 07/19/2017 CLINICAL DATA:  Shortness of breath, pain in the legs with swelling, cough EXAM: PORTABLE CHEST 1 VIEW COMPARISON:  Chest x-ray of 12/20/2013 FINDINGS: The lung bases are overlain by electrode leads. However there do appear to be somewhat prominent markings at the lung bases left-greater-than-right. This could be due to atelectasis but developing pneumonia cannot be excluded. Two-view chest x-ray with better inspiration and removal of the electrodes would be helpful. Mediastinal and hilar contours are unremarkable. The heart is borderline enlarged and stable. No bony abnormality is seen. IMPRESSION: 1. Suboptimal inspiration with somewhat prominent markings at both lung bases partially obscured as noted above. Cannot exclude atelectasis or developing pneumonia. 2. Stable borderline cardiomegaly. Electronically Signed   By: Ivar Drape M.D.   On: 08/04/2017 10:27    Microbiology Recent Results (from the past 240 hour(s))  Blood Culture (routine x 2)     Status: None (Preliminary result)   Collection Time: 07/14/2017 11:52 AM  Result Value Ref Range Status   Specimen Description BLOOD LEFT ANTECUBITAL  Final   Special Requests   Final    BOTTLES DRAWN AEROBIC AND ANAEROBIC Blood Culture adequate volume   Culture  Setup Time   Final    GRAM POSITIVE COCCI IN BOTH AEROBIC AND ANAEROBIC BOTTLES Organism ID to follow Performed at [facility], [place] [address] [place], [place] [address]    Culture PENDING  Incomplete   Report Status PENDING  Incomplete  MRSA PCR Screening     Status: Abnormal   Collection  Time: 07/21/17  4:59 PM  Result Value Ref Range Status   MRSA by PCR POSITIVE (A) NEGATIVE Final    Comment:  The GeneXpert MRSA Assay (FDA approved for NASAL specimens only), is one component of a comprehensive MRSA colonization surveillance program. It is not intended to diagnose MRSA infection nor to guide or monitor treatment for MRSA infections. RESULT CALLED TO, READ BACK BY AND VERIFIED WITH: Jessie Foot RN [id number] 08/11/2017 A BROWNING Performed at [facility], [place] [address], [place], [place] [address]   Respiratory Panel by PCR     Status: None   Collection Time: 08/07/2017  5:05 PM  Result Value Ref Range Status   Adenovirus NOT DETECTED NOT DETECTED Final   Coronavirus 229E NOT DETECTED NOT DETECTED Final   Coronavirus HKU1 NOT DETECTED NOT DETECTED Final   Coronavirus NL63 NOT DETECTED NOT DETECTED Final   Coronavirus OC43 NOT DETECTED NOT DETECTED Final   Metapneumovirus NOT DETECTED NOT DETECTED Final   Rhinovirus / Enterovirus NOT DETECTED NOT DETECTED Final   Influenza A NOT DETECTED NOT DETECTED Final   Influenza B NOT DETECTED NOT DETECTED Final   Parainfluenza Virus 1 NOT DETECTED NOT DETECTED Final   Parainfluenza Virus 2 NOT DETECTED NOT DETECTED Final   Parainfluenza Virus 3 NOT DETECTED NOT DETECTED Final   Parainfluenza Virus 4 NOT DETECTED NOT DETECTED Final   Respiratory Syncytial Virus NOT DETECTED NOT DETECTED Final   Bordetella pertussis NOT DETECTED NOT DETECTED Final   Chlamydophila pneumoniae NOT DETECTED NOT DETECTED Final   Mycoplasma pneumoniae NOT DETECTED NOT DETECTED Final    Comment: Performed at [facility], [place] [address] [place], [place] [address]   Lab Basic Metabolic Panel: Recent Labs  Lab 07/17/2017 1015 08/01/2017 1022 07/28/2017 2045  NA 135 134* 135  K 3.6 3.7 4.0  CL 95* 98 98  CO2 15*  --  19*  GLUCOSE 284* 287* 261*  BUN 28* 31* 34*  CREATININE 4.94* 4.70* 5.16*  CALCIUM 8.6*  --  6.7*   Liver Function  Tests: No results for input(s): AST, ALT, ALKPHOS, BILITOT, PROT, ALBUMIN in the last 168 hours. No results for input(s): LIPASE, AMYLASE in the last 168 hours. No results for input(s): AMMONIA in the last 168 hours. CBC: Recent Labs  Lab 08/06/2017 1015 08/08/2017 1022 08/09/2017 2255  WBC 6.4  --  1.5*  HGB 11.2* 12.2 9.4*  HCT 36.6 36.0 31.8*  MCV 90.4  --  92.4  PLT 180  --  152   Cardiac Enzymes: Recent Labs  Lab 08/11/2017 1015 07/24/2017 1535 07/24/2017 2045  TROPONINI 0.10* 0.17* 0.27*   Sepsis Labs: Recent Labs  Lab 07/18/2017 1015 07/16/2017 1206 08/12/2017 1416 08/10/2017 1535 08/12/2017 1730 07/21/17 2255  PROCALCITON  --   --   --  >150.00  --   --   WBC 6.4  --   --   --   --  1.5*  LATICACIDVEN  --  9.26* 7.1*  --  6.5*  --     Procedures/Operations  Intubation Central line placement Arterial line placement    Maryana Pittmon H Bowdy Bair 2017-08-16, 12:23 AM

## 2017-08-13 NOTE — Progress Notes (Signed)
Patient continued to become more hypotensive despite max dose of 4 vasopressors, stress dose steroids, bicarb infusion and pushes. Hgb checked and was stable at 9.4 post TPA. Abdomen remained soft and nondistended. No clinically obvious blood loss with exception of small amount of blood from ETT suction. Began bolusing Bicarb infusion. Despite this BP continued dropping. Patient became unresponsive and developed cardiac arrest. CPR performed for 30 minutes with multiple rounds of epinephrine and bicarb given. Despite this patient never regained a pulse. Bedside echo showed no tamponade but also no cardiac activity. Time of death 12:07am. Family present at bedside with chaplain.

## 2017-08-13 NOTE — Progress Notes (Signed)
08/03/2017 at 2245 TPA completed. On neuro check patient was noted to be unresponsive. Pupils sluggish, left 5mm, right 68mm. Blood pressure was noted to begin to drop. Elink MD Ashby Dawes paged. Levo gtt increased to 50 mcg. Sodium Bicarb gtt increase to 920ml/hr. Fentanyl stopped at 2307. Epi gtt started at 2308, maxed out at 20mg . NP Georgann Housekeeper paged to bedside due to blood pressure continuing to drop. 2 amps sodium bicarb given with slight blood pressure increase. PT/PTT and CBC labs drawn. MD Phylliss Bob called to bedside due to patient's declining status. Patient PEA arrested at 2337. Family called.   Code Blue run from 2337- 2017-07-24 at [facility]. Code called with TOD of 0007 on 07-24-2017. In total patient received 1 amp calcium gluconate, 9 amps Epi, 2 amps bicarb. Patient remained PEA the entire code.   Patient pronounced dead by MD Phylliss Bob at [phone].   Patients family visiting a bedside with chaplain.   No belongings at bedside.   ME Harris made aware of patient's death. ME case refused. Clarified by MD Phylliss Bob.   [facility] notified of patient's death. Patient suitable for eyes and tissues. Eye care will be completed by RN.

## 2017-08-13 DEATH — deceased

## 2017-08-18 ENCOUNTER — Ambulatory Visit: Payer: Self-pay | Admitting: Obstetrics & Gynecology

## 2017-09-09 ENCOUNTER — Institutional Professional Consult (permissible substitution): Payer: 59 | Admitting: Neurology

## 2019-09-16 IMAGING — US US ABDOMEN LIMITED
1 series · 14 of 25 positions shown · non-contrast
Comparison: None.

CLINICAL DATA: Superficial RIGHT upper quadrant mass. Mild upper
abdominal pain.

EXAM:
ULTRASOUND ABDOMEN LIMITED RIGHT UPPER QUADRANT

[Series 1: us abdomen limited · 0.21mm/px · 14 of 47 slices shown]
[im 1/47]
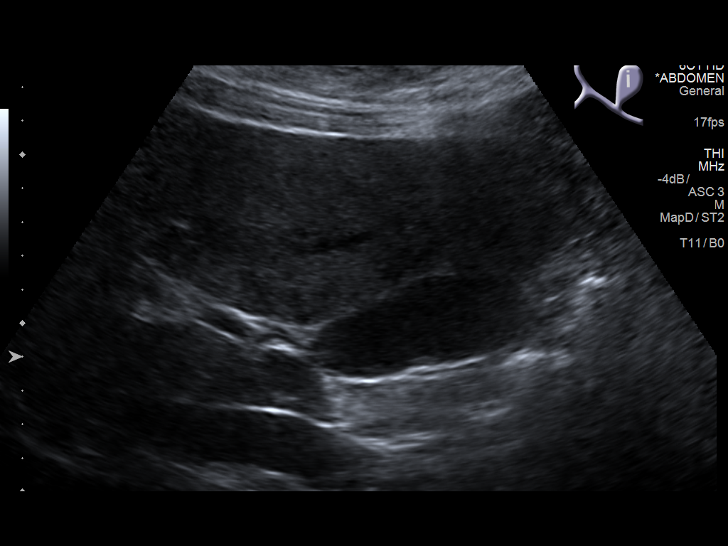
[im 4/47]
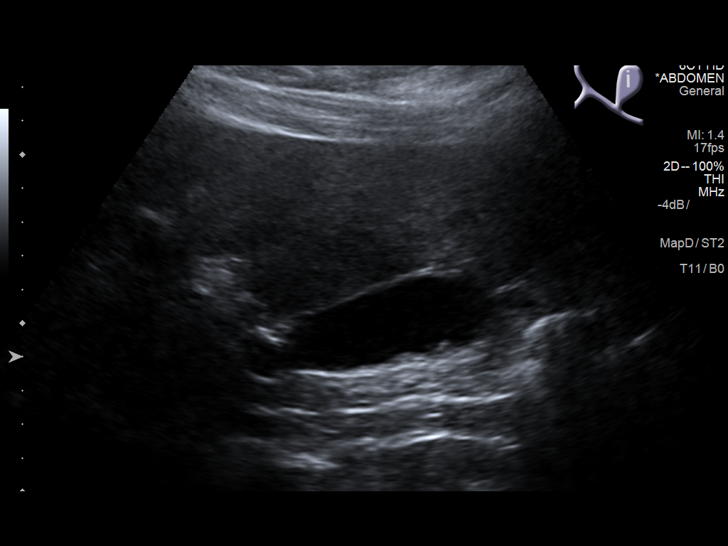
[im 8/47]
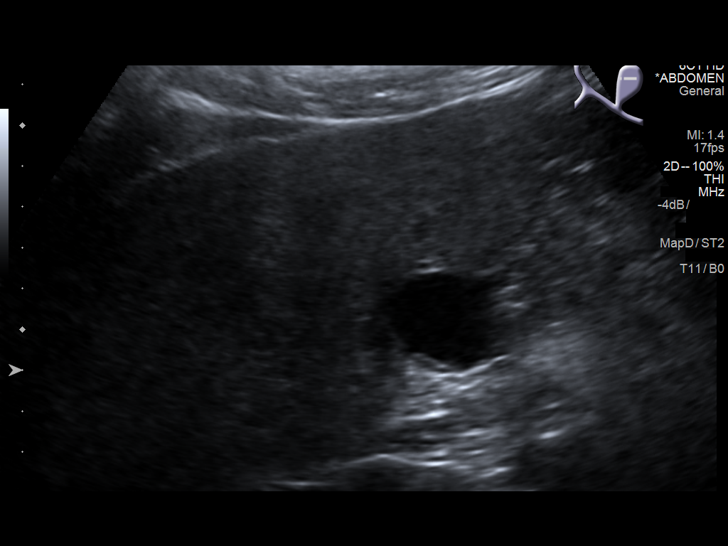
[im 12/47]
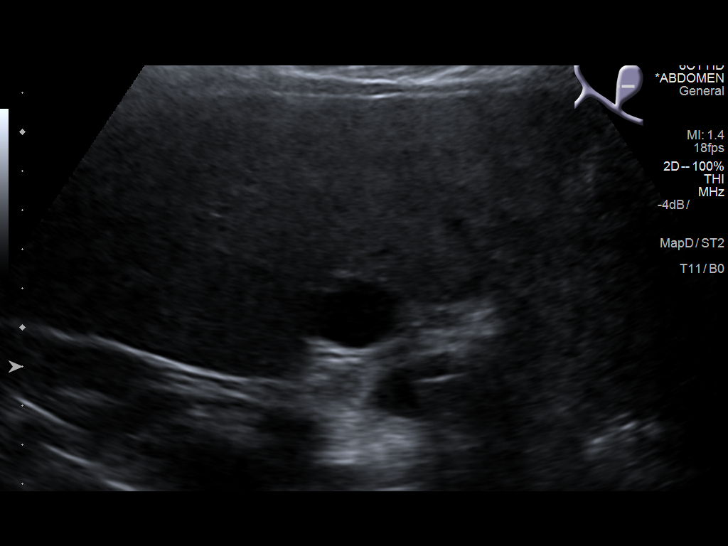
[im 16/47]
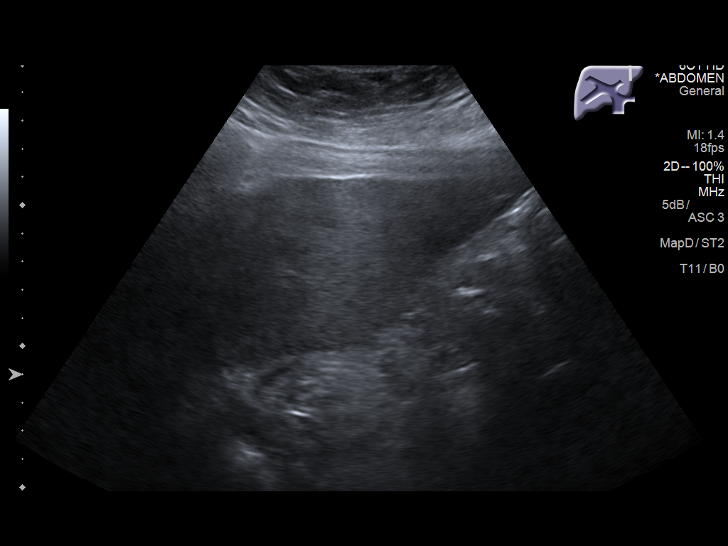
[im 18/47]
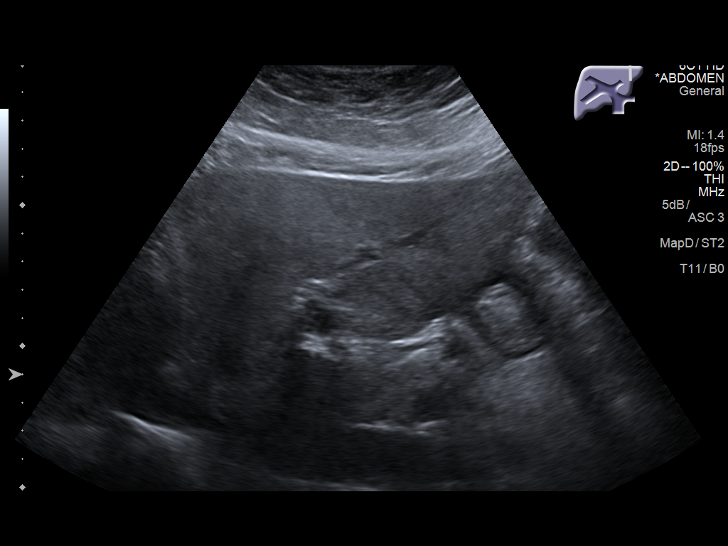
[im 22/47]
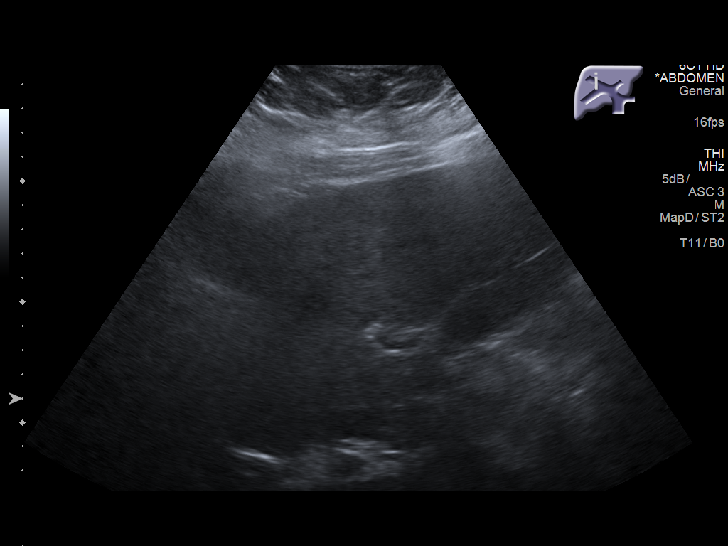
[im 25/47]
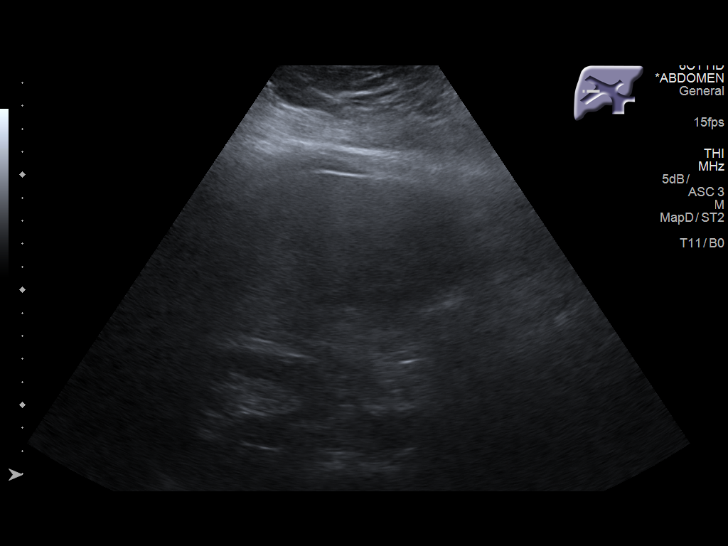
[im 29/47]
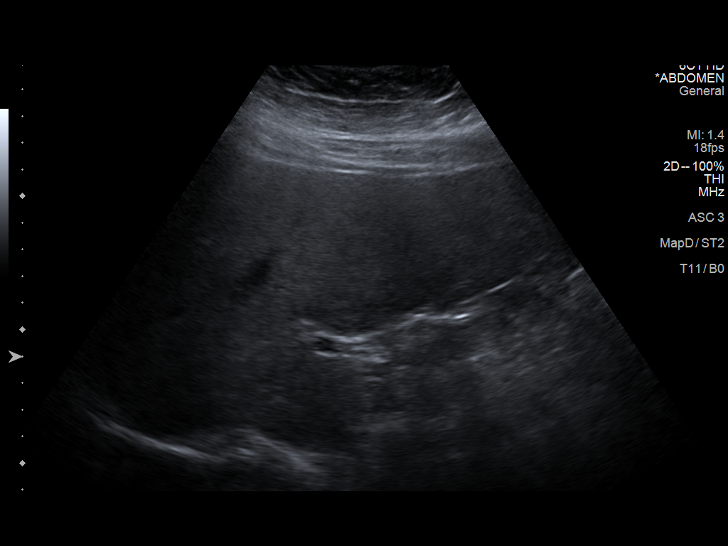
[im 31/47]
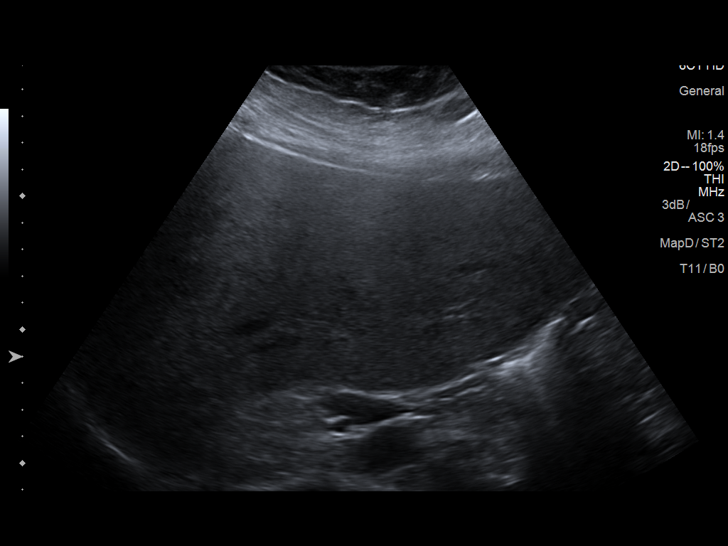
[im 35/47]
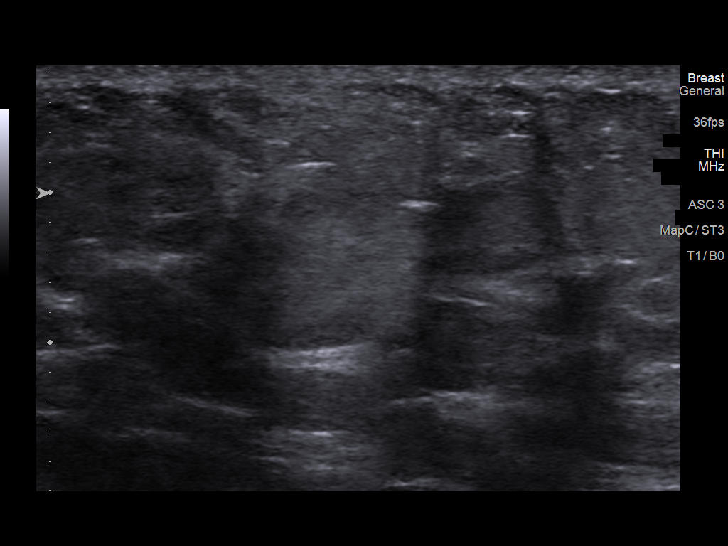
[im 39/47]
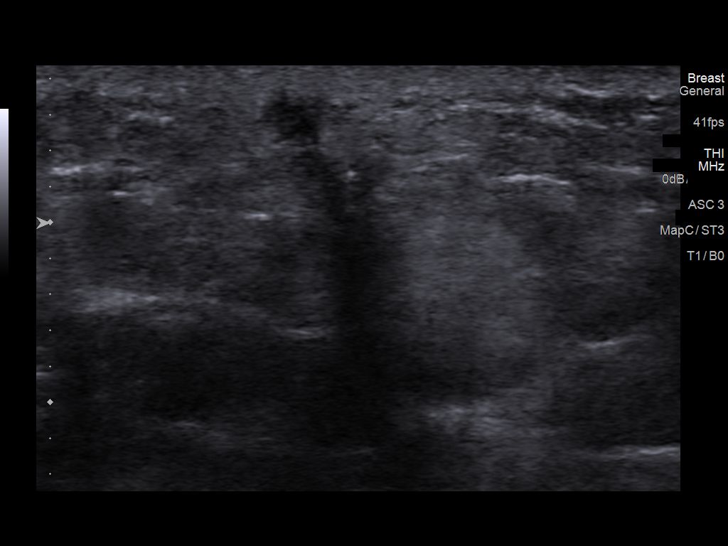
[im 43/47]
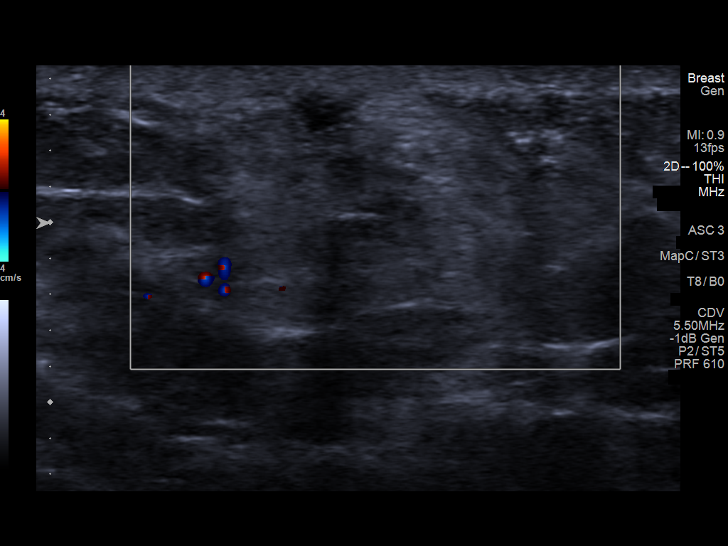
[im 47/47]
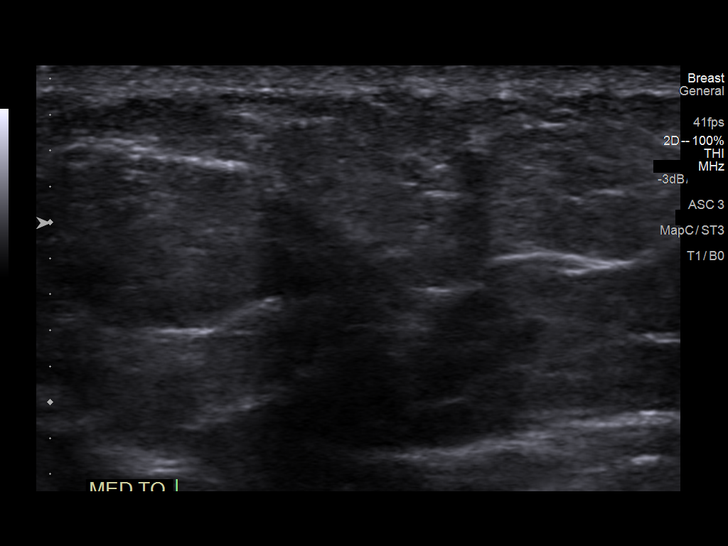

[14 of 25 positions shown; findings below may reference images not displayed]

FINDINGS: Gallbladder:

No gallstones or wall thickening visualized. No sonographic Murphy
sign noted by sonographer.

Common bile duct:

Diameter: Normal 3 mm

Liver:

No focal lesion identified. Within normal limits in parenchymal
echogenicity. Portal vein is patent on color Doppler imaging with
normal direction of blood flow towards the liver.

Evaluation of the subcutaneous tissue in the RIGHT upper quadrant
palpable region demonstrates no discrete measurable lesion.
IMPRESSION: 1. No discrete measurable lesion in the RIGHT upper quadrant at site
of palpable abnormality.
2. Normal liver and gallbladder.

## 2019-10-01 IMAGING — US US PELVIS COMPLETE
1 series · 13 of 25 positions shown · non-contrast
Comparison: 11/18/2006

CLINICAL DATA: Postmenopausal bleeding for 2 years, prior
endometrial ablation

EXAM:
TRANSABDOMINAL AND TRANSVAGINAL ULTRASOUND OF PELVIS
TECHNIQUE: Both transabdominal and transvaginal ultrasound examinations of the
pelvis were performed. Transabdominal technique was performed for
global imaging of the pelvis including uterus, ovaries, adnexal
regions, and pelvic cul-de-sac. It was necessary to proceed with
endovaginal exam following the transabdominal exam to visualize the
endometrium and ovaries.

[Series 1: us pelvis complete · 0.21mm/px · 13 of 95 slices shown]
[im 1/95]
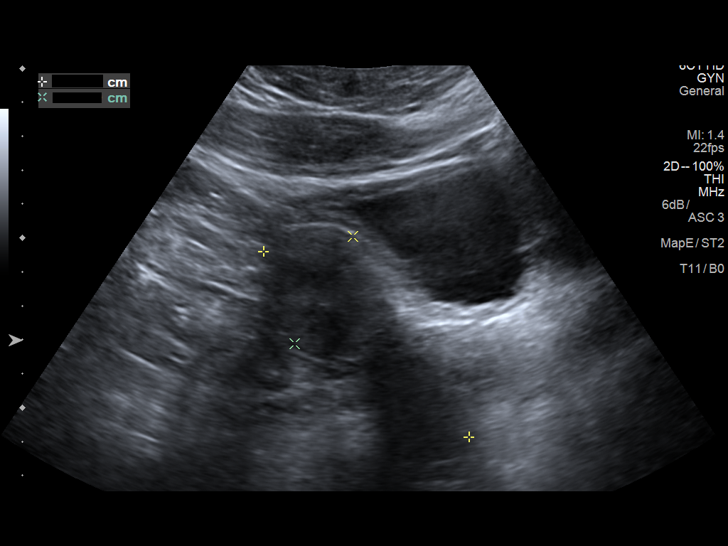
[im 8/95]
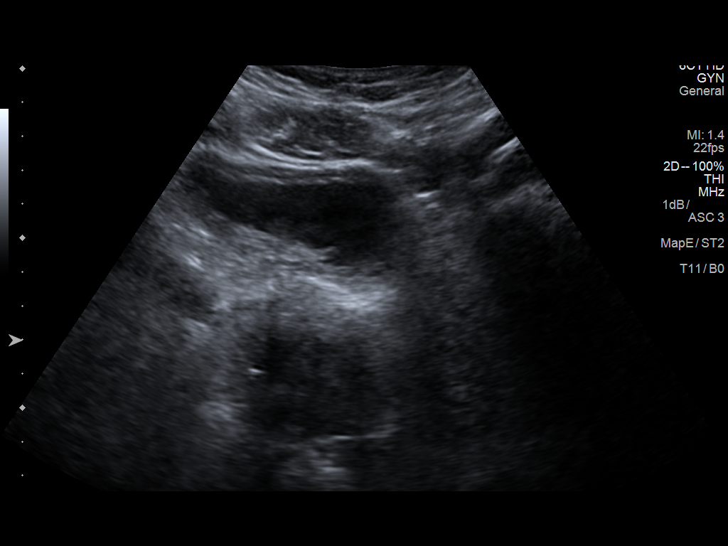
[im 16/95]
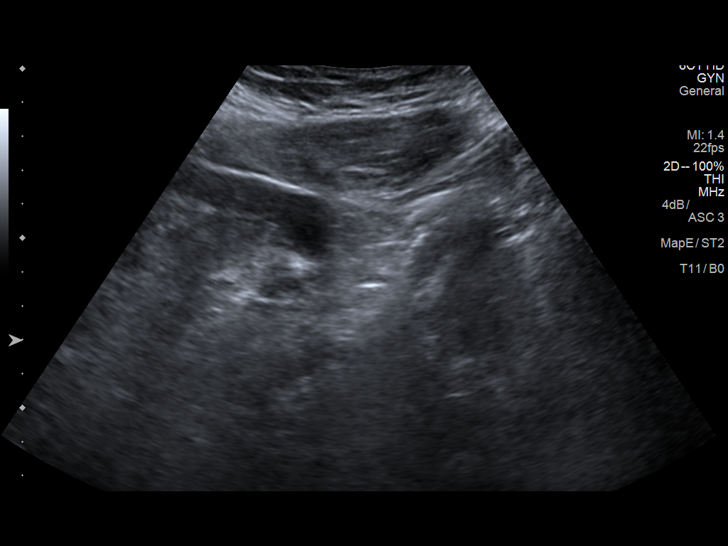
[im 24/95]
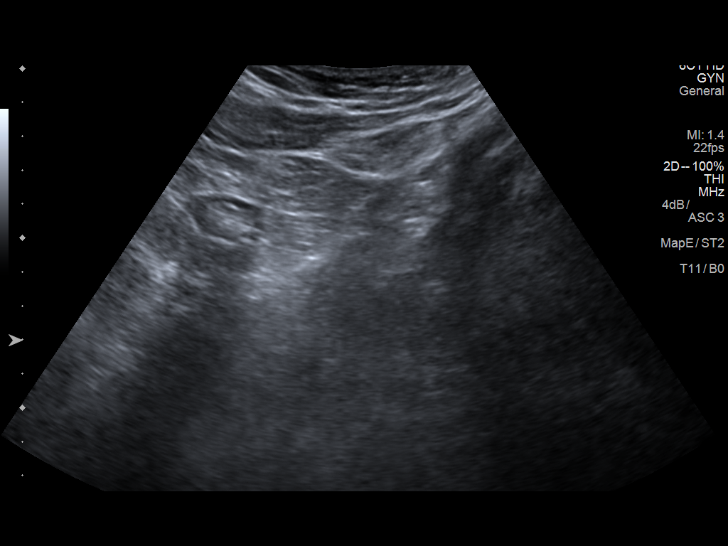
[im 32/95]
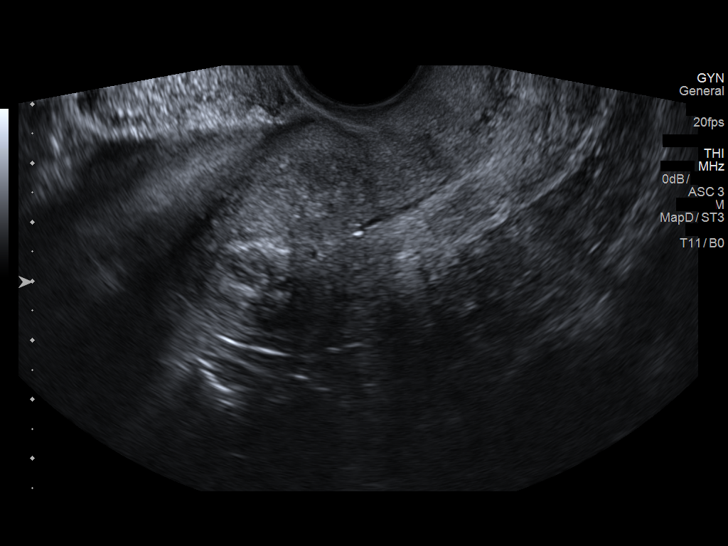
[im 40/95]
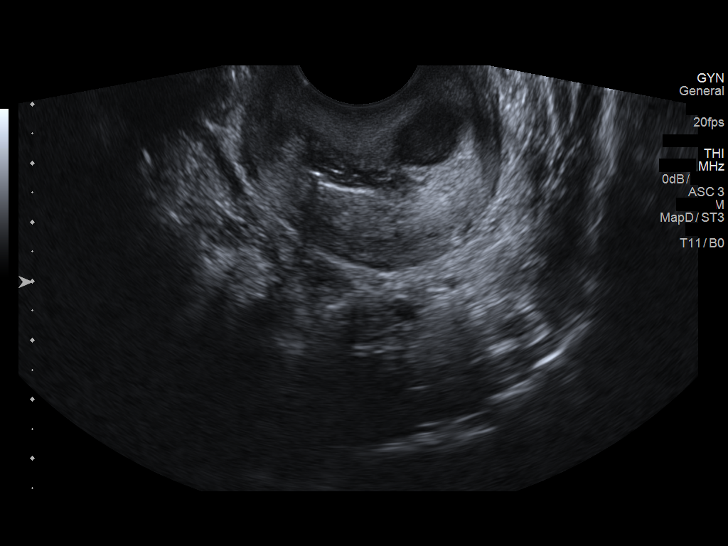
[im 48/95]
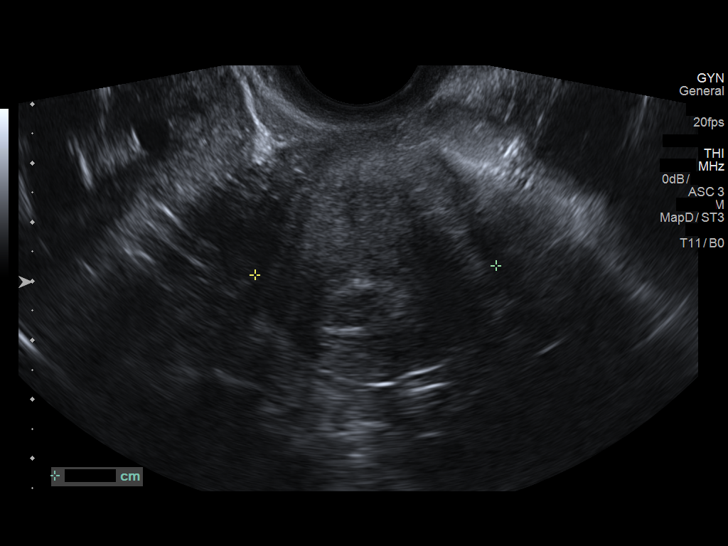
[im 55/95]
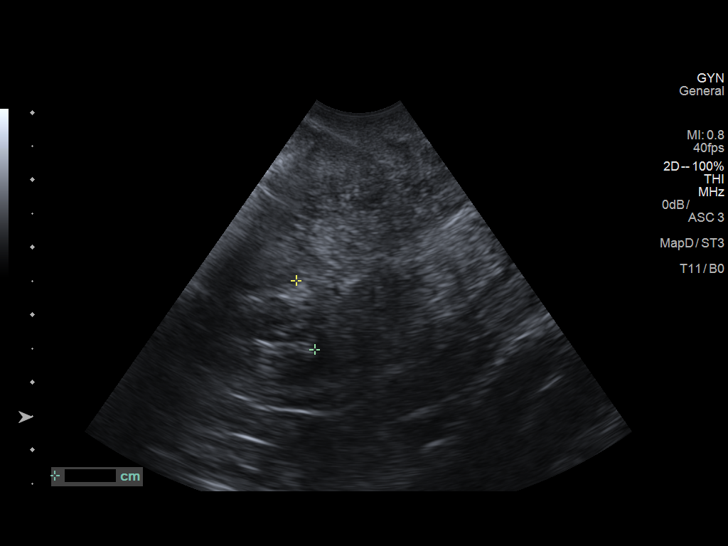
[im 63/95]
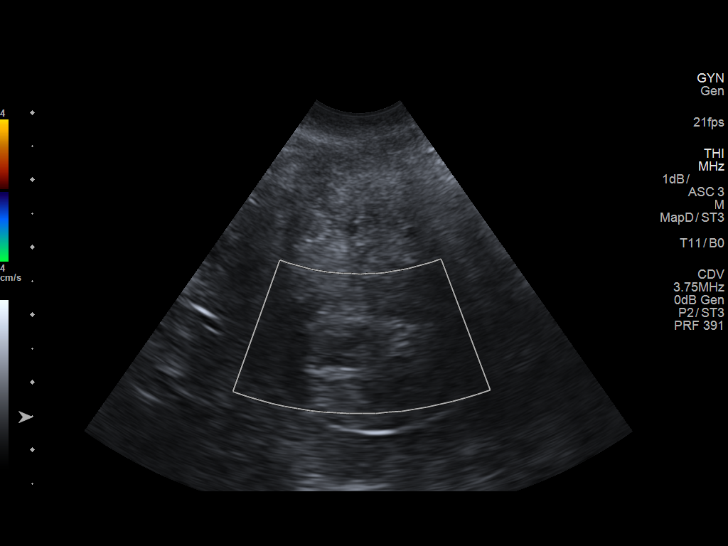
[im 71/95]
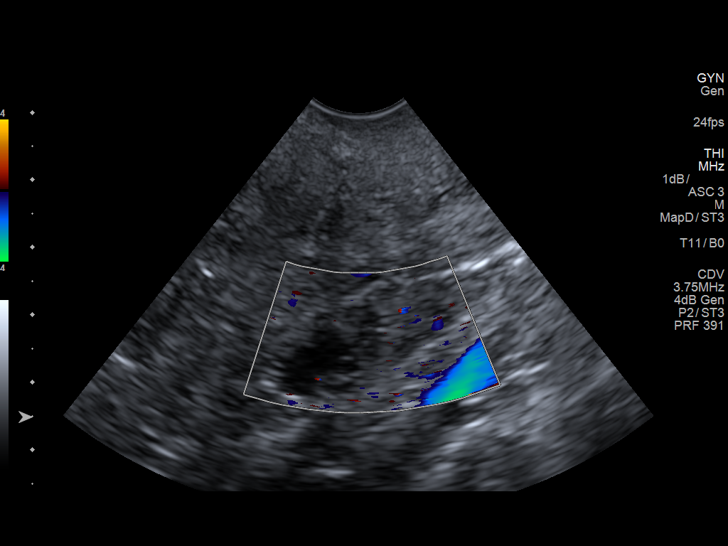
[im 79/95]
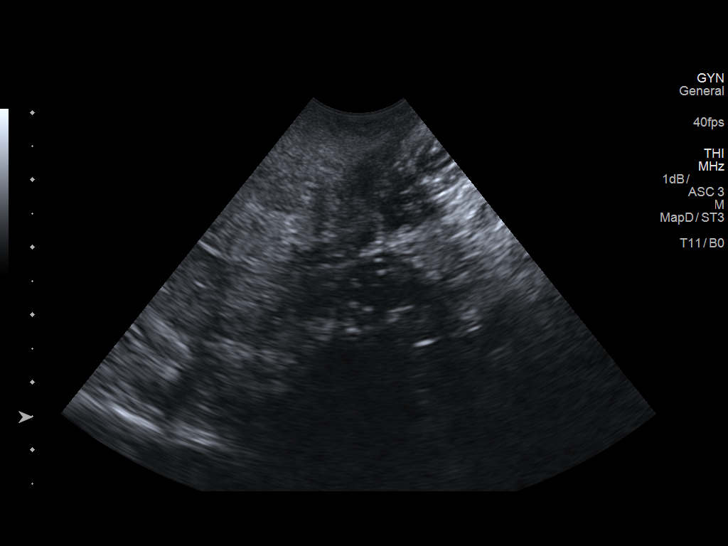
[im 87/95]
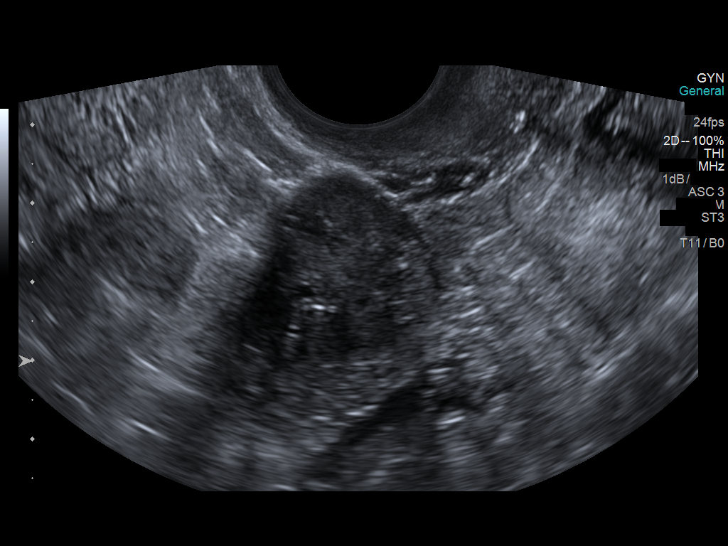
[im 95/95]
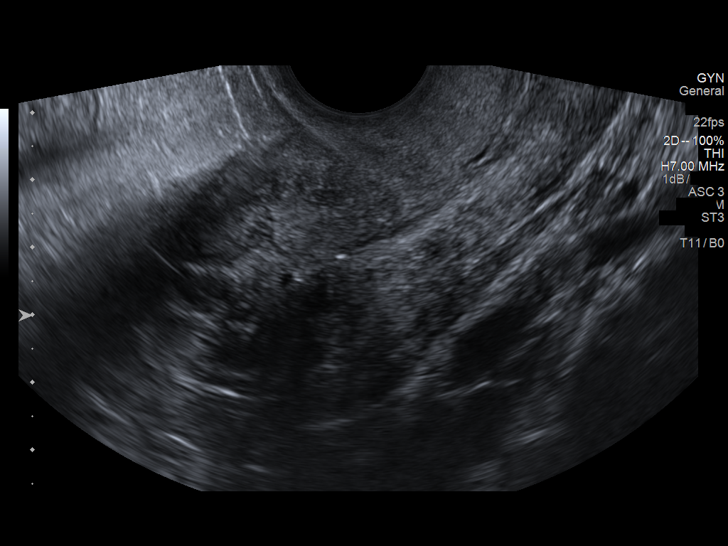

[13 of 25 positions shown; findings below may reference images not displayed]

FINDINGS: Uterus

Measurements: 8.6 x 3.8 x 4.1 cm. Mildly heterogeneous myometrial
echogenicity. Complicated nabothian cysts at cervix. Subtle
hypoechoic nodule at anterior LEFT uterus 10 x 7 x 7 mm. No
additional uterine masses.

Endometrium

Thickness: 11 mm thick. Suspected hypoechoic nodule within
endometrium at upper uterine segment 12 x 7 x 10 mm. No endometrial
fluid.

Right ovary

Measurements: 1.6 x 2.9 x 2.3 cm.  Normal morphology without mass

Left ovary

Measurements: 2.4 x 1.1 x 2.1 cm.  Normal morphology without mass

Other findings

Trace free pelvic fluid.  No adnexal masses.
IMPRESSION: Suspected small submucosal leiomyoma at the upper LEFT uterus 10 mm
diameter.

Abnormal thickening of the endometrial complex 11 mm thick with
potential 12 x 7 x 10 mm endometrial nodule at upper uterine
segment; in the setting of post-menopausal bleeding, endometrial
sampling is indicated to exclude carcinoma. If results are benign,
sonohysterogram should be considered for focal lesion work-up. (Ref:
Radiological Reasoning: Algorithmic Workup of Abnormal Vaginal
Bleeding with Endovaginal Sonography and Sonohysterography. AJR
[id number]; [id number]:[id number])

## 2020-03-24 IMAGING — CT CT ABD-PELV W/O CM
2 of 5 series · 15 of 46 positions shown, 17 images · non-contrast
Comparison: None.

CLINICAL DATA: Shortness of breath, chest, back and abdominal pain.

EXAM:
CT CHEST, ABDOMEN AND PELVIS WITHOUT CONTRAST
TECHNIQUE: Multidetector CT imaging of the chest, abdomen and pelvis was
performed following the standard protocol without IV contrast.

[Series 5: cap w/o 3.0 mm st cor · coronal · non-contrast · 0.77mm/px · 3 of 117 slices shown]
[im 39/117  soft-tissue]
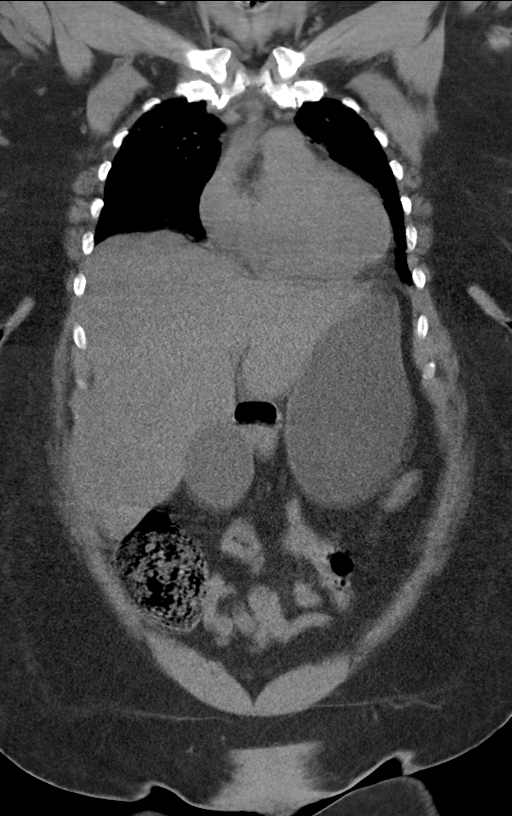
[im 52/117  soft-tissue]
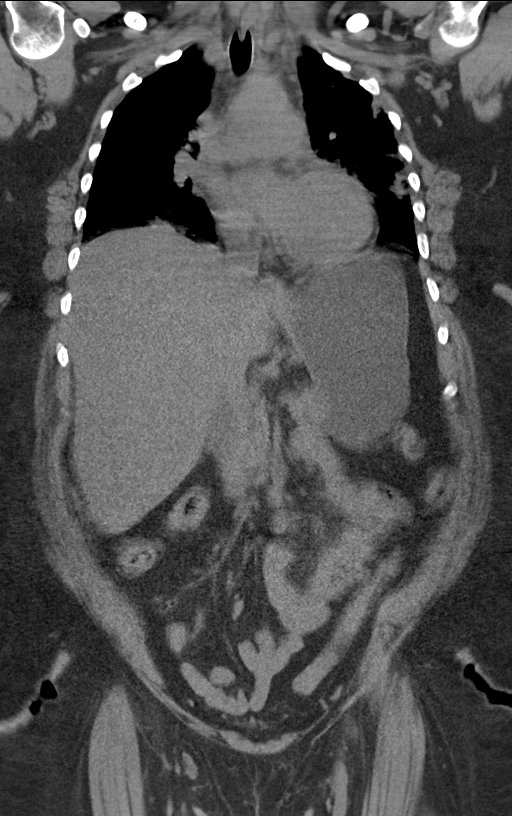
[im 65/117  soft-tissue]
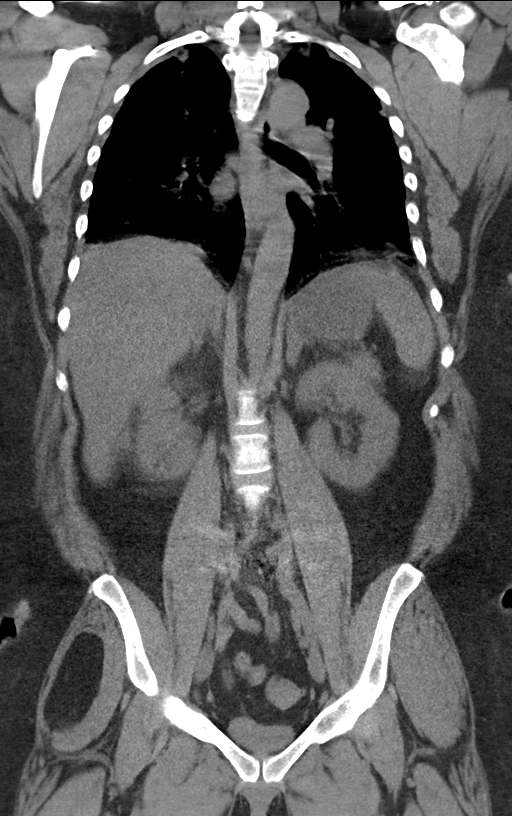

[Series 8: cap w/o 2.0 mm st · axial · non-contrast · 0.84mm/px · z∈[+701,+1251]mm · 12 of 313 slices shown, 14 images]
[im 19/313  soft-tissue]
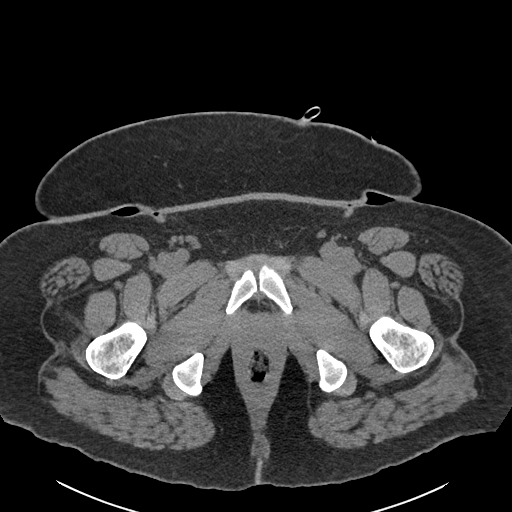
[im 19/313  bone]
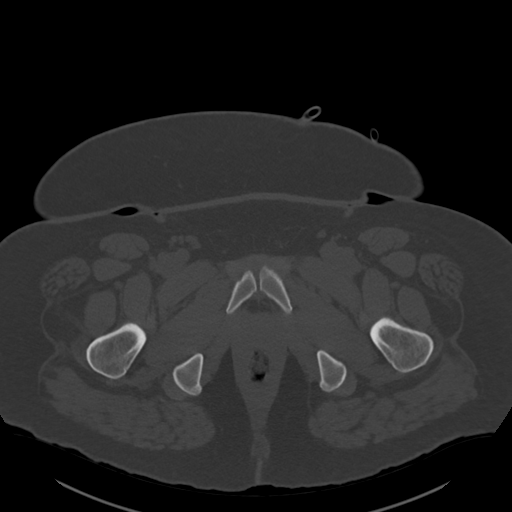
[im 56/313  soft-tissue]
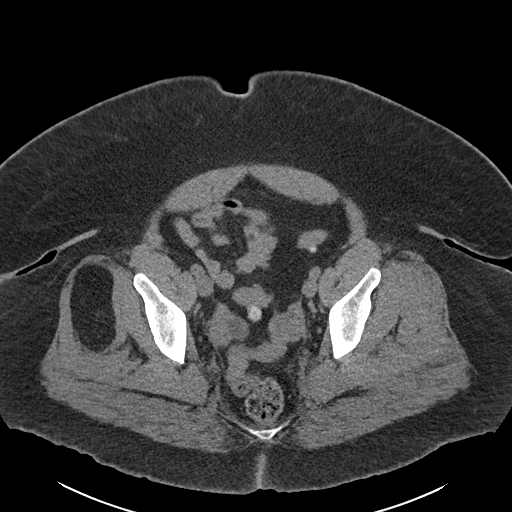
[im 74/313  soft-tissue]
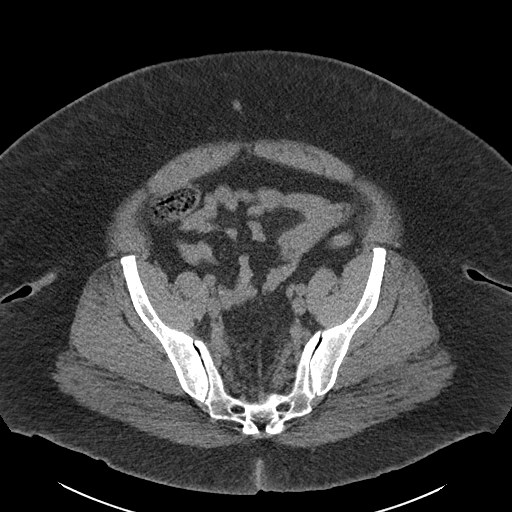
[im 92/313  soft-tissue]
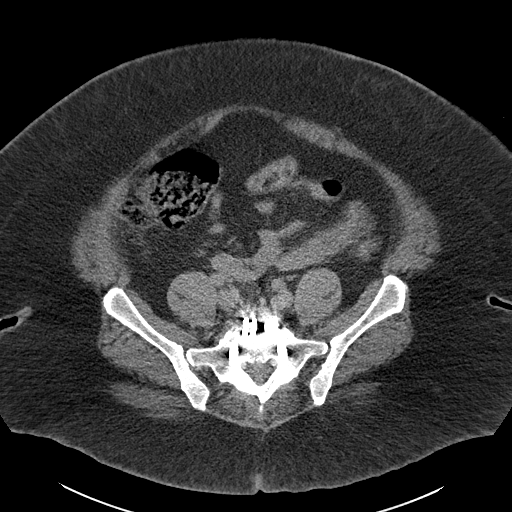
[im 129/313  soft-tissue]
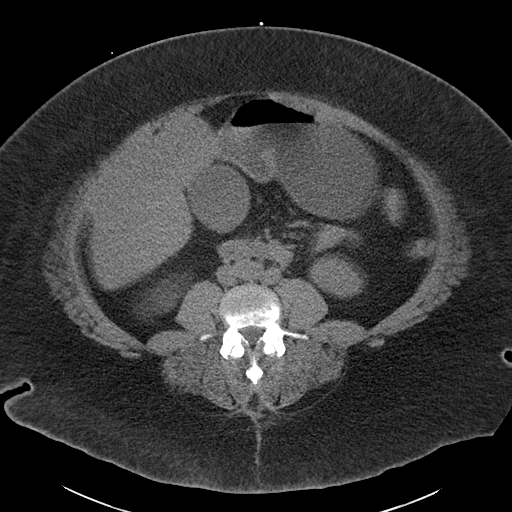
[im 147/313  soft-tissue]
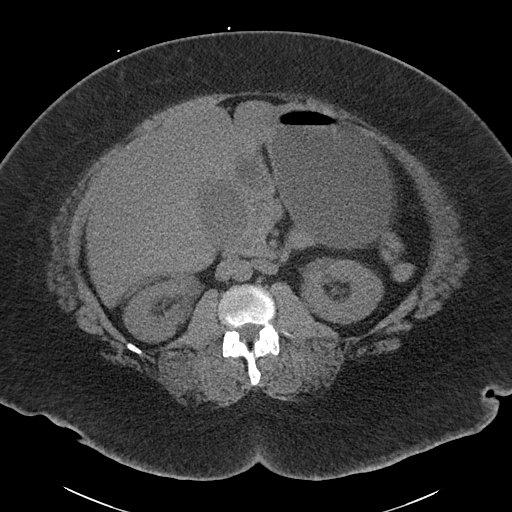
[im 166/313  soft-tissue]
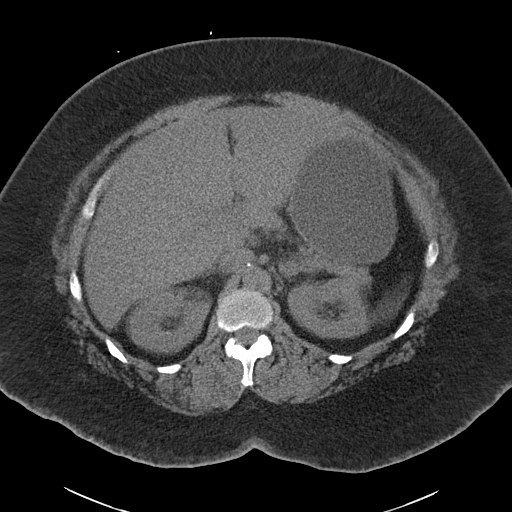
[im 202/313  soft-tissue]
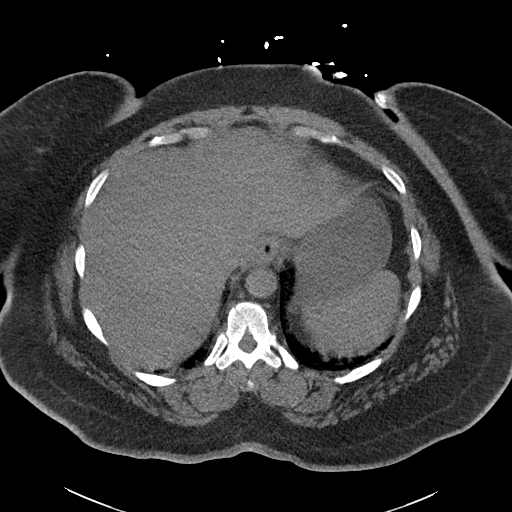
[im 221/313  soft-tissue]
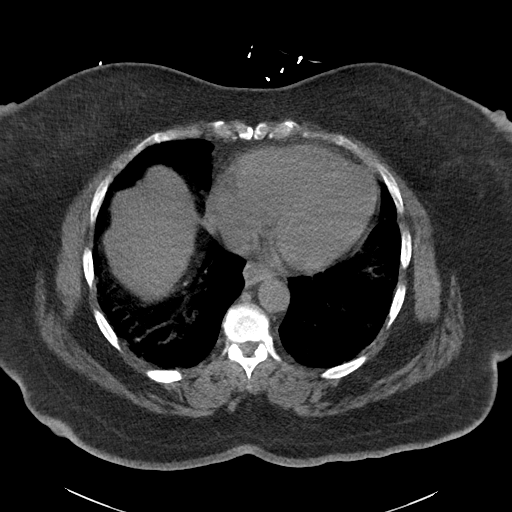
[im 221/313  bone]
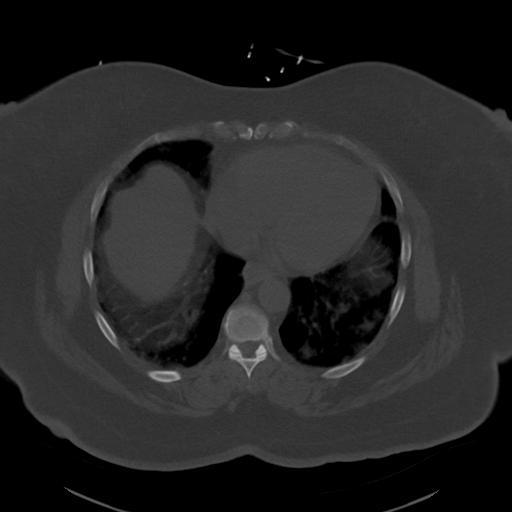
[im 239/313  soft-tissue]
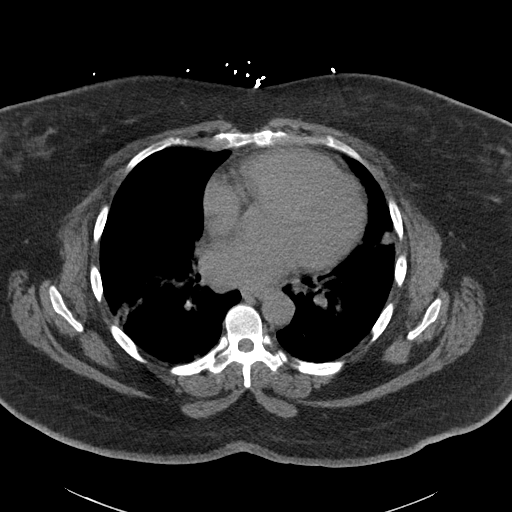
[im 276/313  soft-tissue]
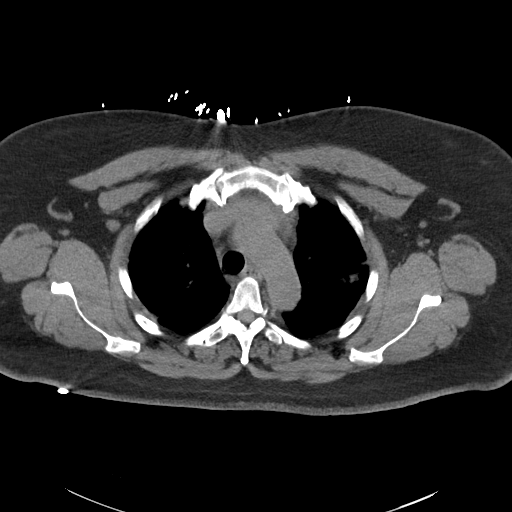
[im 294/313  soft-tissue]
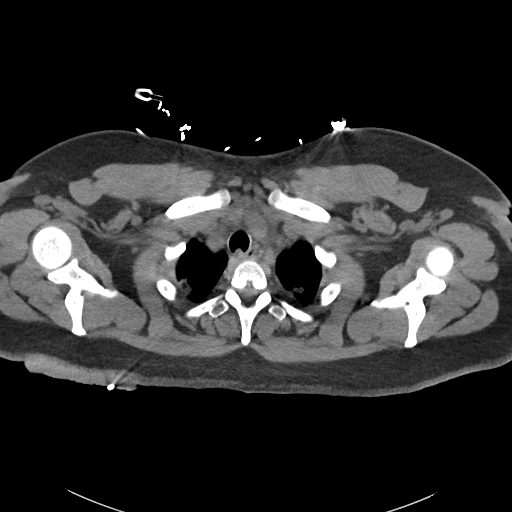

[15 of 46 positions shown; findings below may reference images not displayed]

FINDINGS: CT CHEST FINDINGS

Cardiovascular: No significant vascular findings. Normal heart size.
No pericardial effusion.

Mediastinum/Nodes: No enlarged mediastinal, hilar, or axillary lymph
nodes. Thyroid gland, trachea, and esophagus demonstrate no
significant findings.

Lungs/Pleura: No pleural effusion or pneumothorax. Bilateral
scattered nodular airspace disease throughout bilateral lungs.
Largest left upper lobe pulmonary nodule measures 14 mm.

Musculoskeletal: No acute osseous abnormality. No aggressive osseous
lesion.

CT ABDOMEN PELVIS FINDINGS

Hepatobiliary: No focal liver abnormality is seen. No gallstones,
gallbladder wall thickening, or biliary dilatation.

Pancreas: Unremarkable. No pancreatic ductal dilatation or
surrounding inflammatory changes.

Spleen: Normal in size without focal abnormality.

Adrenals/Urinary Tract: Adrenal glands are unremarkable. Kidneys are
normal, without renal calculi, focal lesion, or hydronephrosis.
Bladder is unremarkable.

Stomach/Bowel: Stomach is within normal limits. Appendix appears
normal. No evidence of bowel wall thickening, distention, or
inflammatory changes. Diverticulosis without evidence of
diverticulitis.

Vascular/Lymphatic: No significant vascular findings are present. No
enlarged abdominal or pelvic lymph nodes.

Reproductive: Status post hysterectomy. No adnexal masses.

Other: No abdominal wall hernia or abnormality. No abdominopelvic
ascites.

Musculoskeletal: No acute or significant osseous findings. 7.5 x
cm intramuscular lipoma in the right gluteus maximus muscle.
Posterior spinal fusion at L5-S1.
IMPRESSION: 1. Bilateral diffuse nodular airspace disease new compared with
06/30/2017. Differential considerations include metastatic disease
versus an infectious or inflammatory etiology including atypical
infection such as fungal infection.
